# Patient Record
Sex: Female | Born: 1945
Health system: Southern US, Community
[De-identification: ages and names within clinical notes are randomized; demographics above are authoritative.]

## PROBLEM LIST (undated history)

## (undated) DIAGNOSIS — C50919 Malignant neoplasm of unspecified site of unspecified female breast: Secondary | ICD-10-CM

## (undated) DIAGNOSIS — K5730 Diverticulosis of large intestine without perforation or abscess without bleeding: Secondary | ICD-10-CM

## (undated) DIAGNOSIS — I480 Paroxysmal atrial fibrillation: Secondary | ICD-10-CM

## (undated) DIAGNOSIS — I351 Nonrheumatic aortic (valve) insufficiency: Secondary | ICD-10-CM

## (undated) DIAGNOSIS — F419 Anxiety disorder, unspecified: Secondary | ICD-10-CM

## (undated) DIAGNOSIS — M8580 Other specified disorders of bone density and structure, unspecified site: Secondary | ICD-10-CM

## (undated) DIAGNOSIS — L259 Unspecified contact dermatitis, unspecified cause: Secondary | ICD-10-CM

## (undated) DIAGNOSIS — E785 Hyperlipidemia, unspecified: Secondary | ICD-10-CM

## (undated) DIAGNOSIS — I499 Cardiac arrhythmia, unspecified: Secondary | ICD-10-CM

## (undated) DIAGNOSIS — Z5181 Encounter for therapeutic drug level monitoring: Secondary | ICD-10-CM

## (undated) DIAGNOSIS — M1990 Unspecified osteoarthritis, unspecified site: Secondary | ICD-10-CM

## (undated) DIAGNOSIS — Z860100 Personal history of colon polyps, unspecified: Secondary | ICD-10-CM

## (undated) DIAGNOSIS — Z79899 Other long term (current) drug therapy: Secondary | ICD-10-CM

## (undated) DIAGNOSIS — Z923 Personal history of irradiation: Secondary | ICD-10-CM

## (undated) DIAGNOSIS — H3530 Unspecified macular degeneration: Secondary | ICD-10-CM

## (undated) DIAGNOSIS — K219 Gastro-esophageal reflux disease without esophagitis: Secondary | ICD-10-CM

## (undated) HISTORY — DX: Unspecified osteoarthritis, unspecified site: M19.90

## (undated) HISTORY — DX: Other specified disorders of bone density and structure, unspecified site: M85.80

## (undated) HISTORY — DX: Anxiety disorder, unspecified: F41.9

## (undated) HISTORY — DX: Paroxysmal atrial fibrillation: I48.0

## (undated) HISTORY — DX: Diverticulosis of large intestine without perforation or abscess without bleeding: K57.30

## (undated) HISTORY — DX: Personal history of colon polyps, unspecified: Z86.0100

## (undated) HISTORY — DX: Unspecified macular degeneration: H35.30

## (undated) HISTORY — DX: Gastro-esophageal reflux disease without esophagitis: K21.9

## (undated) HISTORY — DX: Unspecified contact dermatitis, unspecified cause: L25.9

## (undated) HISTORY — DX: Nonrheumatic aortic (valve) insufficiency: I35.1

## (undated) HISTORY — PX: OTHER SURGICAL HISTORY: SHX169

## (undated) HISTORY — DX: Hyperlipidemia, unspecified: E78.5

## (undated) HISTORY — DX: Malignant neoplasm of unspecified site of unspecified female breast: C50.919

## (undated) HISTORY — DX: Personal history of colonic polyps: Z86.010

---

## 1982-12-06 HISTORY — PX: ABDOMINAL HYSTERECTOMY: SHX81

## 1990-01-02 ENCOUNTER — Encounter: Payer: Self-pay | Admitting: Gastroenterology

## 1998-10-02 ENCOUNTER — Encounter: Admission: RE | Admit: 1998-10-02 | Discharge: 1998-10-02 | Payer: Self-pay | Admitting: *Deleted

## 1999-08-12 ENCOUNTER — Encounter (INDEPENDENT_AMBULATORY_CARE_PROVIDER_SITE_OTHER): Payer: Self-pay

## 1999-08-12 ENCOUNTER — Other Ambulatory Visit: Admission: RE | Admit: 1999-08-12 | Discharge: 1999-08-12 | Payer: Self-pay | Admitting: Obstetrics and Gynecology

## 2000-07-06 ENCOUNTER — Encounter: Payer: Self-pay | Admitting: Obstetrics and Gynecology

## 2000-07-06 ENCOUNTER — Encounter: Admission: RE | Admit: 2000-07-06 | Discharge: 2000-07-06 | Payer: Self-pay | Admitting: Obstetrics and Gynecology

## 2002-01-17 ENCOUNTER — Other Ambulatory Visit: Admission: RE | Admit: 2002-01-17 | Discharge: 2002-01-17 | Payer: Self-pay | Admitting: Obstetrics and Gynecology

## 2005-04-22 ENCOUNTER — Other Ambulatory Visit: Admission: RE | Admit: 2005-04-22 | Discharge: 2005-04-22 | Payer: Self-pay | Admitting: Obstetrics and Gynecology

## 2005-10-12 ENCOUNTER — Ambulatory Visit: Payer: Self-pay | Admitting: Pulmonary Disease

## 2006-01-19 ENCOUNTER — Ambulatory Visit (HOSPITAL_COMMUNITY): Admission: RE | Admit: 2006-01-19 | Discharge: 2006-01-19 | Payer: Self-pay | Admitting: Obstetrics and Gynecology

## 2006-04-22 ENCOUNTER — Ambulatory Visit: Payer: Self-pay | Admitting: Pulmonary Disease

## 2006-04-27 ENCOUNTER — Encounter: Payer: Self-pay | Admitting: Internal Medicine

## 2006-04-27 ENCOUNTER — Ambulatory Visit: Payer: Self-pay

## 2006-05-11 ENCOUNTER — Ambulatory Visit: Payer: Self-pay | Admitting: Pulmonary Disease

## 2006-05-11 ENCOUNTER — Ambulatory Visit: Payer: Self-pay | Admitting: Internal Medicine

## 2006-12-06 DIAGNOSIS — C50919 Malignant neoplasm of unspecified site of unspecified female breast: Secondary | ICD-10-CM

## 2006-12-06 DIAGNOSIS — Z923 Personal history of irradiation: Secondary | ICD-10-CM

## 2006-12-06 HISTORY — DX: Personal history of irradiation: Z92.3

## 2006-12-06 HISTORY — DX: Malignant neoplasm of unspecified site of unspecified female breast: C50.919

## 2007-01-26 ENCOUNTER — Ambulatory Visit: Payer: Self-pay | Admitting: Pulmonary Disease

## 2007-06-01 ENCOUNTER — Ambulatory Visit: Payer: Self-pay | Admitting: Pulmonary Disease

## 2007-06-01 LAB — CONVERTED CEMR LAB
ALT: 18 units/L (ref 0–35)
Basophils Relative: 0.1 % (ref 0.0–1.0)
Bilirubin, Direct: 0.1 mg/dL (ref 0.0–0.3)
CO2: 31 meq/L (ref 19–32)
Eosinophils Relative: 0.3 % (ref 0.0–5.0)
GFR calc Af Amer: 110 mL/min
Glucose, Bld: 97 mg/dL (ref 70–99)
HCT: 36.7 % (ref 36.0–46.0)
Hemoglobin: 12.4 g/dL (ref 12.0–15.0)
Lymphocytes Relative: 34.4 % (ref 12.0–46.0)
Monocytes Absolute: 0.7 10*3/uL (ref 0.2–0.7)
Neutro Abs: 3.5 10*3/uL (ref 1.4–7.7)
Potassium: 4.4 meq/L (ref 3.5–5.1)
Total Bilirubin: 0.9 mg/dL (ref 0.3–1.2)
Total Protein: 6.8 g/dL (ref 6.0–8.3)
VLDL: 17 mg/dL (ref 0–40)
Vit D, 1,25-Dihydroxy: 60 — ABNORMAL HIGH (ref 20–57)
WBC: 6.4 10*3/uL (ref 4.5–10.5)

## 2007-06-05 ENCOUNTER — Ambulatory Visit: Payer: Self-pay | Admitting: Pulmonary Disease

## 2007-08-07 HISTORY — PX: BREAST LUMPECTOMY: SHX2

## 2007-09-05 ENCOUNTER — Encounter (INDEPENDENT_AMBULATORY_CARE_PROVIDER_SITE_OTHER): Payer: Self-pay | Admitting: Diagnostic Radiology

## 2007-09-05 ENCOUNTER — Encounter: Admission: RE | Admit: 2007-09-05 | Discharge: 2007-09-05 | Payer: Self-pay | Admitting: Obstetrics and Gynecology

## 2007-09-13 ENCOUNTER — Encounter: Admission: RE | Admit: 2007-09-13 | Discharge: 2007-09-13 | Payer: Self-pay | Admitting: Obstetrics and Gynecology

## 2007-10-05 ENCOUNTER — Ambulatory Visit: Admission: RE | Admit: 2007-10-05 | Discharge: 2007-12-05 | Payer: Self-pay | Admitting: Radiation Oncology

## 2007-11-29 ENCOUNTER — Encounter: Payer: Self-pay | Admitting: Pulmonary Disease

## 2007-12-27 ENCOUNTER — Ambulatory Visit: Payer: Self-pay | Admitting: Oncology

## 2007-12-27 ENCOUNTER — Encounter: Payer: Self-pay | Admitting: Pulmonary Disease

## 2007-12-27 LAB — CBC WITH DIFFERENTIAL/PLATELET
BASO%: 0.4 % (ref 0.0–2.0)
Basophils Absolute: 0 10*3/uL (ref 0.0–0.1)
HCT: 35.1 % (ref 34.8–46.6)
HGB: 12.2 g/dL (ref 11.6–15.9)
MCHC: 34.8 g/dL (ref 32.0–36.0)
MONO#: 0.6 10*3/uL (ref 0.1–0.9)
NEUT%: 68.2 % (ref 39.6–76.8)
WBC: 5.7 10*3/uL (ref 3.9–10.0)
lymph#: 1.1 10*3/uL (ref 0.9–3.3)

## 2007-12-27 LAB — COMPREHENSIVE METABOLIC PANEL
ALT: 20 U/L (ref 0–35)
Albumin: 4.1 g/dL (ref 3.5–5.2)
CO2: 28 mEq/L (ref 19–32)
Calcium: 9.2 mg/dL (ref 8.4–10.5)
Chloride: 100 mEq/L (ref 96–112)
Creatinine, Ser: 0.62 mg/dL (ref 0.40–1.20)
Potassium: 3.6 mEq/L (ref 3.5–5.3)

## 2007-12-27 LAB — LACTATE DEHYDROGENASE: LDH: 140 U/L (ref 94–250)

## 2007-12-31 LAB — VITAMIN D PNL(25-HYDRXY+1,25-DIHY)-BLD
Vit D, 1,25-Dihydroxy: 52 pg/mL (ref 6–62)
Vit D, 25-Hydroxy: 32 ng/mL (ref 30–89)

## 2008-01-03 ENCOUNTER — Encounter: Payer: Self-pay | Admitting: Pulmonary Disease

## 2008-01-16 ENCOUNTER — Ambulatory Visit: Payer: Self-pay | Admitting: Gastroenterology

## 2008-01-26 ENCOUNTER — Ambulatory Visit: Payer: Self-pay | Admitting: Gastroenterology

## 2008-01-26 ENCOUNTER — Encounter: Payer: Self-pay | Admitting: Gastroenterology

## 2008-01-26 ENCOUNTER — Encounter: Payer: Self-pay | Admitting: Pulmonary Disease

## 2008-02-23 ENCOUNTER — Ambulatory Visit: Payer: Self-pay | Admitting: Oncology

## 2008-02-28 ENCOUNTER — Encounter: Payer: Self-pay | Admitting: Pulmonary Disease

## 2008-05-02 ENCOUNTER — Ambulatory Visit: Payer: Self-pay | Admitting: Oncology

## 2008-05-06 ENCOUNTER — Encounter: Payer: Self-pay | Admitting: Pulmonary Disease

## 2008-05-06 LAB — COMPREHENSIVE METABOLIC PANEL
AST: 17 U/L (ref 0–37)
Alkaline Phosphatase: 60 U/L (ref 39–117)
BUN: 12 mg/dL (ref 6–23)
Creatinine, Ser: 1.09 mg/dL (ref 0.40–1.20)
Glucose, Bld: 103 mg/dL — ABNORMAL HIGH (ref 70–99)
Total Bilirubin: 0.3 mg/dL (ref 0.3–1.2)

## 2008-05-06 LAB — CBC WITH DIFFERENTIAL/PLATELET
Basophils Absolute: 0 10*3/uL (ref 0.0–0.1)
EOS%: 0.2 % (ref 0.0–7.0)
HCT: 34.3 % — ABNORMAL LOW (ref 34.8–46.6)
HGB: 12.1 g/dL (ref 11.6–15.9)
LYMPH%: 25.7 % (ref 14.0–48.0)
MCH: 32.8 pg (ref 26.0–34.0)
MCV: 92.9 fL (ref 81.0–101.0)
MONO%: 10.9 % (ref 0.0–13.0)
NEUT%: 62.8 % (ref 39.6–76.8)
Platelets: 248 10*3/uL (ref 145–400)
RDW: 12.1 % (ref 11.3–14.5)

## 2008-05-06 LAB — CANCER ANTIGEN 27.29: CA 27.29: 21 U/mL (ref 0–39)

## 2008-05-08 ENCOUNTER — Encounter: Payer: Self-pay | Admitting: Pulmonary Disease

## 2008-06-21 ENCOUNTER — Ambulatory Visit: Payer: Self-pay | Admitting: Internal Medicine

## 2008-06-21 ENCOUNTER — Encounter: Payer: Self-pay | Admitting: Pulmonary Disease

## 2008-07-04 ENCOUNTER — Ambulatory Visit: Payer: Self-pay | Admitting: Internal Medicine

## 2008-07-04 DIAGNOSIS — L259 Unspecified contact dermatitis, unspecified cause: Secondary | ICD-10-CM

## 2008-07-04 DIAGNOSIS — J309 Allergic rhinitis, unspecified: Secondary | ICD-10-CM | POA: Insufficient documentation

## 2008-07-04 DIAGNOSIS — F411 Generalized anxiety disorder: Secondary | ICD-10-CM

## 2008-07-04 DIAGNOSIS — E785 Hyperlipidemia, unspecified: Secondary | ICD-10-CM | POA: Insufficient documentation

## 2008-07-04 DIAGNOSIS — K219 Gastro-esophageal reflux disease without esophagitis: Secondary | ICD-10-CM

## 2008-07-04 DIAGNOSIS — M8580 Other specified disorders of bone density and structure, unspecified site: Secondary | ICD-10-CM

## 2008-07-10 ENCOUNTER — Telehealth: Payer: Self-pay | Admitting: Pulmonary Disease

## 2008-09-06 ENCOUNTER — Ambulatory Visit: Payer: Self-pay | Admitting: Pulmonary Disease

## 2008-09-07 DIAGNOSIS — C50919 Malignant neoplasm of unspecified site of unspecified female breast: Secondary | ICD-10-CM

## 2008-09-07 DIAGNOSIS — K5730 Diverticulosis of large intestine without perforation or abscess without bleeding: Secondary | ICD-10-CM | POA: Insufficient documentation

## 2008-09-07 DIAGNOSIS — I359 Nonrheumatic aortic valve disorder, unspecified: Secondary | ICD-10-CM | POA: Insufficient documentation

## 2008-09-07 DIAGNOSIS — M1990 Unspecified osteoarthritis, unspecified site: Secondary | ICD-10-CM | POA: Insufficient documentation

## 2008-09-17 ENCOUNTER — Encounter: Payer: Self-pay | Admitting: Pulmonary Disease

## 2008-09-18 ENCOUNTER — Encounter: Payer: Self-pay | Admitting: Pulmonary Disease

## 2008-10-11 ENCOUNTER — Ambulatory Visit: Payer: Self-pay | Admitting: Oncology

## 2008-10-15 ENCOUNTER — Encounter: Payer: Self-pay | Admitting: Pulmonary Disease

## 2008-10-15 LAB — CBC WITH DIFFERENTIAL/PLATELET
BASO%: 0.2 % (ref 0.0–2.0)
HCT: 33.6 % — ABNORMAL LOW (ref 34.8–46.6)
MCHC: 34.4 g/dL (ref 32.0–36.0)
MONO#: 0.7 10*3/uL (ref 0.1–0.9)
NEUT%: 60.4 % (ref 39.6–76.8)
RBC: 3.54 10*6/uL — ABNORMAL LOW (ref 3.70–5.32)
RDW: 12.5 % (ref 11.3–14.5)
WBC: 5.7 10*3/uL (ref 3.9–10.0)
lymph#: 1.5 10*3/uL (ref 0.9–3.3)

## 2008-10-15 LAB — CANCER ANTIGEN 27.29: CA 27.29: 20 U/mL (ref 0–39)

## 2008-10-16 LAB — COMPREHENSIVE METABOLIC PANEL
ALT: 19 U/L (ref 0–35)
CO2: 30 mEq/L (ref 19–32)
Creatinine, Ser: 1.03 mg/dL (ref 0.40–1.20)
Total Bilirubin: 0.6 mg/dL (ref 0.3–1.2)

## 2008-11-01 LAB — BASIC METABOLIC PANEL
CO2: 28 mEq/L (ref 19–32)
Chloride: 103 mEq/L (ref 96–112)
Creatinine, Ser: 0.63 mg/dL (ref 0.40–1.20)
Potassium: 4 mEq/L (ref 3.5–5.3)

## 2009-05-02 ENCOUNTER — Ambulatory Visit: Payer: Self-pay | Admitting: Oncology

## 2009-05-07 ENCOUNTER — Encounter: Payer: Self-pay | Admitting: Gastroenterology

## 2009-05-07 LAB — COMPREHENSIVE METABOLIC PANEL
CO2: 33 mEq/L — ABNORMAL HIGH (ref 19–32)
Glucose, Bld: 94 mg/dL (ref 70–99)
Sodium: 138 mEq/L (ref 135–145)
Total Bilirubin: 0.6 mg/dL (ref 0.3–1.2)
Total Protein: 7.2 g/dL (ref 6.0–8.3)

## 2009-05-07 LAB — CBC WITH DIFFERENTIAL/PLATELET
Eosinophils Absolute: 0 10*3/uL (ref 0.0–0.5)
HCT: 35.7 % (ref 34.8–46.6)
HGB: 12.4 g/dL (ref 11.6–15.9)
LYMPH%: 30.8 % (ref 14.0–49.7)
MONO#: 0.7 10*3/uL (ref 0.1–0.9)
NEUT#: 3.4 10*3/uL (ref 1.5–6.5)
NEUT%: 57.4 % (ref 38.4–76.8)
Platelets: 241 10*3/uL (ref 145–400)
RBC: 3.76 10*6/uL (ref 3.70–5.45)
WBC: 6 10*3/uL (ref 3.9–10.3)

## 2009-05-07 LAB — CANCER ANTIGEN 27.29: CA 27.29: 24 U/mL (ref 0–39)

## 2009-08-04 DIAGNOSIS — Z860100 Personal history of colon polyps, unspecified: Secondary | ICD-10-CM | POA: Insufficient documentation

## 2009-08-07 ENCOUNTER — Ambulatory Visit: Payer: Self-pay | Admitting: Gastroenterology

## 2009-08-07 DIAGNOSIS — K625 Hemorrhage of anus and rectum: Secondary | ICD-10-CM | POA: Insufficient documentation

## 2009-09-22 ENCOUNTER — Ambulatory Visit: Payer: Self-pay | Admitting: Oncology

## 2009-09-24 ENCOUNTER — Ambulatory Visit (HOSPITAL_COMMUNITY): Admission: RE | Admit: 2009-09-24 | Discharge: 2009-09-24 | Payer: Self-pay | Admitting: Oncology

## 2009-11-04 ENCOUNTER — Ambulatory Visit: Payer: Self-pay | Admitting: Oncology

## 2009-11-06 ENCOUNTER — Encounter: Payer: Self-pay | Admitting: Gastroenterology

## 2009-11-17 ENCOUNTER — Telehealth: Payer: Self-pay | Admitting: Pulmonary Disease

## 2010-01-27 ENCOUNTER — Ambulatory Visit: Payer: Self-pay | Admitting: Pulmonary Disease

## 2010-01-28 ENCOUNTER — Ambulatory Visit: Payer: Self-pay | Admitting: Pulmonary Disease

## 2010-01-29 LAB — CONVERTED CEMR LAB
AST: 26 units/L (ref 0–37)
Albumin: 3.9 g/dL (ref 3.5–5.2)
Alkaline Phosphatase: 67 units/L (ref 39–117)
BUN: 11 mg/dL (ref 6–23)
Basophils Absolute: 0 10*3/uL (ref 0.0–0.1)
Bilirubin, Direct: 0.2 mg/dL (ref 0.0–0.3)
Calcium: 9.5 mg/dL (ref 8.4–10.5)
Cholesterol: 162 mg/dL (ref 0–200)
Creatinine, Ser: 0.7 mg/dL (ref 0.4–1.2)
Eosinophils Absolute: 0 10*3/uL (ref 0.0–0.7)
HCT: 37 % (ref 36.0–46.0)
Hemoglobin: 12.4 g/dL (ref 12.0–15.0)
LDL Cholesterol: 72 mg/dL (ref 0–99)
Lymphocytes Relative: 27 % (ref 12.0–46.0)
Lymphs Abs: 1.3 10*3/uL (ref 0.7–4.0)
MCHC: 33.5 g/dL (ref 30.0–36.0)
Monocytes Relative: 10.5 % (ref 3.0–12.0)
Neutro Abs: 3 10*3/uL (ref 1.4–7.7)
Platelets: 215 10*3/uL (ref 150.0–400.0)
RDW: 11.5 % (ref 11.5–14.6)
TSH: 1.34 microintl units/mL (ref 0.35–5.50)
Total Protein: 7.1 g/dL (ref 6.0–8.3)
Triglycerides: 89 mg/dL (ref 0.0–149.0)

## 2010-10-14 ENCOUNTER — Encounter: Payer: Self-pay | Admitting: Pulmonary Disease

## 2010-10-16 ENCOUNTER — Ambulatory Visit: Payer: Self-pay | Admitting: Oncology

## 2010-10-20 ENCOUNTER — Encounter: Payer: Self-pay | Admitting: Gastroenterology

## 2010-10-20 LAB — CBC WITH DIFFERENTIAL/PLATELET
BASO%: 0.3 % (ref 0.0–2.0)
EOS%: 0.1 % (ref 0.0–7.0)
LYMPH%: 34.6 % (ref 14.0–49.7)
MCH: 33 pg (ref 25.1–34.0)
MCHC: 34.8 g/dL (ref 31.5–36.0)
MCV: 95 fL (ref 79.5–101.0)
MONO%: 10.7 % (ref 0.0–14.0)
NEUT#: 2.6 10*3/uL (ref 1.5–6.5)
Platelets: 254 10*3/uL (ref 145–400)
RBC: 3.72 10*6/uL (ref 3.70–5.45)
RDW: 12.6 % (ref 11.2–14.5)

## 2010-10-20 LAB — COMPREHENSIVE METABOLIC PANEL
ALT: 16 U/L (ref 0–35)
AST: 20 U/L (ref 0–37)
Albumin: 4.2 g/dL (ref 3.5–5.2)
Alkaline Phosphatase: 64 U/L (ref 39–117)
Glucose, Bld: 126 mg/dL — ABNORMAL HIGH (ref 70–99)
Potassium: 4.1 mEq/L (ref 3.5–5.3)
Sodium: 140 mEq/L (ref 135–145)
Total Bilirubin: 0.5 mg/dL (ref 0.3–1.2)
Total Protein: 6.5 g/dL (ref 6.0–8.3)

## 2010-12-27 ENCOUNTER — Encounter: Payer: Self-pay | Admitting: Surgery

## 2011-01-03 LAB — CONVERTED CEMR LAB
ALT: 22 units/L (ref 0–35)
AST: 24 units/L (ref 0–37)
Albumin: 3.9 g/dL (ref 3.5–5.2)
Alkaline Phosphatase: 52 units/L (ref 39–117)
BUN: 17 mg/dL (ref 6–23)
Basophils Relative: 0.5 % (ref 0.0–3.0)
CO2: 31 meq/L (ref 19–32)
Chloride: 105 meq/L (ref 96–112)
Creatinine, Ser: 0.7 mg/dL (ref 0.4–1.2)
Eosinophils Absolute: 0 10*3/uL (ref 0.0–0.7)
Eosinophils Relative: 0.1 % (ref 0.0–5.0)
Glucose, Bld: 101 mg/dL — ABNORMAL HIGH (ref 70–99)
Lymphocytes Relative: 26 % (ref 12.0–46.0)
MCV: 96.3 fL (ref 78.0–100.0)
Monocytes Relative: 10.9 % (ref 3.0–12.0)
Neutrophils Relative %: 62.5 % (ref 43.0–77.0)
Potassium: 4.6 meq/L (ref 3.5–5.1)
RBC: 3.72 M/uL — ABNORMAL LOW (ref 3.87–5.11)
TSH: 1.36 microintl units/mL (ref 0.35–5.50)
Total CHOL/HDL Ratio: 2.4
Total Protein: 6.6 g/dL (ref 6.0–8.3)
VLDL: 13 mg/dL (ref 0–40)
WBC: 6.5 10*3/uL (ref 4.5–10.5)
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
## 2011-01-05 NOTE — Letter (Signed)
Summary: [facility]  [facility]   Imported By: Lester [place] 11/04/2010 08:29:07  _____________________________________________________________________  External Attachment:    Type:   Image     Comment:   External Document

## 2011-01-27 ENCOUNTER — Ambulatory Visit: Payer: Self-pay | Admitting: Pulmonary Disease

## 2011-01-29 ENCOUNTER — Other Ambulatory Visit: Payer: Self-pay | Admitting: Pulmonary Disease

## 2011-01-29 ENCOUNTER — Encounter (INDEPENDENT_AMBULATORY_CARE_PROVIDER_SITE_OTHER): Payer: Self-pay | Admitting: *Deleted

## 2011-01-29 ENCOUNTER — Ambulatory Visit: Payer: Self-pay | Admitting: Pulmonary Disease

## 2011-01-29 ENCOUNTER — Other Ambulatory Visit: Payer: PRIVATE HEALTH INSURANCE

## 2011-01-29 DIAGNOSIS — Z0000 Encounter for general adult medical examination without abnormal findings: Secondary | ICD-10-CM

## 2011-01-29 LAB — HEPATIC FUNCTION PANEL
ALT: 18 U/L (ref 0–35)
Alkaline Phosphatase: 66 U/L (ref 39–117)
Bilirubin, Direct: 0.1 mg/dL (ref 0.0–0.3)
Total Protein: 6.9 g/dL (ref 6.0–8.3)

## 2011-01-29 LAB — CBC WITH DIFFERENTIAL/PLATELET
Basophils Absolute: 0 10*3/uL (ref 0.0–0.1)
Basophils Relative: 0.7 % (ref 0.0–3.0)
Eosinophils Absolute: 0 10*3/uL (ref 0.0–0.7)
MCHC: 34.7 g/dL (ref 30.0–36.0)
MCV: 97.6 fl (ref 78.0–100.0)
Monocytes Absolute: 0.7 10*3/uL (ref 0.1–1.0)
Neutrophils Relative %: 51.1 % (ref 43.0–77.0)
RBC: 3.96 Mil/uL (ref 3.87–5.11)
RDW: 12.5 % (ref 11.5–14.6)

## 2011-01-29 LAB — URINALYSIS
Bilirubin Urine: NEGATIVE
Hgb urine dipstick: NEGATIVE
Total Protein, Urine: NEGATIVE
Urine Glucose: NEGATIVE

## 2011-01-29 LAB — BASIC METABOLIC PANEL
CO2: 32 mEq/L (ref 19–32)
Chloride: 106 mEq/L (ref 96–112)
Creatinine, Ser: 0.7 mg/dL (ref 0.4–1.2)
Glucose, Bld: 89 mg/dL (ref 70–99)
Sodium: 144 mEq/L (ref 135–145)

## 2011-01-29 LAB — LIPID PANEL
Total CHOL/HDL Ratio: 2
Triglycerides: 66 mg/dL (ref 0.0–149.0)

## 2011-02-02 ENCOUNTER — Encounter: Payer: Self-pay | Admitting: Cardiology

## 2011-02-10 ENCOUNTER — Ambulatory Visit (INDEPENDENT_AMBULATORY_CARE_PROVIDER_SITE_OTHER): Payer: PRIVATE HEALTH INSURANCE | Admitting: Pulmonary Disease

## 2011-02-10 ENCOUNTER — Encounter: Payer: Self-pay | Admitting: Pulmonary Disease

## 2011-02-10 DIAGNOSIS — I4891 Unspecified atrial fibrillation: Secondary | ICD-10-CM | POA: Insufficient documentation

## 2011-02-10 DIAGNOSIS — Z0000 Encounter for general adult medical examination without abnormal findings: Secondary | ICD-10-CM

## 2011-02-12 ENCOUNTER — Encounter: Payer: Self-pay | Admitting: Cardiology

## 2011-02-12 ENCOUNTER — Institutional Professional Consult (permissible substitution) (INDEPENDENT_AMBULATORY_CARE_PROVIDER_SITE_OTHER): Payer: PRIVATE HEALTH INSURANCE | Admitting: Cardiology

## 2011-02-12 DIAGNOSIS — I4891 Unspecified atrial fibrillation: Secondary | ICD-10-CM

## 2011-02-15 ENCOUNTER — Telehealth (INDEPENDENT_AMBULATORY_CARE_PROVIDER_SITE_OTHER): Payer: Self-pay | Admitting: *Deleted

## 2011-02-16 NOTE — Assessment & Plan Note (Signed)
Summary: consult: pas. per libby from [facility].notes on emr. caroli...   Visit Type:  Initial Consult Referring Provider:  n/a Primary Provider:  Lorin Picket Nadel,MD  CC:  Atrial Fibrillation.  History of Present Illness: The patient presents for evaluation of atrial fibrillation. She was hospitalized recently apparently [facility] with this. I have none of these records..  She reports having a a stress perfusion study which she says was negative. She apparently spontaneously converted to sinus rhythm. She denies any presyncope or syncope. She never had chest discomfort, neck or arm discomfort. Of note for several days prior to this she was having GI complaints and diarrhea.    Otherwise she has not had a cardiac history. She is active. She did have an echocardiogram in 2007 and there was trivial mitral regurgitation noted.  Current Medications (verified): 1)  Bayer Aspirin Ec Low Dose 81 Mg  Tbec (Aspirin) .... Take 1 Tablet By Mouth Once A Day 2)  Metoprolol Tartrate 25 Mg Tabs (Metoprolol Tartrate) .... 1/2 Tablet Twice Daily 3)  Simvastatin 20 Mg Tabs (Simvastatin) .... Take 1 Tablet By Mouth Once A Day At Bedtime.Marland KitchenMarland Kitchen 4)  Fish Oil   Oil (Fish Oil) .... Take 1 Capsule By Mouth Twice A Day 5)  Prilosec 20 Mg Cpdr (Omeprazole) .... Take 1 Tab By Mouth Once Daily As Needed For Acid Reflux.Marland KitchenMarland Kitchen 6)  Centrum Silver   Tabs (Multiple Vitamins-Minerals) .... Take 1 Tablet By Mouth Once A Day 7)  Vitamin D3 1000 Unit Caps (Cholecalciferol) .... Take 1 Cap By Mouth Once Daily.Marland KitchenMarland Kitchen 8)  Gabapentin 300 Mg Caps (Gabapentin) .... Take One Capsule By Mouth Once Daily As Needed For Hot Flashes  Allergies (verified): 1)  ! Codeine  Past History:  Past Medical History: ALLERGIC RHINITIS (ICD-477.9) AORTIC INSUFFICIENCY, MILD (ICD-424.1) HYPERLIPIDEMIA (ICD-272.4) GERD (ICD-530.81) DIVERTICULOSIS OF COLON (ICD-562.10) COLONIC POLYPS, HYPERPLASTIC, HX OF (ICD-V12.72) ADENOCARCINOMA, BREAST  (ICD-174.9) DEGENERATIVE JOINT DISEASE (ICD-715.90) OSTEOPENIA (ICD-733.90) ANXIETY (ICD-300.00) CONTACT DERMATITIS (ICD-692.9)  Past Surgical History: Hysterectomy and 1 ovary in 1984 Lleft breast lumpectomy & node bx 9/08  Family History: Reviewed history from 08/07/2009 and no changes required. Father deceased age 30 from heart attack Mother deceased age 65 from blood disorder, valve replaced 3 Siblings-  1 Sister died w/ lymphoma at age 72 1 Sister w/ HBP 1 Brother in good general health No FH of Colon Cancer:  Social History: Reviewed history from 09/06/2008 and no changes required. married 1 son - Fredrik Cove former smoker--quit smoking in 1979 exercises--3 times per week no caffeine use no etoh  Review of Systems       As stated in the HPI and negative for all other systems.   Vital Signs:  Patient profile:   65 year old female Height:      63 inches Weight:      119 pounds BMI:     21.16 Pulse rate:   60 / minute Resp:     16 per minute BP sitting:   123 / 75  (right arm)  Vitals Entered By: Marrion Coy, CNA (February 12, 2011 3:18 PM)  Physical Exam  General:  Well developed, well nourished, no acute distress.healthy appearing.   Head:  Normocephalic and atraumatic. Eyes:  PERRLA/EOM intact; conjunctiva and lids normal. Mouth:  Teeth, gums and palate normal. Oral mucosa normal. Neck:  Neck supple, no JVD. No masses, thyromegaly or abnormal cervical nodes. Chest Wall:  no deformities or breast masses noted Lungs:  Clear bilaterally to auscultation and  percussion. Abdomen:  Bowel sounds positive; abdomen soft and non-tender without masses, organomegaly, or hernias noted. No hepatosplenomegaly. Msk:  Back normal, normal gait. Muscle strength and tone normal. Extremities:  No clubbing or cyanosis. Neurologic:  Alert and oriented x 3. Skin:  Intact without lesions or rashes. Cervical Nodes:  no significant adenopathy Inguinal Nodes:  no significant  adenopathy Psych:  Normal affect.   Detailed Cardiovascular Exam  Neck    Carotids: Carotids full and equal bilaterally without bruits.      Neck Veins: Normal, no JVD.    Heart    Inspection: no deformities or lifts noted.      Palpation: normal PMI with no thrills palpable.      Auscultation: S1 and S2 within normal limits, no S3, no S4, questionable click, no murmurs  Vascular    Abdominal Aorta: no palpable masses, pulsations, or audible bruits.      Femoral Pulses: normal femoral pulses bilaterally.      Pedal Pulses: normal pedal pulses bilaterally.      Radial Pulses: normal radial pulses bilaterally.      Peripheral Circulation: no clubbing, cyanosis, or edema noted with normal capillary refill.     EKG  Procedure date:  02/12/2011  Findings:      Sinus rhythm, rate 60, axis within normal limits, intervals within normal limits, poor anterior R-wave progression  Impression & Recommendations:  Problem # 1:  PAROXYSMAL ATRIAL FIBRILLATION (ICD-427.31) I suspect this could to her GI illness. She has low thromboembolic risk for anticoagulation would not be needed. I will check an echocardiogram given the mitral valve regurgitation previously and possible click. However, no further treatment will be necessary unless she has recurrent symptoms. Orders: EKG w/ Interpretation (93000) Echocardiogram (Echo)  Problem # 2:  HYPERLIPIDEMIA (ICD-272.4) Per Dr. Kriste Basque.  I will not change her Zocor.  Patient Instructions: 1)  Your physician recommends that you continue on your current medications as directed. Please refer to the Current Medication list given to you today. 2)  Your physician has requested that you have an echocardiogram.  Echocardiography is a painless test that uses sound waves to create images of your heart. It provides your doctor with information about the size and shape of your heart and how well your heart's chambers and valves are working.  This procedure takes  approximately one hour. There are no restrictions for this procedure.

## 2011-02-23 NOTE — Progress Notes (Signed)
  ROI Faxed to [facility]. [facility]  February 15, 2011 9:13 AM     Appended Document:  Records Received from [facility] gave to [facility]
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
## 2011-03-04 ENCOUNTER — Other Ambulatory Visit (HOSPITAL_COMMUNITY): Payer: Self-pay | Admitting: Radiology

## 2011-03-04 DIAGNOSIS — I4891 Unspecified atrial fibrillation: Secondary | ICD-10-CM

## 2011-03-05 ENCOUNTER — Ambulatory Visit (HOSPITAL_COMMUNITY): Payer: PRIVATE HEALTH INSURANCE | Attending: Cardiology | Admitting: Radiology

## 2011-03-05 DIAGNOSIS — I4891 Unspecified atrial fibrillation: Secondary | ICD-10-CM | POA: Insufficient documentation

## 2011-03-25 ENCOUNTER — Institutional Professional Consult (permissible substitution): Payer: PRIVATE HEALTH INSURANCE | Admitting: Cardiovascular Disease

## 2011-03-26 ENCOUNTER — Institutional Professional Consult (permissible substitution): Payer: PRIVATE HEALTH INSURANCE | Admitting: Cardiovascular Disease

## 2011-04-05 ENCOUNTER — Encounter: Payer: Self-pay | Admitting: Cardiology

## 2011-04-05 ENCOUNTER — Other Ambulatory Visit: Payer: Self-pay | Admitting: Pulmonary Disease

## 2011-04-07 ENCOUNTER — Encounter: Payer: Self-pay | Admitting: Cardiology

## 2011-04-20 NOTE — Assessment & Plan Note (Signed)
[facility]                             PULMONARY OFFICE NOTE   NAME:Vicki [name], Vicki [name]                    MRN:          [id number]  DATE:06/05/2007                            DOB:          March 13, 1946    HISTORY OF PRESENT ILLNESS:  The patient is a 65 year old white female,  patient of Dr. Jodelle Green who has a known history of gastroesophageal  reflux, hyperlipidemia, osteoporosis, presents today for an acute office  visit.  The patient complains that over the last 4 days she has had  intermittent episode in which she felt extremely lightheaded.  Symptoms  seem to be aggravated when she turns her head from side to side, tries  to get up from a sitting to standing position and turns over in the bed.  The patient did take a meclizine tablet of her husband's with some  improvement in symptoms.  The patient is leaving on vacation soon and is  concerned about her dizziness persisting.  The patient denies any  associated presyncopal, syncopal episodes, headache, visual changes,  extremity, weakness, vomiting, palpitations or chest pain.   PAST MEDICAL HISTORY:  Was reviewed.   CURRENT MEDICATIONS:  Reviewed.   PHYSICAL EXAMINATION:  The patient is a pleasant female in no acute  distress.  She is afebrile with stable vital signs.  O2 saturation is 96% on room  air.  HEENT:  Unremarkable.  NECK:  Supple without cervical adenopathy. No JVD.  LUNGS:  Sounds are clear.  CARDIAC:  S1, S2 without murmur, rub or gallop.  ABDOMEN:  Soft and nontender.  No palpable hepatosplenomegaly.  EXTREMITIES:  Warm without any calf tenderness, cyanosis, clubbing or  edema.  Moves all extremities well.  NEURO:  Alert and oriented x3.  Cranial nerves II through XII are  intact.  Negative tremor, negative pronator drift, negative Romberg.  Steady gait.  Head maneuvers are negative for reproducible symptoms and negative for  nystagmus.  EOMI without nystagmus and PERRLA.   DATA:  Lab work on June 01, 2007 revealed a hemoglobin of 12.4, BMET and  liver function tests were normal and TSH was also normal.   IMPRESSION AND PLAN:  Dizziness, suspect some benign positional vertigo.  The patient is recommended to change positions slowly.  Increase her  fluid intake. May take meclizine 25 mg  1/2 to a whole up to 3 times a day as needed.  Patient is advised if  symptoms do not improve or worsen she is to contact our office for  sooner followup.      Rubye Oaks, NP  Electronically Signed      Lonzo Cloud. Kriste Basque, MD  Electronically Signed   TP/MedQ  DD: 06/06/2007  DT: 06/07/2007  Job #: [id number]

## 2011-04-20 NOTE — Assessment & Plan Note (Signed)
[facility]                         GASTROENTEROLOGY OFFICE NOTE   NAME:Vicki [name], TAYELOR OSBORNE                    MRN:          [id number]  DATE:01/16/2008                            DOB:          Mar 18, 1946    Ms. 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 Kriste Basque and Magrinat for colonoscopy  screening.   Vedha has had recent evaluation of breast carcinoma with lumpectomy and  radiation therapy over the fall of 2008 and she currently is on  Tamoxifen therapy. She is referred for a screening colonoscopy and  really denies any GI complaints at the time having regular bowel  movements without melena, hematochezia, or abdominal pain.   She has a past history of ulcerative proctitis with a flexible  sigmoidoscopy some 10 years ago. She has not had a full diagnostic  colonoscopy. She has occasional indigestion relieved by taking over-the-  counter Prilosec but denies chronic GERD, dysphagia or any hepatobiliary  problems, anorexia or weight loss. She follows a regular diet, denies  any specific food intolerances.   PAST MEDICAL HISTORY:  Besides the breast carcinoma is remarkable for a  previous hysterectomy in 1987, an appendectomy also at that time. She  has osteopenia and hyperlipidemia and is followed by Dr. Kriste Basque.   MEDICATIONS:  1. Centrum silver daily.  2. Aspirin 81 mg a day.  3. Fosamax 70 mg a week.  4. Os-Cal.  5. Vitamin D 2 a day.  6. Glucosamine 2 a day.  7. Simvastatin 20 mg a day.  8. Fish oil 1000 mg a day.  9. Gabapentin 300 mg at bedtime.  10.Tamoxifen 20 mg a day.   She denies true drug allergies.   FAMILY HISTORY:  Noncontributory.  She does have a sister whose had T  cell lymphoma.   SOCIAL HISTORY:  She is married and lives with her husband. She has a  twelfth grade education. She does not smoke or use ethanol.   REVIEW OF SYSTEMS:  Remarkable for minor complaints such  as low back  pain, arthralgias, occasional night sweats and occasional myalgias. She  denies current cardiovascular, pulmonary, genitourinary, neurologic,  orthopedic, endocrine, or psychiatric problems. Review of systems  otherwise negative.   PHYSICAL EXAMINATION:  She is an attractive female appearing younger  than her stated age. She is in no acute distress. She is 5 feet 2 and  weighs 130 pounds.  Blood pressure is 110/62 and pulse was 84 and regular.  I did appreciate stigmata of chronic liver disease.  Her chest was entirely clear and she was in a regular rhythm without  murmur, gallop or rub.  There was no hepatosplenomegaly, abdominal masses or tenderness. Bowel  sounds were normal.  Rectal exam was deferred.  Peripheral extremities were unremarkable.  Mental status was clear.   ASSESSMENT:  1. Prior history of ulcerative proctitis without current activity. She      is due for screening colonoscopy exam to exclude colon polyps.  2. Minor gastroesophageal reflux disease handled with over-the-counter      Prilosec therapy on an as needed  basis.  3. History of mild osteopenia and hyperlipidemia.  4. Status post lumpectomy and radiation therapy for breast carcinoma      with ductal carcinoma grade 1.  5. History of nausea and vomiting with codeine use.   RECOMMENDATIONS:  I have gone ahead and scheduled Ms. Laughery for  outpatient full diagnostic colonoscopy at her convenience. We will hold  aspirin a week before her procedure. On reviewing her record, I see  where she did have a CT scan in February 2007 ordered by Dr. Duane Lope  which showed sigmoid colon diverticulosis and previous hysterectomy but  otherwise was normal.     Vania Rea. Jarold Motto, MD, Caleen Essex, FAGA  Electronically Signed    DRP/MedQ  DD: 01/16/2008  DT: 01/18/2008  Job #: [id number]   cc:   Lonzo Cloud. Kriste Basque, MD  Valentino Hue Magrinat, M.D.  Miguel Aschoff, M.D.

## 2011-10-21 ENCOUNTER — Other Ambulatory Visit: Payer: Self-pay | Admitting: Oncology

## 2011-10-21 ENCOUNTER — Ambulatory Visit (HOSPITAL_BASED_OUTPATIENT_CLINIC_OR_DEPARTMENT_OTHER): Payer: PRIVATE HEALTH INSURANCE | Admitting: Oncology

## 2011-10-21 ENCOUNTER — Other Ambulatory Visit (HOSPITAL_BASED_OUTPATIENT_CLINIC_OR_DEPARTMENT_OTHER): Payer: PRIVATE HEALTH INSURANCE | Admitting: Lab

## 2011-10-21 VITALS — BP 119/71 | HR 61 | Temp 98.1°F | Ht 63.5 in | Wt 124.2 lb

## 2011-10-21 DIAGNOSIS — C50919 Malignant neoplasm of unspecified site of unspecified female breast: Secondary | ICD-10-CM

## 2011-10-21 DIAGNOSIS — Z170 Estrogen receptor positive status [ER+]: Secondary | ICD-10-CM

## 2011-10-21 DIAGNOSIS — C50519 Malignant neoplasm of lower-outer quadrant of unspecified female breast: Secondary | ICD-10-CM

## 2011-10-21 LAB — COMPREHENSIVE METABOLIC PANEL
ALT: 18 U/L (ref 0–35)
AST: 25 U/L (ref 0–37)
Albumin: 3.8 g/dL (ref 3.5–5.2)
CO2: 30 mEq/L (ref 19–32)
Calcium: 9.4 mg/dL (ref 8.4–10.5)
Chloride: 99 mEq/L (ref 96–112)
Creatinine, Ser: 0.73 mg/dL (ref 0.50–1.10)
Potassium: 3.7 mEq/L (ref 3.5–5.3)
Sodium: 138 mEq/L (ref 135–145)
Total Protein: 7 g/dL (ref 6.0–8.3)

## 2011-10-21 LAB — CBC WITH DIFFERENTIAL/PLATELET
BASO%: 0.3 % (ref 0.0–2.0)
EOS%: 0.1 % (ref 0.0–7.0)
HCT: 36.3 % (ref 34.8–46.6)
MCH: 32.7 pg (ref 25.1–34.0)
MCHC: 34.3 g/dL (ref 31.5–36.0)
MONO#: 0.7 10*3/uL (ref 0.1–0.9)
RBC: 3.81 10*6/uL (ref 3.70–5.45)
RDW: 12.6 % (ref 11.2–14.5)
WBC: 5.6 10*3/uL (ref 3.9–10.3)
lymph#: 1.6 10*3/uL (ref 0.9–3.3)

## 2011-10-21 NOTE — Progress Notes (Signed)
ID: Vicki [name]   Interval History:  Vicki [name] returns for followup of her breast cancer the interval history is unremarkable. She continues to work for [employer]. She recently became a grandmother for the second time overall things are working well for her and her family without unusual events except for the fact that after a viral illness recently she developed palpitations and had to be hospitalized and was found to be in atrial fibrillation. Apparently she converted spontaneously. This has not recurred.   ROS:  A detailed review of systems was otherwise unremarkable she has very mild hot flashes. She benefits from gabapentin at bedtime and I was glad to refill that medication for her today otherwise a detailed review of systems was noncontributory  Medications: I have reviewed the patient's current medications. She is not on any anti-estrogens at present.   Current Outpatient Prescriptions  Medication Sig Dispense Refill  . simvastatin (ZOCOR) 20 MG tablet TAKE 1 TABLET BY MOUTH ONCE A DAY AT BEDTIME  90 tablet  2     Objective: Vital signs in last 24 hours: BP 119/71  Pulse 61  Temp 98.1 F (36.7 Vicki [name])  Ht 5' 3.5" (1.613 m)  Wt 124 lb 3.2 oz (56.337 kg)  BMI 21.66 kg/m2   Physical Exam:    Sclerae unicteric  Oropharynx clear  No peripheral adenopathy  Lungs clear -- no rales or rhonchi  Heart regular rate and rhythm  Abdomen benign  MSK no focal spinal tenderness, no peripheral edema  Neuro nonfocal  Breast exam: Right breast no suspicious findings, left breast status post lumpectomy no evidence of local recurrence  Lab Results:   CMP  Lab Results  Component Value Date   GLUCOSE 89 01/29/2011   CHOL 174 01/29/2011   TRIG 66.0 01/29/2011   HDL 72.50 01/29/2011   LDLCALC 88 01/29/2011   ALT 18 01/29/2011   AST 22 01/29/2011   NA 144 01/29/2011   K 5.0 01/29/2011   CL 106 01/29/2011   CREATININE 0.7 01/29/2011   BUN 15 01/29/2011   CO2 32 01/29/2011   TSH 1.86  01/29/2011     Lab Results  Component Value Date   WBC 5.6 10/21/2011   HGB 12.4 10/21/2011   HCT 36.3 10/21/2011   MCV 95.2 10/21/2011   PLT 240 10/21/2011        Studies/Results: She had mammography at [facility] yesterday; preliminary results are normal study  Assessment:  65 year old [place] woman status post left lumpectomy and sentinel lymph node sampling October of 2008 for a T1-1 N0 (stage I) grade 1 invasive ductal carcinoma, estrogen and progesterone receptor positive, HER-2 negative, with an MIB-1-1 of 12%, status post radiation completed January of 2009, briefly on tamoxifen, discontinued after 2 months because of side effects.   Plan:  There is no evidence of disease recurrence and indeed she has an excellent prognosis. She is going to see me again next October and that will be our last visit. I refilled her gabapentin today to take 300 mg at bedtime. She knows to call for any problems that may develop before the next visit.  Vicki [name] Vicki [name] 10/21/2011

## 2012-01-03 DIAGNOSIS — Z0000 Encounter for general adult medical examination without abnormal findings: Secondary | ICD-10-CM | POA: Insufficient documentation

## 2012-01-11 ENCOUNTER — Telehealth: Payer: Self-pay | Admitting: Pulmonary Disease

## 2012-01-11 MED ORDER — TRAMADOL HCL 50 MG PO TABS: 50.0000 mg | ORAL_TABLET | Freq: Four times a day (QID) | ORAL | Status: DC | PRN

## 2012-01-11 MED ORDER — HYDROCOD POLST-CHLORPHEN POLST 10-8 MG/5ML PO LQCR: ORAL | Status: DC

## 2012-01-11 NOTE — Telephone Encounter (Signed)
I spoke with pt and is aware of SN recs. She voiced her understanding and was fine with these medications being called in. Pt states she had a reaction to codeine several years ago but did not remember what type. Pt states she would like these called in for her and she will see how she does. If she begins to have a reaction she will call us back.

## 2012-01-11 NOTE — Telephone Encounter (Signed)
Per SN---ok for tussionex  #4oz  1 tsp every 12 hours prn cough with 5 refills, tramadol 50mg   #50  1 po qid prn pain , take with tylenol.  thanks

## 2012-01-11 NOTE — Telephone Encounter (Signed)
I spoke with pt and she c/o dry cough but occasional gets clear phlem up in the mornings, pain in her right side and is very painful, nausea. X 4 weeks.- denies any fever, chills, sweat, vomiting. She has been using Biofreeze, heating pad, and using ice packs last night, alternating b/w aleve and advil for pain. Pt is requesting an apt today but nothing available. Please advise Dr. Kriste Basque, thanks  Allergies  Allergen Reactions  . Codeine     REACTION: unknown

## 2012-01-12 ENCOUNTER — Telehealth: Payer: Self-pay | Admitting: Pulmonary Disease

## 2012-01-12 NOTE — Telephone Encounter (Signed)
Pt returned Mindy's call.  Holly D Pryor ° °

## 2012-01-12 NOTE — Telephone Encounter (Signed)
Patient has been taking the Tramadol and Tussinex together at bedtime and will now use Tramadol during the day and Tussines at bedtime to see if this helps. She will call back for OV if her sxs do not improve. Pt verbalized understanding.

## 2012-01-12 NOTE — Telephone Encounter (Signed)
Per SN, if pt is taking the Tramadol and Tussinex together she should start taking the tramadol during the day and Tussinex at night. She can also try cutting the dose on Tussinex if this is what she thinks is causing the dizziness. No further recs per SN. If she continues to have problems she will need to stop the Tussinex and come in for OV.  Most prescription cough syrups contain codeine or some derivative.  LMOMTCB x 1.

## 2012-01-12 NOTE — Telephone Encounter (Signed)
I spoke with pt and she states when she got up at 4 this morning and was feeling very faint. She states she went back to bed and woke up and feels fine now. Pt states she thinks this may be due to the tussionex and the tramadol given to her yesterday. Denies any rash, SOB, swelling. Pt states she is going on vacation in a couple of weeks and wants to feel well before she leaves. Pt is requesting further recs. Please advise Dr. Kriste Basque, thanks  Allergies  Allergen Reactions  . Codeine     REACTION: unknown

## 2012-02-11 ENCOUNTER — Encounter: Payer: Self-pay | Admitting: Pulmonary Disease

## 2012-02-11 ENCOUNTER — Ambulatory Visit (INDEPENDENT_AMBULATORY_CARE_PROVIDER_SITE_OTHER): Payer: PRIVATE HEALTH INSURANCE | Admitting: Pulmonary Disease

## 2012-02-11 ENCOUNTER — Ambulatory Visit (INDEPENDENT_AMBULATORY_CARE_PROVIDER_SITE_OTHER)
Admission: RE | Admit: 2012-02-11 | Discharge: 2012-02-11 | Disposition: A | Discharge status: Home / Self Care | Payer: PRIVATE HEALTH INSURANCE | Source: Ambulatory Visit | Attending: Pulmonary Disease | Admitting: Pulmonary Disease

## 2012-02-11 ENCOUNTER — Other Ambulatory Visit (INDEPENDENT_AMBULATORY_CARE_PROVIDER_SITE_OTHER): Payer: PRIVATE HEALTH INSURANCE

## 2012-02-11 DIAGNOSIS — K219 Gastro-esophageal reflux disease without esophagitis: Secondary | ICD-10-CM

## 2012-02-11 DIAGNOSIS — F411 Generalized anxiety disorder: Secondary | ICD-10-CM

## 2012-02-11 DIAGNOSIS — I4891 Unspecified atrial fibrillation: Secondary | ICD-10-CM

## 2012-02-11 DIAGNOSIS — C50919 Malignant neoplasm of unspecified site of unspecified female breast: Secondary | ICD-10-CM

## 2012-02-11 DIAGNOSIS — M1990 Unspecified osteoarthritis, unspecified site: Secondary | ICD-10-CM

## 2012-02-11 DIAGNOSIS — M899 Disorder of bone, unspecified: Secondary | ICD-10-CM

## 2012-02-11 DIAGNOSIS — M949 Disorder of cartilage, unspecified: Secondary | ICD-10-CM

## 2012-02-11 DIAGNOSIS — E785 Hyperlipidemia, unspecified: Secondary | ICD-10-CM

## 2012-02-11 DIAGNOSIS — I359 Nonrheumatic aortic valve disorder, unspecified: Secondary | ICD-10-CM

## 2012-02-11 DIAGNOSIS — K5730 Diverticulosis of large intestine without perforation or abscess without bleeding: Secondary | ICD-10-CM

## 2012-02-11 LAB — CBC WITH DIFFERENTIAL/PLATELET
Eosinophils Relative: 0.1 % (ref 0.0–5.0)
Lymphocytes Relative: 29.2 % (ref 12.0–46.0)
MCV: 96.7 fl (ref 78.0–100.0)
Monocytes Absolute: 0.7 10*3/uL (ref 0.1–1.0)
Monocytes Relative: 11.9 % (ref 3.0–12.0)
Neutrophils Relative %: 58.5 % (ref 43.0–77.0)
Platelets: 251 10*3/uL (ref 150.0–400.0)
WBC: 6 10*3/uL (ref 4.5–10.5)

## 2012-02-11 LAB — BASIC METABOLIC PANEL
BUN: 12 mg/dL (ref 6–23)
CO2: 31 mEq/L (ref 19–32)
Chloride: 104 mEq/L (ref 96–112)
Glucose, Bld: 101 mg/dL — ABNORMAL HIGH (ref 70–99)
Potassium: 5.4 mEq/L — ABNORMAL HIGH (ref 3.5–5.1)

## 2012-02-11 LAB — HEPATIC FUNCTION PANEL
ALT: 20 U/L (ref 0–35)
AST: 24 U/L (ref 0–37)
Albumin: 4.2 g/dL (ref 3.5–5.2)
Total Protein: 7.1 g/dL (ref 6.0–8.3)

## 2012-02-11 LAB — LIPID PANEL
Cholesterol: 175 mg/dL (ref 0–200)
Triglycerides: 59 mg/dL (ref 0.0–149.0)

## 2012-02-11 LAB — TSH: TSH: 1.3 u[IU]/mL (ref 0.35–5.50)

## 2012-02-11 MED ORDER — METOPROLOL TARTRATE 25 MG PO TABS: ORAL_TABLET | ORAL | Status: DC

## 2012-02-11 MED ORDER — SIMVASTATIN 20 MG PO TABS: 20.0000 mg | ORAL_TABLET | Freq: Every day | ORAL | Status: DC

## 2012-02-11 NOTE — Patient Instructions (Signed)
Today we updated your med list in our EPIC system...    Continue your current medications the same...  Today we did your follow up CXR & fasting blood work...    Please call the PHONE TREE in a few days for your results...    Dial [id number] & when prompted enter your patient number followed by the # symbol...    Your patient number is:  [id number]#  We discussed adjustments in your Metoprolol & Gabapentin...  Call for any questions...  Let's continue our yearly f/u visits, but call sooner if needed for problems.Marland KitchenMarland Kitchen
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
## 2012-02-12 LAB — VITAMIN D 25 HYDROXY (VIT D DEFICIENCY, FRACTURES): Vit D, 25-Hydroxy: 57 ng/mL (ref 30–89)

## 2012-02-16 ENCOUNTER — Ambulatory Visit (INDEPENDENT_AMBULATORY_CARE_PROVIDER_SITE_OTHER)
Admission: RE | Admit: 2012-02-16 | Discharge: 2012-02-16 | Disposition: A | Discharge status: Home / Self Care | Payer: PRIVATE HEALTH INSURANCE | Source: Ambulatory Visit

## 2012-02-16 DIAGNOSIS — M949 Disorder of cartilage, unspecified: Secondary | ICD-10-CM

## 2012-02-16 DIAGNOSIS — M899 Disorder of bone, unspecified: Secondary | ICD-10-CM

## 2012-02-16 DIAGNOSIS — M1990 Unspecified osteoarthritis, unspecified site: Secondary | ICD-10-CM

## 2012-02-22 ENCOUNTER — Encounter: Payer: Self-pay | Admitting: Pulmonary Disease

## 2012-02-23 ENCOUNTER — Telehealth: Payer: Self-pay | Admitting: Pulmonary Disease

## 2012-02-23 MED ORDER — METOPROLOL TARTRATE 25 MG PO TABS: ORAL_TABLET | ORAL | Status: DC

## 2012-02-23 MED ORDER — SIMVASTATIN 20 MG PO TABS: 20.0000 mg | ORAL_TABLET | Freq: Every day | ORAL | Status: DC

## 2012-02-23 NOTE — Telephone Encounter (Signed)
I spoke with pt and advised her i have the rx's to the drug source She voiced her understanding and had no questions

## 2012-04-14 ENCOUNTER — Other Ambulatory Visit: Payer: Self-pay | Admitting: Obstetrics and Gynecology

## 2012-09-12 ENCOUNTER — Telehealth: Payer: Self-pay | Admitting: Oncology

## 2012-09-12 ENCOUNTER — Other Ambulatory Visit: Payer: Self-pay | Admitting: *Deleted

## 2012-09-12 NOTE — Telephone Encounter (Signed)
lmonvm adviisng the pt of her nov appts

## 2012-09-12 NOTE — Progress Notes (Signed)
Message received from pt inquiring about yearly appt not scheduled " I thought Dr Vicki [name] said at my last visit he would see me 1 more time but when I spoke with scheduling they don't have an appointment for me ".  This RN reviewed last dictation and noted MD stating follow up in 1 yr for probable last visit in October 2013.  onc tx sent to scheduling for appointment for follow up.  Message left on pt's identified VM.

## 2012-09-18 ENCOUNTER — Ambulatory Visit: Payer: PRIVATE HEALTH INSURANCE | Admitting: Oncology

## 2012-10-10 ENCOUNTER — Other Ambulatory Visit: Payer: Self-pay | Admitting: Oncology

## 2012-10-12 ENCOUNTER — Other Ambulatory Visit: Payer: PRIVATE HEALTH INSURANCE | Admitting: Lab

## 2012-10-12 ENCOUNTER — Other Ambulatory Visit: Payer: Self-pay | Admitting: *Deleted

## 2012-10-19 ENCOUNTER — Ambulatory Visit: Payer: PRIVATE HEALTH INSURANCE | Admitting: Oncology

## 2012-11-09 ENCOUNTER — Other Ambulatory Visit (HOSPITAL_BASED_OUTPATIENT_CLINIC_OR_DEPARTMENT_OTHER): Payer: PRIVATE HEALTH INSURANCE | Admitting: Lab

## 2012-11-09 DIAGNOSIS — C50519 Malignant neoplasm of lower-outer quadrant of unspecified female breast: Secondary | ICD-10-CM

## 2012-11-09 DIAGNOSIS — C50919 Malignant neoplasm of unspecified site of unspecified female breast: Secondary | ICD-10-CM

## 2012-11-09 LAB — CBC WITH DIFFERENTIAL/PLATELET
Basophils Absolute: 0 10*3/uL (ref 0.0–0.1)
EOS%: 0.1 % (ref 0.0–7.0)
HCT: 35.8 % (ref 34.8–46.6)
HGB: 12.4 g/dL (ref 11.6–15.9)
MCH: 33 pg (ref 25.1–34.0)
MCV: 95.2 fL (ref 79.5–101.0)
NEUT%: 61 % (ref 38.4–76.8)
Platelets: 234 10*3/uL (ref 145–400)
lymph#: 1.7 10*3/uL (ref 0.9–3.3)

## 2012-11-09 LAB — COMPREHENSIVE METABOLIC PANEL (CC13)
AST: 22 U/L (ref 5–34)
BUN: 12 mg/dL (ref 7.0–26.0)
Calcium: 9.5 mg/dL (ref 8.4–10.4)
Chloride: 103 mEq/L (ref 98–107)
Creatinine: 0.7 mg/dL (ref 0.6–1.1)

## 2012-11-16 ENCOUNTER — Ambulatory Visit (HOSPITAL_BASED_OUTPATIENT_CLINIC_OR_DEPARTMENT_OTHER): Payer: PRIVATE HEALTH INSURANCE | Admitting: Oncology

## 2012-11-16 VITALS — BP 120/75 | HR 66 | Temp 97.6°F | Resp 20 | Ht 63.0 in | Wt 127.6 lb

## 2012-11-16 DIAGNOSIS — C50919 Malignant neoplasm of unspecified site of unspecified female breast: Secondary | ICD-10-CM

## 2012-11-16 DIAGNOSIS — Z853 Personal history of malignant neoplasm of breast: Secondary | ICD-10-CM

## 2012-11-16 DIAGNOSIS — M949 Disorder of cartilage, unspecified: Secondary | ICD-10-CM

## 2012-11-16 NOTE — Progress Notes (Signed)
ID: Vicki [name]   DOB: [birth year]  MR#: [id number]  CSN#:[id number]  PCP: Vicki Mcalpine, MD GYN:  SU:  OTHER MD:   HISTORY OF PRESENT ILLNESS: Her tumor was diagnosed by screening mammography obtained August 16, 2007.  She had additional images obtained at [facility], September 17 and this showed a 4 mm spiculated nodule in the left breast lower outer quadrant.  Biopsy was performed at the breast center who also repeated a unilateral left diagnostic mammogram showing a 6 cm spiculated mass on their machine.  The biopsy obtained September 30 showed [phone] and [phone]) an invasive ductal carcinoma, which was 93% ER positive 78%, PR positive, 12% MIB-1 and 1+ (negative) on the HercepTest.  Breast MRI was obtained at [facility] imaging on October 8.  This showed nothing on the right, on the left there was a 7 mm irregular enhancing mass.  There was nothing suggestive of lymphadenopathy.  Accordingly, Dr. Meryl [name] proceeded to left lumpectomy and sentinel lymph node sampling September 26, 2007.  The final pathology ([id number] from [facility]) showed a 5 mm invasive ductal carcinoma, grade 1, with excellent margins, no evidence of lymphovascular invasion, and 0 of 3 sentinel lymph nodes involved. Her subsequent history is as detailed below.  INTERVAL HISTORY: Vicki [name] returns today for followup of her breast cancer. Today's "graduation day". She is very excited about this.  REVIEW OF SYSTEMS: She is doing "wonderful", except for the fact that she fell while taking care of her grandchildren and fractured her left arm. That is now doing a lot better. It has kept her from going to [employer] where she usually exercises. She still having hot flashes, which are no better or worse than before. A detailed review of systems today was otherwise entirely negative.  PAST MEDICAL HISTORY: Past Medical History  Diagnosis Date  . Allergic rhinitis   . Aortic insufficiency    . Paroxysmal atrial fibrillation   . Hyperlipidemia   . GERD (gastroesophageal reflux disease)   . Diverticulosis of colon   . Hx of colonic polyps   . Adenocarcinoma, breast   . DJD (degenerative joint disease)   . Osteopenia   . Anxiety   . Contact dermatitis   Is significant for hypercholesterolemia.  There is also mild GERD.  There is some osteoarthritis and there is osteopenia diagnosed by bone density at [facility] more than five years ago.  The patient is status post hysterectomy with unilateral salpingo-oophorectomy in 1984.  PAST SURGICAL HISTORY: Past Surgical History  Procedure Date  . Abdominal hysterectomy 1984    1 ovary  . Left breast lumpectomy 08/2007    node bx    FAMILY HISTORY No family history on file. The patient's father died from myocardial infarction at the age of 66.  The patient's mother died at the age of 66 with what sounds like ITP.  She had multiple other medical conditions however.  The patient has one brother and one sister in good health.  A second sister died from a T-cell non-Hodgkin's lymphoma at age 64.  There is no history of breast or ovarian cancer in the family.  GYNECOLOGIC HISTORY: The patient is Gx, P1, first pregnancy age 20.  She did not nurse, of course she has not had periods since her hysterectomy in 1987.  She took hormones for well over 10 years, quitting September 2008.  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  Her husband of 19 years, Vicki [name], used to work for [employer] as well, but currently is retired and does a little work for [place] airport "just to keep busy".  Vicki [name]'s son is Vicki [name].  He is 40, lives in [place] and is the co-owner of a heating and air-conditioning business.  He has an 33-month-old daughter.  Vicki [name] has two children from a prior marriage, Vicki [name] 71 in [place]  who owns his own rig and Vicki [name], 37, who lives in [place].  He has a total of four grandchildren.  The patient attends the [employer] [address] in [place].   ADVANCED DIRECTIVES: Not in place  HEALTH MAINTENANCE: History  Substance Use Topics  . Smoking status: Former Smoker    Types: Cigarettes    Quit date: 12/06/1976  . Smokeless tobacco: Not on file  . Alcohol Use: No     Colonoscopy:  PAP:  Bone density:  Lipid panel:  Allergies  Allergen Reactions  . Codeine     REACTION: unknown    Current Outpatient Prescriptions  Medication Sig Dispense Refill  . aspirin 81 MG tablet Take 81 mg by mouth daily.      . fish oil-omega-3 fatty acids 1000 MG capsule Take 2 g by mouth daily.      Marland Kitchen gabapentin (NEURONTIN) 300 MG capsule TAKE 1 CAPSULE BY MOUTH DAILY AT BEDTIME  90 capsule  2  . metoprolol tartrate (LOPRESSOR) 25 MG tablet Take 1/2 tablet by mouth two times daily  90 tablet  3  . Multiple Vitamins-Minerals (CENTRUM SILVER PO) Take 1 tablet by mouth daily.      Marland Kitchen omeprazole (PRILOSEC) 20 MG capsule Take 20 mg by mouth daily.      . simvastatin (ZOCOR) 20 MG tablet Take 1 tablet (20 mg total) by mouth at bedtime.  90 tablet  3    OBJECTIVE: Middle-aged white woman who appears well Filed Vitals:   11/16/12 1456  BP: 120/75  Pulse: 66  Temp: 97.6 F (36.4 C)  Resp: 20     Body mass index is 22.60 kg/(m^2).    ECOG FS: 0  Sclerae unicteric Oropharynx clear No cervical or supraclavicular adenopathy Lungs no rales or rhonchi Heart regular rate and rhythm Abd benign MSK no focal spinal tenderness, no peripheral edema Neuro: nonfocal Breasts: The right breast is unremarkable. The left breast is status post lumpectomy. There is no evidence of local recurrence. The left axilla is benign.   LAB RESULTS: Lab Results  Component Value Date   WBC 6.3 11/09/2012   NEUTROABS 3.9 11/09/2012   HGB 12.4 11/09/2012   HCT 35.8 11/09/2012   MCV 95.2 11/09/2012   PLT 234 11/09/2012       Chemistry      Component Value Date/Time   NA 139 11/09/2012 1218   NA 141 02/11/2012 1016   K 4.1 11/09/2012 1218   K 5.4* 02/11/2012 1016   CL 103 11/09/2012 1218   CL 104 02/11/2012 1016   CO2 30* 11/09/2012 1218   CO2 31 02/11/2012 1016   BUN 12.0 11/09/2012 1218   BUN 12 02/11/2012 1016   CREATININE 0.7 11/09/2012 1218   CREATININE 0.7 02/11/2012 1016      Component Value Date/Time   CALCIUM 9.5 11/09/2012 1218   CALCIUM 9.9 02/11/2012 1016   ALKPHOS 83 11/09/2012 1218   ALKPHOS 67 02/11/2012 1016   AST 22 11/09/2012 1218   AST 24  02/11/2012 1016   ALT 19 11/09/2012 1218   ALT 20 02/11/2012 1016   BILITOT 0.36 11/09/2012 1218   BILITOT 0.3 02/11/2012 1016       Lab Results  Component Value Date   LABCA2 24 05/07/2009    No components found with this basename: [id number]    No results found for this basename: INR:1;PROTIME:1 in the last 168 hours  Urinalysis    Component Value Date/Time   COLORURINE YELLOW 01/29/2011 1147   APPEARANCEUR CLEAR 01/29/2011 1147   LABSPEC 1.010 01/29/2011 1147   PHURINE 7.5 01/29/2011 1147   HGBUR NEGATIVE 01/29/2011 1147   BILIRUBINUR NEGATIVE 01/29/2011 1147   KETONESUR NEGATIVE 01/29/2011 1147   UROBILINOGEN 0.2 01/29/2011 1147   NITRITE NEGATIVE 01/29/2011 1147   LEUKOCYTESUR NEGATIVE 01/29/2011 1147    STUDIES: Mammogram at [facility] 10/31/2012 showed stable architectural distortion at the lumpectomy site on the left, with a few new but probably benign dystrophic microcalcifications within the lumpectomy scar. A six-month followup was suggested.  ASSESSMENT: 66 y.o.Level [facility] woman status post left lumpectomy and sentinel lymph node sampling October of 2008 for a T1a N0 (stage I) grade 1 invasive ductal carcinoma, estrogen and progesterone receptor positive, HER-2 negative, with an MIB-1 of 12%, status post radiation completed January of 2009, briefly on tamoxifen, discontinued after 2 months because of side effects   PLAN: She is now a little  over 5 years out from her definitive surgery and I am comfortable releasing her to her primary care physician. She does have osteopenia, nearly to osteoporosis, and we did talk about bisphosphonates, which she is very much against because of problems with her jaw (she has multiple implants). She might consider Forteo, but perhaps she could consider raloxifene, which would have the added benefit of repeat due seeing her risk of developing a new breast cancer in the future. She can discuss all this with Dr. Dickey Gave at their next visit. We are making no further visits for her here, though she knows that we'll be glad to see her at any point in the future if the need arises.   Josette Shimabukuro C    11/16/2012

## 2012-11-17 ENCOUNTER — Telehealth: Payer: Self-pay | Admitting: Oncology

## 2012-11-17 NOTE — Telephone Encounter (Signed)
Fax letter to Dr. Alroy Dust from Dr. Darnelle Catalan

## 2013-01-22 ENCOUNTER — Ambulatory Visit (INDEPENDENT_AMBULATORY_CARE_PROVIDER_SITE_OTHER): Payer: PRIVATE HEALTH INSURANCE | Admitting: Pulmonary Disease

## 2013-01-22 ENCOUNTER — Other Ambulatory Visit: Payer: Self-pay | Admitting: Pulmonary Disease

## 2013-01-22 ENCOUNTER — Encounter: Payer: Self-pay | Admitting: Pulmonary Disease

## 2013-01-22 VITALS — BP 142/60 | HR 55 | Temp 97.9°F | Ht 63.0 in | Wt 129.2 lb

## 2013-01-22 DIAGNOSIS — E785 Hyperlipidemia, unspecified: Secondary | ICD-10-CM

## 2013-01-22 DIAGNOSIS — Z86010 Personal history of colonic polyps: Secondary | ICD-10-CM

## 2013-01-22 DIAGNOSIS — K5730 Diverticulosis of large intestine without perforation or abscess without bleeding: Secondary | ICD-10-CM

## 2013-01-22 DIAGNOSIS — B029 Zoster without complications: Secondary | ICD-10-CM | POA: Insufficient documentation

## 2013-01-22 DIAGNOSIS — M1990 Unspecified osteoarthritis, unspecified site: Secondary | ICD-10-CM

## 2013-01-22 DIAGNOSIS — M899 Disorder of bone, unspecified: Secondary | ICD-10-CM

## 2013-01-22 DIAGNOSIS — K219 Gastro-esophageal reflux disease without esophagitis: Secondary | ICD-10-CM

## 2013-01-22 DIAGNOSIS — M949 Disorder of cartilage, unspecified: Secondary | ICD-10-CM

## 2013-01-22 DIAGNOSIS — I359 Nonrheumatic aortic valve disorder, unspecified: Secondary | ICD-10-CM

## 2013-01-22 DIAGNOSIS — C50919 Malignant neoplasm of unspecified site of unspecified female breast: Secondary | ICD-10-CM

## 2013-01-22 DIAGNOSIS — I4891 Unspecified atrial fibrillation: Secondary | ICD-10-CM

## 2013-01-22 DIAGNOSIS — F411 Generalized anxiety disorder: Secondary | ICD-10-CM

## 2013-01-22 MED ORDER — METHYLPREDNISOLONE ACETATE 80 MG/ML IJ SUSP
80.0000 mg | Freq: Once | INTRAMUSCULAR | Status: AC
  Administered 2013-01-22: 80 mg via INTRAMUSCULAR

## 2013-01-22 MED ORDER — FAMCICLOVIR 500 MG PO TABS: 500.0000 mg | ORAL_TABLET | Freq: Three times a day (TID) | ORAL | Status: DC

## 2013-01-22 MED ORDER — METHYLPREDNISOLONE 4 MG PO KIT: PACK | ORAL | Status: DC

## 2013-01-22 MED ORDER — METOPROLOL TARTRATE 25 MG PO TABS: ORAL_TABLET | ORAL | Status: DC

## 2013-01-22 MED ORDER — HYDROCODONE-ACETAMINOPHEN 5-325 MG PO TABS: ORAL_TABLET | ORAL | Status: DC

## 2013-01-22 NOTE — Patient Instructions (Addendum)
Today we updated your med list in our EPIC system...    Continue your current medications the same...  For the Shingles eruption:    Keep the area clean w/ some mild soapy water...    Take the [facility] 500mg  three times daily for 7d...    We gave you a Depo shot & Medrol dosepak (start in AM & follow directions on the pack)    Finally- we wrote for VICODIN- 1/2 to 1 tab every 4H as needed for pain...  Please return to our lab at your convenience for the FASTING blood work...    We will contact you w/ the results when avail...  Call for any questions.Marland KitchenMarland Kitchen
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
## 2013-02-14 ENCOUNTER — Other Ambulatory Visit (INDEPENDENT_AMBULATORY_CARE_PROVIDER_SITE_OTHER): Payer: PRIVATE HEALTH INSURANCE

## 2013-02-14 ENCOUNTER — Ambulatory Visit: Payer: PRIVATE HEALTH INSURANCE | Admitting: Pulmonary Disease

## 2013-02-14 DIAGNOSIS — E785 Hyperlipidemia, unspecified: Secondary | ICD-10-CM

## 2013-02-14 DIAGNOSIS — F411 Generalized anxiety disorder: Secondary | ICD-10-CM

## 2013-02-14 DIAGNOSIS — M949 Disorder of cartilage, unspecified: Secondary | ICD-10-CM

## 2013-02-14 LAB — VITAMIN D 25 HYDROXY (VIT D DEFICIENCY, FRACTURES): Vit D, 25-Hydroxy: 57 ng/mL (ref 30–89)

## 2013-02-14 LAB — TSH: TSH: 1.39 u[IU]/mL (ref 0.35–5.50)

## 2013-02-15 ENCOUNTER — Other Ambulatory Visit: Payer: Self-pay | Admitting: Pulmonary Disease

## 2013-03-12 ENCOUNTER — Telehealth: Payer: Self-pay | Admitting: Pulmonary Disease

## 2013-03-12 NOTE — Telephone Encounter (Signed)
I spoke with pt about lab results as she does not remember speaking with leigh regarding these. She voiced her understanding and needed nothing further

## 2013-03-26 ENCOUNTER — Other Ambulatory Visit: Payer: Self-pay | Admitting: *Deleted

## 2013-03-26 DIAGNOSIS — C50919 Malignant neoplasm of unspecified site of unspecified female breast: Secondary | ICD-10-CM

## 2013-07-23 ENCOUNTER — Telehealth: Payer: Self-pay | Admitting: Pulmonary Disease

## 2013-07-23 ENCOUNTER — Other Ambulatory Visit: Payer: Self-pay | Admitting: Physician Assistant

## 2013-07-23 ENCOUNTER — Other Ambulatory Visit: Payer: Self-pay | Admitting: Oncology

## 2013-07-23 MED ORDER — GABAPENTIN 300 MG PO CAPS: 300.0000 mg | ORAL_CAPSULE | Freq: Every day | ORAL | Status: DC

## 2013-07-23 NOTE — Telephone Encounter (Signed)
Has been released from follow up.  Rx should come from PCP, Dr. Kriste Basque

## 2013-07-23 NOTE — Telephone Encounter (Signed)
Rx has been sent in. 

## 2013-10-30 ENCOUNTER — Telehealth: Payer: Self-pay | Admitting: Pulmonary Disease

## 2013-10-30 DIAGNOSIS — C50919 Malignant neoplasm of unspecified site of unspecified female breast: Secondary | ICD-10-CM

## 2013-10-30 NOTE — Telephone Encounter (Signed)
I called and spoke with pt. She is scheduled for mammogram 11/13/13 at [facility]. Pt just needs an order sent to them. Please advise SN if okay to do so? thanks

## 2013-10-30 NOTE — Telephone Encounter (Signed)
Ok to order the mammogram at [facility] per pts request.  thanks

## 2013-10-30 NOTE — Telephone Encounter (Signed)
Order placed. Aisling Emigh, CMA  

## 2013-11-13 ENCOUNTER — Other Ambulatory Visit: Payer: Self-pay | Admitting: Pulmonary Disease

## 2014-01-22 ENCOUNTER — Ambulatory Visit: Payer: PRIVATE HEALTH INSURANCE | Admitting: Pulmonary Disease

## 2014-02-14 ENCOUNTER — Telehealth: Payer: Self-pay | Admitting: Pulmonary Disease

## 2014-02-14 NOTE — Telephone Encounter (Signed)
Called and spoke with pt and she is aware that she will need to be set up with new primary care.  Pt was given the number to [facility] and she will call and set up with them

## 2014-02-19 ENCOUNTER — Other Ambulatory Visit: Payer: Self-pay | Admitting: Pulmonary Disease

## 2014-02-28 DIAGNOSIS — J069 Acute upper respiratory infection, unspecified: Secondary | ICD-10-CM | POA: Insufficient documentation

## 2014-03-19 ENCOUNTER — Ambulatory Visit (INDEPENDENT_AMBULATORY_CARE_PROVIDER_SITE_OTHER): Payer: PRIVATE HEALTH INSURANCE | Admitting: Internal Medicine

## 2014-03-19 ENCOUNTER — Other Ambulatory Visit (INDEPENDENT_AMBULATORY_CARE_PROVIDER_SITE_OTHER): Payer: PRIVATE HEALTH INSURANCE

## 2014-03-19 ENCOUNTER — Encounter: Payer: Self-pay | Admitting: Internal Medicine

## 2014-03-19 VITALS — BP 158/84 | HR 80 | Temp 98.1°F | Resp 16 | Ht 63.0 in | Wt 125.0 lb

## 2014-03-19 DIAGNOSIS — Z0000 Encounter for general adult medical examination without abnormal findings: Secondary | ICD-10-CM

## 2014-03-19 DIAGNOSIS — IMO0001 Reserved for inherently not codable concepts without codable children: Secondary | ICD-10-CM

## 2014-03-19 DIAGNOSIS — R05 Cough: Secondary | ICD-10-CM

## 2014-03-19 DIAGNOSIS — R209 Unspecified disturbances of skin sensation: Secondary | ICD-10-CM

## 2014-03-19 DIAGNOSIS — R202 Paresthesia of skin: Secondary | ICD-10-CM

## 2014-03-19 DIAGNOSIS — J309 Allergic rhinitis, unspecified: Secondary | ICD-10-CM

## 2014-03-19 DIAGNOSIS — R030 Elevated blood-pressure reading, without diagnosis of hypertension: Secondary | ICD-10-CM

## 2014-03-19 DIAGNOSIS — E785 Hyperlipidemia, unspecified: Secondary | ICD-10-CM

## 2014-03-19 DIAGNOSIS — Z23 Encounter for immunization: Secondary | ICD-10-CM

## 2014-03-19 DIAGNOSIS — R059 Cough, unspecified: Secondary | ICD-10-CM

## 2014-03-19 LAB — URINALYSIS, ROUTINE W REFLEX MICROSCOPIC
Bilirubin Urine: NEGATIVE
Hgb urine dipstick: NEGATIVE
KETONES UR: NEGATIVE
Nitrite: NEGATIVE
PH: 7.5 (ref 5.0–8.0)
RBC / HPF: NONE SEEN (ref 0–?)
Specific Gravity, Urine: 1.015 (ref 1.000–1.030)
Total Protein, Urine: NEGATIVE
URINE GLUCOSE: NEGATIVE
UROBILINOGEN UA: 0.2 (ref 0.0–1.0)

## 2014-03-19 LAB — BASIC METABOLIC PANEL
BUN: 14 mg/dL (ref 6–23)
CHLORIDE: 104 meq/L (ref 96–112)
CO2: 30 mEq/L (ref 19–32)
CREATININE: 0.6 mg/dL (ref 0.4–1.2)
Calcium: 9.7 mg/dL (ref 8.4–10.5)
GFR: 98.22 mL/min (ref >60.00)
Glucose, Bld: 102 mg/dL — ABNORMAL HIGH (ref 70–99)
POTASSIUM: 5.1 meq/L (ref 3.5–5.1)
Sodium: 142 mEq/L (ref 135–145)

## 2014-03-19 LAB — CBC WITH DIFFERENTIAL/PLATELET
Basophils Absolute: 0 10*3/uL (ref 0.0–0.1)
Basophils Relative: 0.3 % (ref 0.0–3.0)
EOS ABS: 0 10*3/uL (ref 0.0–0.7)
Eosinophils Relative: 0.1 % (ref 0.0–5.0)
HEMATOCRIT: 36.7 % (ref 36.0–46.0)
Hemoglobin: 12.4 g/dL (ref 12.0–15.0)
LYMPHS ABS: 1.7 10*3/uL (ref 0.7–4.0)
Lymphocytes Relative: 25.6 % (ref 12.0–46.0)
MCHC: 33.8 g/dL (ref 30.0–36.0)
MCV: 96.7 fl (ref 78.0–100.0)
Monocytes Absolute: 0.7 10*3/uL (ref 0.1–1.0)
Monocytes Relative: 9.9 % (ref 3.0–12.0)
NEUTROS PCT: 64.1 % (ref 43.0–77.0)
Neutro Abs: 4.2 10*3/uL (ref 1.4–7.7)
PLATELETS: 293 10*3/uL (ref 150.0–400.0)
RBC: 3.79 Mil/uL — ABNORMAL LOW (ref 3.87–5.11)
RDW: 12.9 % (ref 11.5–14.6)
WBC: 6.6 10*3/uL (ref 4.5–10.5)

## 2014-03-19 LAB — TSH: TSH: 2.16 u[IU]/mL (ref 0.35–5.50)

## 2014-03-19 LAB — LIPID PANEL
Cholesterol: 173 mg/dL (ref 0–200)
HDL: 69.5 mg/dL (ref >39.00)
LDL Cholesterol: 83 mg/dL (ref 0–99)
TRIGLYCERIDES: 101 mg/dL (ref 0.0–149.0)
Total CHOL/HDL Ratio: 2
VLDL: 20.2 mg/dL (ref 0.0–40.0)

## 2014-03-19 LAB — HEPATIC FUNCTION PANEL
ALBUMIN: 3.9 g/dL (ref 3.5–5.2)
ALT: 20 U/L (ref 0–35)
AST: 21 U/L (ref 0–37)
Alkaline Phosphatase: 65 U/L (ref 39–117)
BILIRUBIN DIRECT: 0.1 mg/dL (ref 0.0–0.3)
Total Bilirubin: 0.5 mg/dL (ref 0.3–1.2)
Total Protein: 7.2 g/dL (ref 6.0–8.3)

## 2014-03-19 LAB — VITAMIN B12: Vitamin B-12: 884 pg/mL (ref 211–911)

## 2014-03-19 MED ORDER — VITAMIN D 1000 UNITS PO TABS: 1000.0000 [IU] | ORAL_TABLET | Freq: Every day | ORAL | Status: AC

## 2014-03-19 NOTE — Patient Instructions (Signed)
Loratidine 1 a day Prilosec one a day in am x 2 weeks Normal BP<130/85 Low salt diet

## 2014-03-19 NOTE — Progress Notes (Signed)
Pre visit review using our clinic review tool, if applicable. No additional management support is needed unless otherwise documented below in the visit note. 

## 2014-03-19 NOTE — Assessment & Plan Note (Signed)
We discussed age appropriate health related issues, including available/recomended screening tests and vaccinations. We discussed a need for adhering to healthy diet and exercise. Labs/EKG were reviewed/ordered. All questions were answered.   

## 2014-03-19 NOTE — Assessment & Plan Note (Signed)
Check BP NAS diet

## 2014-03-19 NOTE — Progress Notes (Signed)
Subjective:    HPI  New pt - switching fro Dr Lenna Gilford  The patient is here for a wellness exam. The patient has been doing well overall without major physical or psychological issues going on lately. The patient needs to address  chronic palpitations that has been well controlled with medicines; to address chronic  hyperlipidemia controlled with medicines as well C/o cough  1 mo post URI, dry  BP Readings from Last 3 Encounters:  03/19/14 158/84  01/22/13 142/60  11/16/12 120/75   Wt Readings from Last 3 Encounters:  03/19/14 125 lb (56.7 kg)  01/22/13 129 lb 3.2 oz (58.605 kg)  11/16/12 127 lb 9.6 oz (57.879 kg)     Review of Systems  Constitutional: Negative for fever, chills, diaphoresis, activity change, appetite change, fatigue and unexpected weight change.  HENT: Negative for congestion, dental problem, ear pain, hearing loss, mouth sores, postnasal drip, sinus pressure, sneezing, sore throat and voice change.   Eyes: Negative for pain and visual disturbance.  Respiratory: Positive for cough. Negative for chest tightness, wheezing and stridor.   Cardiovascular: Negative for chest pain, palpitations and leg swelling.  Gastrointestinal: Negative for nausea, vomiting, abdominal pain, blood in stool, abdominal distention and rectal pain.  Genitourinary: Negative for dysuria, hematuria, decreased urine volume, vaginal bleeding, vaginal discharge, difficulty urinating, vaginal pain and menstrual problem.  Musculoskeletal: Negative for back pain, gait problem, joint swelling and neck pain.  Skin: Negative for color change, rash and wound.  Neurological: Negative for dizziness, tremors, syncope, speech difficulty and light-headedness.  Hematological: Negative for adenopathy.  Psychiatric/Behavioral: Negative for suicidal ideas, hallucinations, behavioral problems, confusion, sleep disturbance, dysphoric mood and decreased concentration. The patient is not hyperactive.          Objective:   Physical Exam  Constitutional: She appears well-developed. No distress.  HENT:  Head: Normocephalic.  Right Ear: External ear normal.  Left Ear: External ear normal.  Nose: Nose normal.  Mouth/Throat: Oropharynx is clear and moist.  Eyes: Conjunctivae are normal. Pupils are equal, round, and reactive to light. Right eye exhibits no discharge. Left eye exhibits no discharge.  Neck: Normal range of motion. Neck supple. No JVD present. No tracheal deviation present. No thyromegaly present.  Cardiovascular: Normal rate, regular rhythm and normal heart sounds.   Pulmonary/Chest: No stridor. No respiratory distress. She has no wheezes.  Abdominal: Soft. Bowel sounds are normal. She exhibits no distension and no mass. There is no tenderness. There is no rebound and no guarding.  Musculoskeletal: She exhibits no edema and no tenderness.  Lymphadenopathy:    She has no cervical adenopathy.  Neurological: She displays normal reflexes. No cranial nerve deficit. She exhibits normal muscle tone. Coordination normal.  Skin: No rash noted. No erythema.  Psychiatric: She has a normal mood and affect. Her behavior is normal. Judgment and thought content normal.    Lab Results  Component Value Date   WBC 6.6 03/19/2014   HGB 12.4 03/19/2014   HCT 36.7 03/19/2014   PLT 293.0 03/19/2014   GLUCOSE 102* 03/19/2014   CHOL 173 03/19/2014   TRIG 101.0 03/19/2014   HDL 69.50 03/19/2014   LDLCALC 83 03/19/2014   ALT 20 03/19/2014   AST 21 03/19/2014   NA 142 03/19/2014   K 5.1 03/19/2014   CL 104 03/19/2014   CREATININE 0.6 03/19/2014   BUN 14 03/19/2014   CO2 30 03/19/2014   TSH 2.16 03/19/2014         Assessment & Plan:

## 2014-03-20 DIAGNOSIS — R059 Cough, unspecified: Secondary | ICD-10-CM | POA: Insufficient documentation

## 2014-03-20 DIAGNOSIS — R05 Cough: Secondary | ICD-10-CM | POA: Insufficient documentation

## 2014-03-20 NOTE — Assessment & Plan Note (Signed)
Continue with current prn therapy as reflected on the Med list.

## 2014-03-20 NOTE — Assessment & Plan Note (Signed)
D/c supplements Treat allergies

## 2014-04-02 ENCOUNTER — Ambulatory Visit: Payer: PRIVATE HEALTH INSURANCE | Admitting: Pulmonary Disease

## 2014-04-23 ENCOUNTER — Other Ambulatory Visit: Payer: Self-pay | Admitting: *Deleted

## 2014-04-23 MED ORDER — SIMVASTATIN 20 MG PO TABS: ORAL_TABLET | ORAL | Status: DC

## 2014-04-23 MED ORDER — METOPROLOL TARTRATE 25 MG PO TABS: ORAL_TABLET | ORAL | Status: DC

## 2014-04-25 ENCOUNTER — Other Ambulatory Visit: Payer: Self-pay | Admitting: *Deleted

## 2014-04-25 MED ORDER — METOPROLOL TARTRATE 25 MG PO TABS: ORAL_TABLET | ORAL | Status: DC

## 2014-06-21 ENCOUNTER — Ambulatory Visit (INDEPENDENT_AMBULATORY_CARE_PROVIDER_SITE_OTHER): Payer: PRIVATE HEALTH INSURANCE | Admitting: Internal Medicine

## 2014-06-21 ENCOUNTER — Encounter: Payer: Self-pay | Admitting: Internal Medicine

## 2014-06-21 VITALS — BP 150/84 | HR 80 | Temp 97.9°F | Resp 16 | Wt 128.0 lb

## 2014-06-21 DIAGNOSIS — R030 Elevated blood-pressure reading, without diagnosis of hypertension: Secondary | ICD-10-CM

## 2014-06-21 DIAGNOSIS — K2100 Gastro-esophageal reflux disease with esophagitis, without bleeding: Secondary | ICD-10-CM

## 2014-06-21 DIAGNOSIS — R059 Cough, unspecified: Secondary | ICD-10-CM

## 2014-06-21 DIAGNOSIS — R05 Cough: Secondary | ICD-10-CM

## 2014-06-21 DIAGNOSIS — IMO0001 Reserved for inherently not codable concepts without codable children: Secondary | ICD-10-CM

## 2014-06-21 DIAGNOSIS — E785 Hyperlipidemia, unspecified: Secondary | ICD-10-CM

## 2014-06-21 MED ORDER — TRIAMCINOLONE ACETONIDE 0.5 % EX OINT: 1.0000 "application " | TOPICAL_OINTMENT | Freq: Two times a day (BID) | CUTANEOUS | Status: DC

## 2014-06-21 MED ORDER — RANITIDINE HCL 150 MG PO TABS: 150.0000 mg | ORAL_TABLET | Freq: Two times a day (BID) | ORAL | Status: DC

## 2014-06-21 NOTE — Assessment & Plan Note (Signed)
Nl BP at home 

## 2014-06-21 NOTE — Assessment & Plan Note (Signed)
Zantac 150 bid

## 2014-06-21 NOTE — Patient Instructions (Signed)
Gluten free trial (no wheat products) for 4-6 weeks. OK to use gluten-free bread and gluten-free pasta.  Milk free trial (no milk, ice cream, cheese and yogurt) for 4-6 weeks. OK to use almond, coconut, rice or soy milk. "Almond breeze" brand tastes good.  

## 2014-06-21 NOTE — Assessment & Plan Note (Signed)
Resolved

## 2014-06-21 NOTE — Assessment & Plan Note (Signed)
Continue with current prescription therapy as reflected on the Med list.  

## 2014-06-21 NOTE — Progress Notes (Signed)
Pre visit review using our clinic review tool, if applicable. No additional management support is needed unless otherwise documented below in the visit note. 

## 2014-06-21 NOTE — Progress Notes (Signed)
   Subjective:    HPI     The patient needs to address  chronic palpitations that has been well controlled with medicines; to address chronic  hyperlipidemia controlled with medicines as well SBP 115 at home C/o GERD sx's C/o rash  BP Readings from Last 3 Encounters:  06/21/14 150/84  03/19/14 158/84  01/22/13 142/60   Wt Readings from Last 3 Encounters:  06/21/14 128 lb (58.06 kg)  03/19/14 125 lb (56.7 kg)  01/22/13 129 lb 3.2 oz (58.605 kg)     Review of Systems  Constitutional: Negative for fever, chills, diaphoresis, activity change, appetite change, fatigue and unexpected weight change.  HENT: Negative for congestion, dental problem, ear pain, hearing loss, mouth sores, postnasal drip, sinus pressure, sneezing, sore throat and voice change.   Eyes: Negative for pain and visual disturbance.  Respiratory: Positive for cough. Negative for chest tightness, wheezing and stridor.   Cardiovascular: Negative for chest pain, palpitations and leg swelling.  Gastrointestinal: Negative for nausea, vomiting, abdominal pain, blood in stool, abdominal distention and rectal pain.  Genitourinary: Negative for dysuria, hematuria, decreased urine volume, vaginal bleeding, vaginal discharge, difficulty urinating, vaginal pain and menstrual problem.  Musculoskeletal: Negative for back pain, gait problem, joint swelling and neck pain.  Skin: Negative for color change, rash and wound.  Neurological: Negative for dizziness, tremors, syncope, speech difficulty and light-headedness.  Hematological: Negative for adenopathy.  Psychiatric/Behavioral: Negative for suicidal ideas, hallucinations, behavioral problems, confusion, sleep disturbance, dysphoric mood and decreased concentration. The patient is not hyperactive.        Objective:   Physical Exam  Constitutional: She appears well-developed. No distress.  HENT:  Head: Normocephalic.  Right Ear: External ear normal.  Left Ear: External  ear normal.  Nose: Nose normal.  Mouth/Throat: Oropharynx is clear and moist.  Eyes: Conjunctivae are normal. Pupils are equal, round, and reactive to light. Right eye exhibits no discharge. Left eye exhibits no discharge.  Neck: Normal range of motion. Neck supple. No JVD present. No tracheal deviation present. No thyromegaly present.  Cardiovascular: Normal rate, regular rhythm and normal heart sounds.   Pulmonary/Chest: No stridor. No respiratory distress. She has no wheezes.  Abdominal: Soft. Bowel sounds are normal. She exhibits no distension and no mass. There is no tenderness. There is no rebound and no guarding.  Musculoskeletal: She exhibits no edema and no tenderness.  Lymphadenopathy:    She has no cervical adenopathy.  Neurological: She displays normal reflexes. No cranial nerve deficit. She exhibits normal muscle tone. Coordination normal.  Skin: No rash noted. No erythema.  Psychiatric: She has a normal mood and affect. Her behavior is normal. Judgment and thought content normal.  hand eczema  Lab Results  Component Value Date   WBC 6.6 03/19/2014   HGB 12.4 03/19/2014   HCT 36.7 03/19/2014   PLT 293.0 03/19/2014   GLUCOSE 102* 03/19/2014   CHOL 173 03/19/2014   TRIG 101.0 03/19/2014   HDL 69.50 03/19/2014   LDLCALC 83 03/19/2014   ALT 20 03/19/2014   AST 21 03/19/2014   NA 142 03/19/2014   K 5.1 03/19/2014   CL 104 03/19/2014   CREATININE 0.6 03/19/2014   BUN 14 03/19/2014   CO2 30 03/19/2014   TSH 2.16 03/19/2014         Assessment & Plan:

## 2014-11-04 ENCOUNTER — Other Ambulatory Visit: Payer: Self-pay | Admitting: Internal Medicine

## 2014-12-18 ENCOUNTER — Encounter: Payer: Self-pay | Admitting: Internal Medicine

## 2015-01-03 ENCOUNTER — Ambulatory Visit (INDEPENDENT_AMBULATORY_CARE_PROVIDER_SITE_OTHER): Payer: PRIVATE HEALTH INSURANCE | Admitting: Internal Medicine

## 2015-01-03 ENCOUNTER — Other Ambulatory Visit (INDEPENDENT_AMBULATORY_CARE_PROVIDER_SITE_OTHER): Payer: PRIVATE HEALTH INSURANCE

## 2015-01-03 ENCOUNTER — Encounter: Payer: Self-pay | Admitting: Internal Medicine

## 2015-01-03 VITALS — BP 122/80 | HR 79 | Temp 97.9°F | Wt 130.0 lb

## 2015-01-03 DIAGNOSIS — R140 Abdominal distension (gaseous): Secondary | ICD-10-CM | POA: Insufficient documentation

## 2015-01-03 DIAGNOSIS — K219 Gastro-esophageal reflux disease without esophagitis: Secondary | ICD-10-CM

## 2015-01-03 DIAGNOSIS — I480 Paroxysmal atrial fibrillation: Secondary | ICD-10-CM

## 2015-01-03 DIAGNOSIS — E785 Hyperlipidemia, unspecified: Secondary | ICD-10-CM

## 2015-01-03 LAB — CBC WITH DIFFERENTIAL/PLATELET
Basophils Absolute: 0 10*3/uL (ref 0.0–0.1)
Basophils Relative: 0.3 % (ref 0.0–3.0)
EOS PCT: 0 % (ref 0.0–5.0)
Eosinophils Absolute: 0 10*3/uL (ref 0.0–0.7)
HCT: 43 % (ref 36.0–46.0)
Hemoglobin: 14.7 g/dL (ref 12.0–15.0)
LYMPHS ABS: 1.8 10*3/uL (ref 0.7–4.0)
Lymphocytes Relative: 25.8 % (ref 12.0–46.0)
MCHC: 34.2 g/dL (ref 30.0–36.0)
MCV: 94.7 fl (ref 78.0–100.0)
MONO ABS: 0.9 10*3/uL (ref 0.1–1.0)
MONOS PCT: 13.1 % — AB (ref 3.0–12.0)
Neutro Abs: 4.2 10*3/uL (ref 1.4–7.7)
Neutrophils Relative %: 60.8 % (ref 43.0–77.0)
PLATELETS: 304 10*3/uL (ref 150.0–400.0)
RBC: 4.54 Mil/uL (ref 3.87–5.11)
RDW: 13 % (ref 11.5–15.5)
WBC: 6.9 10*3/uL (ref 4.0–10.5)

## 2015-01-03 LAB — HEPATIC FUNCTION PANEL
ALK PHOS: 91 U/L (ref 39–117)
ALT: 15 U/L (ref 0–35)
AST: 19 U/L (ref 0–37)
Albumin: 4.4 g/dL (ref 3.5–5.2)
BILIRUBIN DIRECT: 0.1 mg/dL (ref 0.0–0.3)
TOTAL PROTEIN: 7.4 g/dL (ref 6.0–8.3)
Total Bilirubin: 0.7 mg/dL (ref 0.2–1.2)

## 2015-01-03 LAB — TSH: TSH: 2.12 u[IU]/mL (ref 0.35–4.50)

## 2015-01-03 NOTE — Assessment & Plan Note (Signed)
1/16 new ?etiology Labs Work up discussed: CT vs GI ref - pt would like to see a GI Dr Randa Evens Simvastatin x 2 wks

## 2015-01-03 NOTE — Progress Notes (Signed)
Subjective:    HPI    C/o gas and bloating - worse in am, x1-2 months. Pt has to buy different size pants. No change in diet, bowel habits... No blood in stool.   The patient needs to address  chronic palpitations that has been well controlled with medicines; to address chronic  hyperlipidemia controlled with medicines as well SBP 115 at home   BP Readings from Last 3 Encounters:  01/03/15 122/80  06/21/14 150/84  03/19/14 158/84   Wt Readings from Last 3 Encounters:  01/03/15 130 lb (58.968 kg)  06/21/14 128 lb (58.06 kg)  03/19/14 125 lb (56.7 kg)     Review of Systems  Constitutional: Negative for fever, chills, diaphoresis, activity change, appetite change, fatigue and unexpected weight change.  HENT: Negative for congestion, dental problem, ear pain, hearing loss, mouth sores, postnasal drip, sinus pressure, sneezing, sore throat and voice change.   Eyes: Negative for pain and visual disturbance.  Respiratory: Positive for cough. Negative for chest tightness, wheezing and stridor.   Cardiovascular: Negative for chest pain, palpitations and leg swelling.  Gastrointestinal: Negative for nausea, vomiting, abdominal pain, blood in stool, abdominal distention and rectal pain.  Genitourinary: Negative for dysuria, hematuria, decreased urine volume, vaginal bleeding, vaginal discharge, difficulty urinating, vaginal pain and menstrual problem.  Musculoskeletal: Negative for back pain, joint swelling, gait problem and neck pain.  Skin: Negative for color change, rash and wound.  Neurological: Negative for dizziness, tremors, syncope, speech difficulty and light-headedness.  Hematological: Negative for adenopathy.  Psychiatric/Behavioral: Negative for suicidal ideas, hallucinations, behavioral problems, confusion, sleep disturbance, dysphoric mood and decreased concentration. The patient is not hyperactive.        Objective:   Physical Exam  Constitutional: She appears  well-developed. No distress.  HENT:  Head: Normocephalic.  Right Ear: External ear normal.  Left Ear: External ear normal.  Nose: Nose normal.  Mouth/Throat: Oropharynx is clear and moist.  Eyes: Conjunctivae are normal. Pupils are equal, round, and reactive to light. Right eye exhibits no discharge. Left eye exhibits no discharge.  Neck: Normal range of motion. Neck supple. No JVD present. No tracheal deviation present. No thyromegaly present.  Cardiovascular: Normal rate, regular rhythm and normal heart sounds.   Pulmonary/Chest: No stridor. No respiratory distress. She has no wheezes.  Abdominal: Soft. Bowel sounds are normal. She exhibits no distension and no mass. There is no tenderness. There is no rebound and no guarding.  Musculoskeletal: She exhibits no edema or tenderness.  Lymphadenopathy:    She has no cervical adenopathy.  Neurological: She displays normal reflexes. No cranial nerve deficit. She exhibits normal muscle tone. Coordination normal.  Skin: No rash noted. No erythema.  Psychiatric: She has a normal mood and affect. Her behavior is normal. Judgment and thought content normal.  hand eczema abd - mo ascitis, no mass, NT  Lab Results  Component Value Date   WBC 6.6 03/19/2014   HGB 12.4 03/19/2014   HCT 36.7 03/19/2014   PLT 293.0 03/19/2014   GLUCOSE 102* 03/19/2014   CHOL 173 03/19/2014   TRIG 101.0 03/19/2014   HDL 69.50 03/19/2014   LDLCALC 83 03/19/2014   ALT 20 03/19/2014   AST 21 03/19/2014   NA 142 03/19/2014   K 5.1 03/19/2014   CL 104 03/19/2014   CREATININE 0.6 03/19/2014   BUN 14 03/19/2014   CO2 30 03/19/2014   TSH 2.16 03/19/2014         Assessment & Plan:

## 2015-01-03 NOTE — Assessment & Plan Note (Signed)
Continue with current prescription therapy as reflected on the Med list.  

## 2015-01-03 NOTE — Assessment & Plan Note (Signed)
As above.

## 2015-01-03 NOTE — Progress Notes (Signed)
Pre visit review using our clinic review tool, if applicable. No additional management support is needed unless otherwise documented below in the visit note. 

## 2015-01-23 ENCOUNTER — Encounter: Payer: Self-pay | Admitting: Internal Medicine

## 2015-02-05 ENCOUNTER — Ambulatory Visit: Payer: PRIVATE HEALTH INSURANCE | Admitting: Internal Medicine

## 2015-02-11 ENCOUNTER — Ambulatory Visit: Payer: PRIVATE HEALTH INSURANCE | Admitting: Physician Assistant

## 2015-03-19 ENCOUNTER — Ambulatory Visit: Payer: PRIVATE HEALTH INSURANCE | Admitting: Internal Medicine

## 2015-05-13 ENCOUNTER — Telehealth: Payer: Self-pay | Admitting: Internal Medicine

## 2015-05-13 MED ORDER — METOPROLOL TARTRATE 25 MG PO TABS: 12.5000 mg | ORAL_TABLET | Freq: Two times a day (BID) | ORAL | Status: DC

## 2015-05-13 NOTE — Telephone Encounter (Signed)
Pt called in and needs refill on her metoprolol tartrate (LOPRESSOR) 25 MG tablet    Sent to [facility] in [place].  She just switched over to medicare and needs new script.

## 2015-05-14 ENCOUNTER — Telehealth: Payer: Self-pay | Admitting: Internal Medicine

## 2015-05-14 MED ORDER — METOPROLOL TARTRATE 25 MG PO TABS: 12.5000 mg | ORAL_TABLET | Freq: Two times a day (BID) | ORAL | Status: DC

## 2015-05-14 NOTE — Telephone Encounter (Signed)
Prescription for metoprolol tartrate (LOPRESSOR) 25 MG tablet [[id number]] needs to be sent to [facility] in [place]

## 2015-05-14 NOTE — Telephone Encounter (Signed)
Refill has been sent to pharmacy...Johny Chess

## 2015-06-04 ENCOUNTER — Other Ambulatory Visit: Payer: Self-pay

## 2015-06-04 ENCOUNTER — Telehealth: Payer: Self-pay | Admitting: Internal Medicine

## 2015-06-04 MED ORDER — RANITIDINE HCL 150 MG PO TABS: 150.0000 mg | ORAL_TABLET | Freq: Two times a day (BID) | ORAL | Status: DC

## 2015-06-04 MED ORDER — SIMVASTATIN 20 MG PO TABS: 20.0000 mg | ORAL_TABLET | Freq: Every day | ORAL | Status: DC

## 2015-06-04 NOTE — Telephone Encounter (Signed)
Patient needs refill for simvastatin (ZOCOR) 20 MG tablet [[id number] and ranitidine (ZANTAC) 150 MG tablet [[id number].  Pharmacy is [facility] in [place], [place] Patient has an appointment on 7/29 but will need this medication before then

## 2015-06-04 NOTE — Telephone Encounter (Signed)
Rx refills sent to designated pharmacy

## 2015-07-04 ENCOUNTER — Ambulatory Visit (INDEPENDENT_AMBULATORY_CARE_PROVIDER_SITE_OTHER): Payer: PPO | Admitting: Internal Medicine

## 2015-07-04 ENCOUNTER — Other Ambulatory Visit (INDEPENDENT_AMBULATORY_CARE_PROVIDER_SITE_OTHER): Payer: PPO

## 2015-07-04 ENCOUNTER — Encounter: Payer: Self-pay | Admitting: Internal Medicine

## 2015-07-04 VITALS — BP 138/90 | HR 76 | Ht 63.0 in | Wt 129.0 lb

## 2015-07-04 DIAGNOSIS — Z23 Encounter for immunization: Secondary | ICD-10-CM | POA: Diagnosis not present

## 2015-07-04 DIAGNOSIS — Z136 Encounter for screening for cardiovascular disorders: Secondary | ICD-10-CM | POA: Diagnosis not present

## 2015-07-04 DIAGNOSIS — Z0000 Encounter for general adult medical examination without abnormal findings: Secondary | ICD-10-CM

## 2015-07-04 DIAGNOSIS — M8580 Other specified disorders of bone density and structure, unspecified site: Secondary | ICD-10-CM

## 2015-07-04 DIAGNOSIS — E785 Hyperlipidemia, unspecified: Secondary | ICD-10-CM

## 2015-07-04 DIAGNOSIS — H6123 Impacted cerumen, bilateral: Secondary | ICD-10-CM | POA: Diagnosis not present

## 2015-07-04 DIAGNOSIS — I480 Paroxysmal atrial fibrillation: Secondary | ICD-10-CM

## 2015-07-04 DIAGNOSIS — H6120 Impacted cerumen, unspecified ear: Secondary | ICD-10-CM | POA: Insufficient documentation

## 2015-07-04 LAB — CBC WITH DIFFERENTIAL/PLATELET
Basophils Absolute: 0 10*3/uL (ref 0.0–0.1)
Basophils Relative: 0.3 % (ref 0.0–3.0)
Eosinophils Absolute: 0 10*3/uL (ref 0.0–0.7)
Eosinophils Relative: 0 % (ref 0.0–5.0)
HCT: 41 % (ref 36.0–46.0)
HEMOGLOBIN: 14.1 g/dL (ref 12.0–15.0)
LYMPHS ABS: 1.8 10*3/uL (ref 0.7–4.0)
LYMPHS PCT: 23.4 % (ref 12.0–46.0)
MCHC: 34.3 g/dL (ref 30.0–36.0)
MCV: 96.6 fl (ref 78.0–100.0)
MONO ABS: 0.8 10*3/uL (ref 0.1–1.0)
Monocytes Relative: 10.5 % (ref 3.0–12.0)
NEUTROS ABS: 5 10*3/uL (ref 1.4–7.7)
Neutrophils Relative %: 65.8 % (ref 43.0–77.0)
PLATELETS: 283 10*3/uL (ref 150.0–400.0)
RBC: 4.24 Mil/uL (ref 3.87–5.11)
RDW: 12.9 % (ref 11.5–15.5)
WBC: 7.6 10*3/uL (ref 4.0–10.5)

## 2015-07-04 LAB — LIPID PANEL
CHOL/HDL RATIO: 3
Cholesterol: 167 mg/dL (ref 0–200)
HDL: 65.5 mg/dL (ref >39.00)
LDL CALC: 78 mg/dL (ref 0–99)
NonHDL: 101.17
TRIGLYCERIDES: 117 mg/dL (ref 0.0–149.0)
VLDL: 23.4 mg/dL (ref 0.0–40.0)

## 2015-07-04 LAB — BASIC METABOLIC PANEL
BUN: 11 mg/dL (ref 6–23)
CHLORIDE: 103 meq/L (ref 96–112)
CO2: 31 mEq/L (ref 19–32)
CREATININE: 0.74 mg/dL (ref 0.40–1.20)
Calcium: 10 mg/dL (ref 8.4–10.5)
GFR: 82.75 mL/min (ref >60.00)
Glucose, Bld: 106 mg/dL — ABNORMAL HIGH (ref 70–99)
Potassium: 5.1 mEq/L (ref 3.5–5.1)
SODIUM: 140 meq/L (ref 135–145)

## 2015-07-04 LAB — URINALYSIS, ROUTINE W REFLEX MICROSCOPIC
Bilirubin Urine: NEGATIVE
KETONES UR: NEGATIVE
NITRITE: NEGATIVE
SPECIFIC GRAVITY, URINE: 1.015 (ref 1.000–1.030)
Total Protein, Urine: NEGATIVE
URINE GLUCOSE: NEGATIVE
Urobilinogen, UA: 1 (ref 0.0–1.0)
pH: 7 (ref 5.0–8.0)

## 2015-07-04 LAB — HEPATIC FUNCTION PANEL
ALBUMIN: 4.3 g/dL (ref 3.5–5.2)
ALT: 14 U/L (ref 0–35)
AST: 20 U/L (ref 0–37)
Alkaline Phosphatase: 85 U/L (ref 39–117)
BILIRUBIN DIRECT: 0.1 mg/dL (ref 0.0–0.3)
TOTAL PROTEIN: 7.4 g/dL (ref 6.0–8.3)
Total Bilirubin: 0.7 mg/dL (ref 0.2–1.2)

## 2015-07-04 LAB — TSH: TSH: 1.85 u[IU]/mL (ref 0.35–4.50)

## 2015-07-04 MED ORDER — TRIAMCINOLONE ACETONIDE 0.5 % EX OINT: 1.0000 "application " | TOPICAL_OINTMENT | Freq: Two times a day (BID) | CUTANEOUS | Status: DC

## 2015-07-04 MED ORDER — RIVAROXABAN 20 MG PO TABS: 20.0000 mg | ORAL_TABLET | Freq: Every day | ORAL | Status: DC

## 2015-07-04 MED ORDER — ASPIRIN 325 MG PO TABS: 325.0000 mg | ORAL_TABLET | Freq: Every day | ORAL | Status: DC

## 2015-07-04 NOTE — Progress Notes (Signed)
Pre visit review using our clinic review tool, if applicable. No additional management support is needed unless otherwise documented below in the visit note. 

## 2015-07-04 NOTE — Assessment & Plan Note (Signed)
We discussed age appropriate health related issues, including available/recomended screening tests and vaccinations. We discussed a need for adhering to healthy diet and exercise. Labs/EKG were reviewed/ordered. All questions were answered. Pneumovax

## 2015-07-04 NOTE — Patient Instructions (Addendum)
Melatonin for jet lag Valerian root - a mild sedative    Atrial Fibrillation Atrial fibrillation is a type of irregular heart rhythm (arrhythmia). During atrial fibrillation, the upper chambers of the heart (atria) quiver continuously in a chaotic pattern. This causes an irregular and often rapid heart rate.  Atrial fibrillation is the result of the heart becoming overloaded with disorganized signals that tell it to beat. These signals are normally released one at a time by a part of the right atrium called the sinoatrial node. They then travel from the atria to the lower chambers of the heart (ventricles), causing the atria and ventricles to contract and pump blood as they pass. In atrial fibrillation, parts of the atria outside of the sinoatrial node also release these signals. This results in two problems. First, the atria receive so many signals that they do not have time to fully contract. Second, the ventricles, which can only receive one signal at a time, beat irregularly and out of rhythm with the atria.  There are three types of atrial fibrillation:   Paroxysmal. Paroxysmal atrial fibrillation starts suddenly and stops on its own within a week.  Persistent. Persistent atrial fibrillation lasts for more than a week. It may stop on its own or with treatment.  Permanent. Permanent atrial fibrillation does not go away. Episodes of atrial fibrillation may lead to permanent atrial fibrillation. Atrial fibrillation can prevent your heart from pumping blood normally. It increases your risk of stroke and can lead to heart failure.  CAUSES   Heart conditions, including a heart attack, heart failure, coronary artery disease, and heart valve conditions.   Inflammation of the sac that surrounds the heart (pericarditis).  Blockage of an artery in the lungs (pulmonary embolism).  Pneumonia or other infections.  Chronic lung disease.  Thyroid problems, especially if the thyroid is overactive  (hyperthyroidism).  Caffeine, excessive alcohol use, and use of some illegal drugs.   Use of some medicines, including certain decongestants and diet pills.  Heart surgery.   Birth defects.  Sometimes, no cause can be found. When this happens, the atrial fibrillation is called lone atrial fibrillation. The risk of complications from atrial fibrillation increases if you have lone atrial fibrillation and you are age 69 years or older. RISK FACTORS  Heart failure.  Coronary artery disease.  Diabetes mellitus.   High blood pressure (hypertension).   Obesity.   Other arrhythmias.   Increased age. SIGNS AND SYMPTOMS   A feeling that your heart is beating rapidly or irregularly.   A feeling of discomfort or pain in your chest.   Shortness of breath.   Sudden light-headedness or weakness.   Getting tired easily when exercising.   Urinating more often than normal (mainly when atrial fibrillation first begins).  In paroxysmal atrial fibrillation, symptoms may start and suddenly stop. DIAGNOSIS  Your health care provider may be able to detect atrial fibrillation when taking your pulse. Your health care provider may have you take a test called an ambulatory electrocardiogram (ECG). An ECG records your heartbeat patterns over a 24-hour period. You may also have other tests, such as:  Transthoracic echocardiogram (TTE). During echocardiography, sound waves are used to evaluate how blood flows through your heart.  Transesophageal echocardiogram (TEE).  Stress test. There is more than one type of stress test. If a stress test is needed, ask your health care provider about which type is best for you.  Chest X-ray exam.  Blood tests.  Computed tomography (CT). TREATMENT  Treatment may include:  Treating any underlying conditions. For example, if you have an overactive thyroid, treating the condition may correct atrial fibrillation.  Taking medicine. Medicines may  be given to control a rapid heart rate or to prevent blood clots, heart failure, or a stroke.  Having a procedure to correct the rhythm of the heart:  Electrical cardioversion. During electrical cardioversion, a controlled, low-energy shock is delivered to the heart through your skin. If you have chest pain, very low blood pressure, or sudden heart failure, this procedure may need to be done as an emergency.  Catheter ablation. During this procedure, heart tissues that send the signals that cause atrial fibrillation are destroyed.  Surgical ablation. During this surgery, thin lines of heart tissue that carry the abnormal signals are destroyed. This procedure can either be an open-heart surgery or a minimally invasive surgery. With the minimally invasive surgery, small cuts are made to access the heart instead of a large opening.  Pulmonary venous isolation. During this surgery, tissue around the veins that carry blood from the lungs (pulmonary veins) is destroyed. This tissue is thought to carry the abnormal signals. HOME CARE INSTRUCTIONS   Take medicines only as directed by your health care provider. Some medicines can make atrial fibrillation worse or recur.  If blood thinners were prescribed by your health care provider, take them exactly as directed. Too much blood-thinning medicine can cause bleeding. If you take too little, you will not have the needed protection against stroke and other problems.  Perform blood tests at home if directed by your health care provider. Perform blood tests exactly as directed.  Quit smoking if you smoke.  Do not drink alcohol.  Do not drink caffeinated beverages such as coffee, soda, and some teas. You may drink decaffeinated coffee, soda, or tea.   Maintain a healthy weight.Do not use diet pills unless your health care provider approves. They may make heart problems worse.   Follow diet instructions as directed by your health care  provider.  Exercise regularly as directed by your health care provider.  Keep all follow-up visits as directed by your health care provider. This is important. PREVENTION  The following substances can cause atrial fibrillation to recur:   Caffeinated beverages.  Alcohol.  Certain medicines, especially those used for breathing problems.  Certain herbs and herbal medicines, such as those containing ephedra or ginseng.  Illegal drugs, such as cocaine and amphetamines. Sometimes medicines are given to prevent atrial fibrillation from recurring. Proper treatment of any underlying condition is also important in helping prevent recurrence.  SEEK MEDICAL CARE IF:  You notice a change in the rate, rhythm, or strength of your heartbeat.  You suddenly begin urinating more frequently.  You tire more easily when exerting yourself or exercising. SEEK IMMEDIATE MEDICAL CARE IF:   You have chest pain, abdominal pain, sweating, or weakness.  You feel nauseous.  You have shortness of breath.  You suddenly have swollen feet and ankles.  You feel dizzy.  Your face or limbs feel numb or weak.  You have a change in your vision or speech. MAKE SURE YOU:   Understand these instructions.  Will watch your condition.  Will get help right away if you are not doing well or get worse. Document Released: 11/22/2005 Document Revised: 04/08/2014 Document Reviewed: 01/02/2013 [facility] Patient Information 2015 [place], [place]. This information is not intended to replace advice given to you by your health care provider. Make sure you discuss any questions you  have with your health care provider.  

## 2015-07-04 NOTE — Assessment & Plan Note (Addendum)
Recurrent, asymptomatic I suggested to start Xarelto 20 mg qd (samples and Rx given).  Potential benefits of a long term Xarelto use as well as potential risks  and complications were explained to the patient and were aknowledged. She is not decided yet. Will get a cardiology consult next week.  Another option ASA 325 mg/d discussed as well.   Card ref Full Aspirin for now

## 2015-07-04 NOTE — Assessment & Plan Note (Signed)
See Procedure 

## 2015-07-04 NOTE — Progress Notes (Signed)
Subjective:  Patient ID: Vicki [name], female    DOB: February 03, 1946  Age: 69 y.o. MRN: [id number]  CC: No chief complaint on file.   HPI Vicki [name] presents for a well exam.  F/u PAF, dyslipidemia, GERD. Pt is going to [place] - Aug 16th  Outpatient Prescriptions Prior to Visit  Medication Sig Dispense Refill  . loratadine (CLARITIN) 10 MG tablet Take 10 mg by mouth daily.    . metoprolol tartrate (LOPRESSOR) 25 MG tablet Take 0.5 tablets (12.5 mg total) by mouth 2 (two) times daily. 90 tablet 0  . ranitidine (ZANTAC) 150 MG tablet Take 1 tablet (150 mg total) by mouth 2 (two) times daily. 180 tablet 3  . simvastatin (ZOCOR) 20 MG tablet Take 1 tablet (20 mg total) by mouth at bedtime. 90 tablet 2  . aspirin 81 MG tablet Take 81 mg by mouth daily.    Marland Kitchen triamcinolone ointment (KENALOG) 0.5 % Apply 1 application topically 2 (two) times daily. Apply to hand rash 60 g 3   No facility-administered medications prior to visit.    ROS Review of Systems  Constitutional: Negative for chills, diaphoresis, activity change, appetite change, fatigue and unexpected weight change.  HENT: Negative for congestion, mouth sores, nosebleeds, sinus pressure and tinnitus.   Eyes: Negative for visual disturbance.  Respiratory: Negative for cough and chest tightness.   Gastrointestinal: Negative for nausea and abdominal pain.  Genitourinary: Negative for frequency, vaginal bleeding, difficulty urinating, vaginal pain and pelvic pain.  Musculoskeletal: Negative for back pain and gait problem.  Skin: Negative for pallor and rash.  Allergic/Immunologic: Negative for environmental allergies.  Neurological: Negative for dizziness, tremors, weakness, numbness and headaches.  Psychiatric/Behavioral: Negative for suicidal ideas, confusion and sleep disturbance. The patient is not nervous/anxious.     Objective:  BP 138/90 mmHg  Pulse 76  Ht 5\' 3"  (1.6 m)  Wt 129 lb (58.514 kg)  BMI 22.86 kg/m2   SpO2 97%  BP Readings from Last 3 Encounters:  07/04/15 138/90  01/03/15 122/80  06/21/14 150/84    Wt Readings from Last 3 Encounters:  07/04/15 129 lb (58.514 kg)  01/03/15 130 lb (58.968 kg)  06/21/14 128 lb (58.06 kg)    Physical Exam  Constitutional: She appears well-developed. No distress.  HENT:  Head: Normocephalic.  Right Ear: External ear normal.  Left Ear: External ear normal.  Nose: Nose normal.  Mouth/Throat: Oropharynx is clear and moist.  Eyes: Conjunctivae are normal. Pupils are equal, round, and reactive to light. Right eye exhibits no discharge. Left eye exhibits no discharge.  Neck: Normal range of motion. Neck supple. No JVD present. No tracheal deviation present. No thyromegaly present.  Cardiovascular: Normal rate and normal heart sounds.   Irregular RR  Pulmonary/Chest: No stridor. No respiratory distress. She has no wheezes.  Abdominal: Soft. Bowel sounds are normal. She exhibits no distension and no mass. There is no tenderness. There is no rebound and no guarding.  Musculoskeletal: She exhibits no edema or tenderness.  Lymphadenopathy:    She has no cervical adenopathy.  Neurological: She displays normal reflexes. No cranial nerve deficit. She exhibits normal muscle tone. Coordination normal.  Skin: No rash noted. No erythema.  Psychiatric: She has a normal mood and affect. Her behavior is normal. Judgment and thought content normal.  B wax  Lab Results  Component Value Date   WBC 7.6 07/04/2015   HGB 14.1 07/04/2015   HCT 41.0 07/04/2015   PLT 283.0 07/04/2015  GLUCOSE 106* 07/04/2015   CHOL 167 07/04/2015   TRIG 117.0 07/04/2015   HDL 65.50 07/04/2015   LDLCALC 78 07/04/2015   ALT 14 07/04/2015   AST 20 07/04/2015   NA 140 07/04/2015   K 5.1 07/04/2015   CL 103 07/04/2015   CREATININE 0.74 07/04/2015   BUN 11 07/04/2015   CO2 31 07/04/2015   TSH 1.85 07/04/2015    EKG: A.fib/flutter    Procedure Note :     Procedure :  Ear  irrigation   Indication:  Cerumen impaction   Risks, including pain, dizziness, eardrum perforation, bleeding, infection and others as well as benefits were explained to the patient in detail. Verbal consent was obtained and the patient agreed to proceed.    We used "The Elephant Ear Irrigation Device" filled with lukewarm water for irrigation. A large amount wax was recovered. Procedure has also required manual wax removal with an ear loop.   Tolerated well. Complications: None.   Postprocedure instructions :  Call if problems.   No results found.  Assessment & Plan:   Diagnoses and all orders for this visit:  Well adult exam Orders: -     TSH; Future -     Basic metabolic panel; Future -     CBC with Differential/Platelet; Future -     Hepatic function panel; Future -     Lipid panel; Future -     Urinalysis; Future  Osteopenia  Dyslipidemia  Cerumen impaction, bilateral  Screening for ischemic heart disease Orders: -     EKG 12-Lead  Need for TD vaccine Orders: -     Td vaccine greater than or equal to 7yo preservative free IM  Need for prophylactic vaccination against Streptococcus pneumoniae (pneumococcus) Orders: -     Pneumococcal conjugate vaccine 13-valent IM  Paroxysmal atrial fibrillation Orders: -     Ambulatory referral to Cardiology  Other orders -     triamcinolone ointment (KENALOG) 0.5 %; Apply 1 application topically 2 (two) times daily. Apply to hand rash -     aspirin (BAYER ASPIRIN) 325 MG tablet; Take 1 tablet (325 mg total) by mouth daily. -     rivaroxaban (XARELTO) 20 MG TABS tablet; Take 1 tablet (20 mg total) by mouth daily.   I have discontinued Ms. Bobo aspirin. I am also having her start on aspirin and rivaroxaban. Additionally, I am having her maintain her loratadine, metoprolol tartrate, simvastatin, ranitidine, and triamcinolone ointment.  Meds ordered this encounter  Medications  . triamcinolone ointment (KENALOG) 0.5 %     Sig: Apply 1 application topically 2 (two) times daily. Apply to hand rash    Dispense:  60 g    Refill:  3  . aspirin (BAYER ASPIRIN) 325 MG tablet    Sig: Take 1 tablet (325 mg total) by mouth daily.    Dispense:  100 tablet    Refill:  3  . rivaroxaban (XARELTO) 20 MG TABS tablet    Sig: Take 1 tablet (20 mg total) by mouth daily.    Dispense:  30 tablet    Refill:  11     Follow-up: Return in about 4 weeks (around 08/01/2015) for a follow-up visit.  Walker Kehr, MD

## 2015-07-04 NOTE — Assessment & Plan Note (Signed)
On Vit D3 

## 2015-07-05 ENCOUNTER — Other Ambulatory Visit: Payer: Self-pay | Admitting: Internal Medicine

## 2015-07-05 MED ORDER — CEFUROXIME AXETIL 250 MG PO TABS: 250.0000 mg | ORAL_TABLET | Freq: Two times a day (BID) | ORAL | Status: DC

## 2015-07-07 ENCOUNTER — Ambulatory Visit (INDEPENDENT_AMBULATORY_CARE_PROVIDER_SITE_OTHER): Payer: PPO | Admitting: Cardiovascular Disease

## 2015-07-07 ENCOUNTER — Encounter: Payer: Self-pay | Admitting: Cardiovascular Disease

## 2015-07-07 DIAGNOSIS — R0602 Shortness of breath: Secondary | ICD-10-CM

## 2015-07-07 DIAGNOSIS — I4891 Unspecified atrial fibrillation: Secondary | ICD-10-CM

## 2015-07-07 NOTE — Assessment & Plan Note (Signed)
History of hyperlipidemia on simvastatin with her most recent lipid profile performed 07/04/15 revealing a total cholesterol 167, LDL 78 and HDL of 65.

## 2015-07-07 NOTE — Progress Notes (Signed)
07/07/2015 Vicki [name]   October 29, 1946  [id number]  Primary Physician Walker Kehr, MD Primary Cardiologist: Lorretta Harp MD Renae Gloss   HPI:  Vicki [name] is a 69 year old thin-appearing Caucasian female mother of one, grandmother and 6 grandchildren referred by Dr. Alain Marion  for cardiovascular evaluation because of newly recognized atrial fibrillation. She works in [employer]. She basically has no chronic risk factors other than hyperlipidemia and family history. Both her mother and father had ischemic heart disease. She has never had a heart attack or stroke. She did poorly have a total fibrillation briefly a +5 years ago and was treated with a low-dose beta blocker which she remains on. She does complain of occasional dyspnea. She saw her primary care physician who noted an irregular heart rhythm on exam and got an EKG that showed atrial fibrillation with a controlled ventricular response. He placed her on an oral anticoagulant (Xarelto). The CHA2DSVASC2 score is 2  .   Current Outpatient Prescriptions  Medication Sig Dispense Refill  . cefUROXime (CEFTIN) 250 MG tablet Take 1 tablet (250 mg total) by mouth 2 (two) times daily. 10 tablet 0  . loratadine (CLARITIN) 10 MG tablet Take 10 mg by mouth daily.    . metoprolol tartrate (LOPRESSOR) 25 MG tablet Take 0.5 tablets (12.5 mg total) by mouth 2 (two) times daily. 90 tablet 0  . ranitidine (ZANTAC) 150 MG tablet Take 1 tablet (150 mg total) by mouth 2 (two) times daily. 180 tablet 3  . rivaroxaban (XARELTO) 20 MG TABS tablet Take 1 tablet (20 mg total) by mouth daily. 30 tablet 11  . simvastatin (ZOCOR) 20 MG tablet Take 1 tablet (20 mg total) by mouth at bedtime. 90 tablet 2  . triamcinolone ointment (KENALOG) 0.5 % Apply 1 application topically 2 (two) times daily. Apply to hand rash 60 g 3   No current facility-administered medications for this visit.    Allergies  Allergen Reactions  . Codeine    REACTION: unknown    History   Social History  . Marital Status: Married    Spouse Name: N/A  . Number of Children: 1  . Years of Education: N/A   Occupational History  . [employer]   Social History Main Topics  . Smoking status: Former Smoker    Types: Cigarettes    Quit date: 12/06/1976  . Smokeless tobacco: Not on file  . Alcohol Use: No  . Drug Use: No  . Sexual Activity: Not on file   Other Topics Concern  . Not on file   Social History Narrative     Review of Systems: General: negative for chills, fever, night sweats or weight changes.  Cardiovascular: negative for chest pain, dyspnea on exertion, edema, orthopnea, palpitations, paroxysmal nocturnal dyspnea or shortness of breath Dermatological: negative for rash Respiratory: negative for cough or wheezing Urologic: negative for hematuria Abdominal: negative for nausea, vomiting, diarrhea, bright red blood per rectum, melena, or hematemesis Neurologic: negative for visual changes, syncope, or dizziness All other systems reviewed and are otherwise negative except as noted above.    Blood pressure 126/74, pulse 76, height 5\' 3"  (1.6 m), weight 131 lb 9.6 oz (59.693 kg).  General appearance: alert and no distress Neck: no adenopathy, no carotid bruit, no JVD, supple, symmetrical, trachea midline and thyroid not enlarged, symmetric, no tenderness/mass/nodules Lungs: clear to auscultation bilaterally Heart: irregularly irregular rhythm Extremities: extremities normal, atraumatic, no cyanosis or edema  EKG not performed today. I reviewed  the EKG performed by Dr. Alain Marion and agree with the interpretation.  ASSESSMENT AND PLAN:   Dyslipidemia History of hyperlipidemia on simvastatin with her most recent lipid profile performed 07/04/15 revealing a total cholesterol 167, LDL 78 and HDL of 65.  PAROXYSMAL ATRIAL FIBRILLATION [name] had PAF prostate 5 years ago and treated with low-dose beta  blocker. She saw Dr. Alain Marion  in the office recently and was in A. Fib with a controlled ventricular response. She was begun on Xarelto oral anti-coagulation for stroke prophylaxis.Her CHA2DSVASC2 score is  2 . She does complain of some mild dyspnea. There is also history of aortic insufficiency the past. She is scheduled to go out of the country in 2 weeks on vacation. I have encouraged her to continue her anticoagulant. I'm going to obtain a 2-D echocardiogram and a follow-up Myoview stress test.       Lorretta Harp MD [facility], [facility] 07/07/2015 4:14 PM

## 2015-07-07 NOTE — Patient Instructions (Signed)
Your physician wants you to follow-up in: 6 Months. You will receive a reminder letter in the mail two months in advance. If you don't receive a letter, please call our office to schedule the follow-up appointment.  Your physician has requested that you have an echocardiogram. Echocardiography is a painless test that uses sound waves to create images of your heart. It provides your doctor with information about the size and shape of your heart and how well your heart's chambers and valves are working. This procedure takes approximately one hour. There are no restrictions for this procedure.  Your physician has requested that you have a lexiscan myoview. For further information please visit [email or web address]. Please follow instruction sheet, as given.

## 2015-07-07 NOTE — Assessment & Plan Note (Signed)
Vicki [name] had PAF prostate 5 years ago and treated with low-dose beta blocker. She saw Dr. Alain Marion  in the office recently and was in A. Fib with a controlled ventricular response. She was begun on Xarelto oral anti-coagulation for stroke prophylaxis.Her CHA2DSVASC2 score is  2 . She does complain of some mild dyspnea. There is also history of aortic insufficiency the past. She is scheduled to go out of the country in 2 weeks on vacation. I have encouraged her to continue her anticoagulant. I'm going to obtain a 2-D echocardiogram and a follow-up Myoview stress test.

## 2015-07-08 ENCOUNTER — Telehealth (HOSPITAL_COMMUNITY): Payer: Self-pay

## 2015-07-08 NOTE — Telephone Encounter (Signed)
Encounter complete. 

## 2015-07-09 ENCOUNTER — Ambulatory Visit (HOSPITAL_BASED_OUTPATIENT_CLINIC_OR_DEPARTMENT_OTHER)
Admission: RE | Admit: 2015-07-09 | Discharge: 2015-07-09 | Disposition: A | Discharge status: Home / Self Care | Payer: PPO | Source: Ambulatory Visit | Attending: Cardiovascular Disease | Admitting: Cardiovascular Disease

## 2015-07-09 ENCOUNTER — Ambulatory Visit (HOSPITAL_COMMUNITY)
Admission: RE | Admit: 2015-07-09 | Discharge: 2015-07-09 | Disposition: A | Discharge status: Home / Self Care | Payer: PPO | Source: Ambulatory Visit | Attending: Cardiovascular Disease | Admitting: Cardiovascular Disease

## 2015-07-09 DIAGNOSIS — R0602 Shortness of breath: Secondary | ICD-10-CM

## 2015-07-09 DIAGNOSIS — I059 Rheumatic mitral valve disease, unspecified: Secondary | ICD-10-CM | POA: Insufficient documentation

## 2015-07-09 DIAGNOSIS — I313 Pericardial effusion (noninflammatory): Secondary | ICD-10-CM | POA: Insufficient documentation

## 2015-07-09 DIAGNOSIS — I351 Nonrheumatic aortic (valve) insufficiency: Secondary | ICD-10-CM | POA: Insufficient documentation

## 2015-07-09 DIAGNOSIS — I341 Nonrheumatic mitral (valve) prolapse: Secondary | ICD-10-CM | POA: Insufficient documentation

## 2015-07-09 DIAGNOSIS — I4891 Unspecified atrial fibrillation: Secondary | ICD-10-CM

## 2015-07-09 DIAGNOSIS — R9439 Abnormal result of other cardiovascular function study: Secondary | ICD-10-CM | POA: Insufficient documentation

## 2015-07-09 DIAGNOSIS — I340 Nonrheumatic mitral (valve) insufficiency: Secondary | ICD-10-CM | POA: Insufficient documentation

## 2015-07-09 DIAGNOSIS — R0600 Dyspnea, unspecified: Secondary | ICD-10-CM | POA: Diagnosis present

## 2015-07-09 LAB — MYOCARDIAL PERFUSION IMAGING
CHL CUP NUCLEAR SDS: 10
CHL CUP NUCLEAR SRS: 2
LV sys vol: 14 mL
LVDIAVOL: 53 mL
Peak HR: 130 {beats}/min
Rest HR: 75 {beats}/min
SSS: 12
TID: 0.97

## 2015-07-09 MED ORDER — AMINOPHYLLINE 25 MG/ML IV SOLN
75.0000 mg | Freq: Once | INTRAVENOUS | Status: AC
  Administered 2015-07-09: 75 mg via INTRAVENOUS

## 2015-07-09 MED ORDER — TECHNETIUM TC 99M SESTAMIBI GENERIC - CARDIOLITE
30.9000 | Freq: Once | INTRAVENOUS | Status: AC | PRN
  Administered 2015-07-09: 30.9 via INTRAVENOUS

## 2015-07-09 MED ORDER — TECHNETIUM TC 99M SESTAMIBI GENERIC - CARDIOLITE
10.6000 | Freq: Once | INTRAVENOUS | Status: AC | PRN
  Administered 2015-07-09: 11 via INTRAVENOUS

## 2015-07-09 MED ORDER — REGADENOSON 0.4 MG/5ML IV SOLN
0.4000 mg | Freq: Once | INTRAVENOUS | Status: AC
  Administered 2015-07-09: 0.4 mg via INTRAVENOUS

## 2015-07-14 ENCOUNTER — Encounter: Payer: Self-pay | Admitting: Cardiovascular Disease

## 2015-07-14 ENCOUNTER — Ambulatory Visit (INDEPENDENT_AMBULATORY_CARE_PROVIDER_SITE_OTHER): Payer: PPO | Admitting: Cardiovascular Disease

## 2015-07-14 VITALS — BP 132/82 | HR 77 | Ht 63.0 in | Wt 130.7 lb

## 2015-07-14 DIAGNOSIS — D689 Coagulation defect, unspecified: Secondary | ICD-10-CM | POA: Diagnosis not present

## 2015-07-14 DIAGNOSIS — Z01818 Encounter for other preprocedural examination: Secondary | ICD-10-CM | POA: Diagnosis not present

## 2015-07-14 DIAGNOSIS — I4891 Unspecified atrial fibrillation: Secondary | ICD-10-CM

## 2015-07-14 NOTE — Assessment & Plan Note (Signed)
Vicki [name] returns today for follow-up of her outpatient studies. 2-D echo revealed normal LV function, mild AI, normal size left atrium. A Myoview stress test showed mild low anteroapical ischemia. She has been on Xarelto for a little over a week. Her complaints are fatigue and some dyspnea. I think it's okay for her to go and occasional country. I do not think she needs chronic catheterization. I'll plan on performing outpatient DC cardioversion upon her return.

## 2015-07-14 NOTE — Progress Notes (Signed)
Vicki [name] returns today for follow-up of her outpatient studies. 2-D echo revealed normal LV function, mild AI, normal size left atrium. A Myoview stress test showed mild low anteroapical ischemia. She has been on Xarelto for a little over a week. Her complaints are fatigue and some dyspnea. I think it's okay for her to go and occasional country. I do not think she needs chronic catheterization. I'll plan on performing outpatient DC cardioversion upon her return.   Lorretta Harp, M.D., [place], [facility], Laver[place] [address] [place], [place]  [address]  [phone] 07/14/2015 3:35 PM

## 2015-07-14 NOTE — Patient Instructions (Signed)
Your physician has recommended that you have a Cardioversion (DCCV). Electrical Cardioversion uses a jolt of electricity to your heart either through paddles or wired patches attached to your chest. This is a controlled, usually prescheduled, procedure. Defibrillation is done under light anesthesia in the hospital, and you usually go home the day of the procedure. This is done to get your heart back into a normal rhythm. You are not awake for the procedure. Please see the instruction sheet given to you today.  Your physician recommends that you return for lab work   A chest x-ray takes a picture of the organs and structures inside the chest, including the heart, lungs, and blood vessels. This test can show several things, including, whether the heart is enlarges; whether fluid is building up in the lungs; and whether pacemaker / defibrillator leads are still in place.

## 2015-07-15 ENCOUNTER — Other Ambulatory Visit: Payer: Self-pay | Admitting: *Deleted

## 2015-07-15 DIAGNOSIS — Z01810 Encounter for preprocedural cardiovascular examination: Secondary | ICD-10-CM

## 2015-08-05 ENCOUNTER — Telehealth: Payer: Self-pay | Admitting: Cardiovascular Disease

## 2015-08-05 NOTE — Telephone Encounter (Signed)
SPOKE TO PATIENT  SHE STATES BACK AND SHOULDER PAIN FOR THE PAST WEEK.   NO CHEST PAIN  NOTED RN INFORMED PATIENT AFIB DOES CAUSE PAIN , FOLLOW UP WITH PRIMARY. PATIENT STATES SHE WILL BE SEEING DR Dubois ON Friday- SHE WILL LET HIM KNOW AT THAT TIME.

## 2015-08-05 NOTE — Telephone Encounter (Signed)
Opened accidentally 

## 2015-08-05 NOTE — Telephone Encounter (Signed)
Pt called in stating that for the past week she has been having severe pain on her back and shoulders for the past week. She is concerned because has a cardioversion scheduled for 9/8 and would like some relieve before then. Please call  Thanks

## 2015-08-07 ENCOUNTER — Ambulatory Visit
Admission: RE | Admit: 2015-08-07 | Discharge: 2015-08-07 | Disposition: A | Discharge status: Home / Self Care | Payer: PPO | Source: Ambulatory Visit | Attending: Cardiovascular Disease | Admitting: Cardiovascular Disease

## 2015-08-07 DIAGNOSIS — Z01818 Encounter for other preprocedural examination: Secondary | ICD-10-CM

## 2015-08-08 ENCOUNTER — Ambulatory Visit (INDEPENDENT_AMBULATORY_CARE_PROVIDER_SITE_OTHER): Payer: PPO | Admitting: Internal Medicine

## 2015-08-08 ENCOUNTER — Encounter: Payer: Self-pay | Admitting: Internal Medicine

## 2015-08-08 VITALS — BP 160/90 | HR 57 | Wt 132.0 lb

## 2015-08-08 DIAGNOSIS — E785 Hyperlipidemia, unspecified: Secondary | ICD-10-CM | POA: Diagnosis not present

## 2015-08-08 DIAGNOSIS — R040 Epistaxis: Secondary | ICD-10-CM | POA: Insufficient documentation

## 2015-08-08 DIAGNOSIS — R030 Elevated blood-pressure reading, without diagnosis of hypertension: Secondary | ICD-10-CM

## 2015-08-08 DIAGNOSIS — I4891 Unspecified atrial fibrillation: Secondary | ICD-10-CM | POA: Diagnosis not present

## 2015-08-08 DIAGNOSIS — IMO0001 Reserved for inherently not codable concepts without codable children: Secondary | ICD-10-CM

## 2015-08-08 LAB — CBC
HEMATOCRIT: 37.4 % (ref 36.0–46.0)
Hemoglobin: 12.6 g/dL (ref 12.0–15.0)
MCH: 31.8 pg (ref 26.0–34.0)
MCHC: 33.7 g/dL (ref 30.0–36.0)
MCV: 94.4 fL (ref 78.0–100.0)
MPV: 9.9 fL (ref 8.6–12.4)
PLATELETS: 296 10*3/uL (ref 150–400)
RBC: 3.96 MIL/uL (ref 3.87–5.11)
RDW: 13.5 % (ref 11.5–15.5)
WBC: 6.2 10*3/uL (ref 4.0–10.5)

## 2015-08-08 LAB — COMPLETE METABOLIC PANEL WITH GFR
ALBUMIN: 3.8 g/dL (ref 3.6–5.1)
ALK PHOS: 90 U/L (ref 33–130)
ALT: 14 U/L (ref 6–29)
AST: 18 U/L (ref 10–35)
BUN: 11 mg/dL (ref 7–25)
CHLORIDE: 103 mmol/L (ref 98–110)
CO2: 31 mmol/L (ref 20–31)
CREATININE: 0.68 mg/dL (ref 0.50–0.99)
Calcium: 9.3 mg/dL (ref 8.6–10.4)
GFR, Est Non African American: >89 mL/min (ref >=60)
Glucose, Bld: 78 mg/dL (ref 65–99)
Potassium: 5.1 mmol/L (ref 3.5–5.3)
Sodium: 141 mmol/L (ref 135–146)
Total Bilirubin: 0.5 mg/dL (ref 0.2–1.2)
Total Protein: 6.5 g/dL (ref 6.1–8.1)

## 2015-08-08 LAB — TSH: TSH: 1.733 u[IU]/mL (ref 0.350–4.500)

## 2015-08-08 LAB — PROTIME-INR
INR: 1.41 (ref <1.50)
PROTHROMBIN TIME: 17.4 s — AB (ref 11.6–15.2)

## 2015-08-08 LAB — APTT: aPTT: 44 seconds — ABNORMAL HIGH (ref 24–37)

## 2015-08-08 MED ORDER — RIVAROXABAN 20 MG PO TABS: 20.0000 mg | ORAL_TABLET | Freq: Every day | ORAL | Status: DC

## 2015-08-08 NOTE — Assessment & Plan Note (Signed)
8/16 x3 due to Xarelto - R nostril

## 2015-08-08 NOTE — Progress Notes (Signed)
Subjective:  Patient ID: Vicki [name], female    DOB: 1946/04/02  Age: 69 y.o. MRN: [id number]  CC: No chief complaint on file.   HPI Vicki [name] presents for A fib, allergies w/sneezing, nose bleeds  Outpatient Prescriptions Prior to Visit  Medication Sig Dispense Refill  . loratadine (CLARITIN) 10 MG tablet Take 10 mg by mouth daily.    . metoprolol tartrate (LOPRESSOR) 25 MG tablet Take 0.5 tablets (12.5 mg total) by mouth 2 (two) times daily. 90 tablet 0  . ranitidine (ZANTAC) 150 MG tablet Take 1 tablet (150 mg total) by mouth 2 (two) times daily. 180 tablet 3  . rivaroxaban (XARELTO) 20 MG TABS tablet Take 1 tablet (20 mg total) by mouth daily. 30 tablet 11  . simvastatin (ZOCOR) 20 MG tablet Take 1 tablet (20 mg total) by mouth at bedtime. 90 tablet 2  . triamcinolone ointment (KENALOG) 0.5 % Apply 1 application topically 2 (two) times daily. Apply to hand rash 60 g 3   No facility-administered medications prior to visit.    ROS Review of Systems  Constitutional: Negative for chills, activity change, appetite change, fatigue and unexpected weight change.  HENT: Positive for nosebleeds. Negative for congestion, mouth sores and sinus pressure.   Eyes: Negative for visual disturbance.  Respiratory: Negative for cough and chest tightness.   Gastrointestinal: Negative for nausea and abdominal pain.  Genitourinary: Negative for frequency, difficulty urinating and vaginal pain.  Musculoskeletal: Negative for back pain and gait problem.  Skin: Negative for pallor and rash.  Neurological: Negative for dizziness, tremors, weakness, numbness and headaches.  Psychiatric/Behavioral: Negative for confusion and sleep disturbance. The patient is not nervous/anxious.     Objective:  BP 160/90 mmHg  Pulse 57  Wt 132 lb (59.875 kg)  SpO2 98%  BP Readings from Last 3 Encounters:  08/08/15 160/90  07/14/15 132/82  07/07/15 126/74    Wt Readings from Last 3 Encounters:    08/08/15 132 lb (59.875 kg)  07/14/15 130 lb 11.2 oz (59.285 kg)  07/09/15 131 lb (59.421 kg)    Physical Exam  Constitutional: She appears well-developed. No distress.  HENT:  Head: Normocephalic.  Right Ear: External ear normal.  Left Ear: External ear normal.  Nose: Nose normal.  Mouth/Throat: Oropharynx is clear and moist.  Eyes: Conjunctivae are normal. Pupils are equal, round, and reactive to light. Right eye exhibits no discharge. Left eye exhibits no discharge.  Neck: Normal range of motion. Neck supple. No JVD present. No tracheal deviation present. No thyromegaly present.  Cardiovascular: Normal rate, regular rhythm and normal heart sounds.   Pulmonary/Chest: No stridor. No respiratory distress. She has no wheezes.  Abdominal: Soft. Bowel sounds are normal. She exhibits no distension and no mass. There is no tenderness. There is no rebound and no guarding.  Musculoskeletal: She exhibits no edema or tenderness.  Lymphadenopathy:    She has no cervical adenopathy.  Neurological: She displays normal reflexes. No cranial nerve deficit. She exhibits normal muscle tone. Coordination normal.  Skin: No rash noted. No erythema.  Psychiatric: She has a normal mood and affect. Her behavior is normal. Judgment and thought content normal.    Lab Results  Component Value Date   WBC 6.2 08/07/2015   HGB 12.6 08/07/2015   HCT 37.4 08/07/2015   PLT 296 08/07/2015   GLUCOSE 78 08/07/2015   CHOL 167 07/04/2015   TRIG 117.0 07/04/2015   HDL 65.50 07/04/2015   LDLCALC 78 07/04/2015  ALT 14 08/07/2015   AST 18 08/07/2015   NA 141 08/07/2015   K 5.1 08/07/2015   CL 103 08/07/2015   CREATININE 0.68 08/07/2015   BUN 11 08/07/2015   CO2 31 08/07/2015   TSH 1.733 08/07/2015   INR 1.41 08/07/2015   EKG S brady  Dg Chest 2 View  08/07/2015   CLINICAL DATA:  Preoperative exam prior to cardioversion, history of shortness of breath, aortic insufficiency, and breast malignancy  EXAM:  CHEST  2 VIEW  COMPARISON:  PA and lateral chest x-ray of February 11, 2012  FINDINGS: The lungs are well-expanded. On the lateral radiograph in the lower retrosternal region there is subtle nodular density. This cannot be triangulated on the frontal image. The heart and pulmonary vascularity are normal. The mediastinum is normal in width. There is no pleural effusion. The bony thorax exhibits no acute abnormality.  IMPRESSION: 1. Subtle increased density in the lower retro sternal region on the lateral image only. A repeat lateral film is recommended. If the finding persists, chest CT scanning would be a useful next imaging step. 2. There is no CHF nor other acute cardiopulmonary abnormality.   Electronically Signed   By: David  Martinique M.D.   On: 08/07/2015 10:53    Assessment & Plan:   There are no diagnoses linked to this encounter. I am having Ms. Upshur maintain her loratadine, metoprolol tartrate, simvastatin, ranitidine, triamcinolone ointment, and rivaroxaban.  No orders of the defined types were placed in this encounter.     Follow-up: No Follow-up on file.  Walker Kehr, MD

## 2015-08-08 NOTE — Assessment & Plan Note (Signed)
On Zocor 

## 2015-08-08 NOTE — Assessment & Plan Note (Signed)
On Toprol Check BP at home

## 2015-08-08 NOTE — Assessment & Plan Note (Signed)
Pt is in S brady now Will communicate to Dr Gwenlyn Found

## 2015-08-08 NOTE — Progress Notes (Signed)
Pre visit review using our clinic review tool, if applicable. No additional management support is needed unless otherwise documented below in the visit note. 

## 2015-08-14 ENCOUNTER — Encounter (HOSPITAL_COMMUNITY)
Admission: RE | Disposition: A | Discharge status: Home / Self Care | Payer: Self-pay | Source: Ambulatory Visit | Attending: Cardiovascular Disease

## 2015-08-14 ENCOUNTER — Ambulatory Visit (HOSPITAL_COMMUNITY): Payer: PPO | Admitting: Certified Registered Nurse Anesthetist

## 2015-08-14 ENCOUNTER — Telehealth: Payer: Self-pay | Admitting: *Deleted

## 2015-08-14 ENCOUNTER — Telehealth: Payer: Self-pay | Admitting: Cardiovascular Disease

## 2015-08-14 ENCOUNTER — Ambulatory Visit (HOSPITAL_COMMUNITY)
Admission: RE | Admit: 2015-08-14 | Discharge: 2015-08-14 | Disposition: A | Discharge status: Home / Self Care | Payer: PPO | Source: Ambulatory Visit | Attending: Cardiovascular Disease | Admitting: Cardiovascular Disease

## 2015-08-14 ENCOUNTER — Encounter (HOSPITAL_COMMUNITY): Payer: Self-pay | Admitting: *Deleted

## 2015-08-14 DIAGNOSIS — Z5309 Procedure and treatment not carried out because of other contraindication: Secondary | ICD-10-CM | POA: Diagnosis not present

## 2015-08-14 DIAGNOSIS — Z87891 Personal history of nicotine dependence: Secondary | ICD-10-CM | POA: Insufficient documentation

## 2015-08-14 DIAGNOSIS — K219 Gastro-esophageal reflux disease without esophagitis: Secondary | ICD-10-CM | POA: Diagnosis not present

## 2015-08-14 DIAGNOSIS — R9389 Abnormal findings on diagnostic imaging of other specified body structures: Secondary | ICD-10-CM

## 2015-08-14 DIAGNOSIS — Z01810 Encounter for preprocedural cardiovascular examination: Secondary | ICD-10-CM

## 2015-08-14 DIAGNOSIS — M1990 Unspecified osteoarthritis, unspecified site: Secondary | ICD-10-CM | POA: Insufficient documentation

## 2015-08-14 DIAGNOSIS — I4891 Unspecified atrial fibrillation: Secondary | ICD-10-CM | POA: Diagnosis not present

## 2015-08-14 SURGERY — CANCELLED PROCEDURE

## 2015-08-14 MED ORDER — SODIUM CHLORIDE 0.9 % IV SOLN
250.0000 mL | INTRAVENOUS | Status: DC
  Administered 2015-08-14: 500 mL via INTRAVENOUS

## 2015-08-14 NOTE — Telephone Encounter (Signed)
-----   Message from Lorretta Harp, MD sent at 08/10/2015  6:59 PM EDT ----- NAD except for a subtle finding on lat CXR. Radiology recommends a repeat lat CXR

## 2015-08-14 NOTE — Telephone Encounter (Signed)
Pt is returning your call

## 2015-08-14 NOTE — Progress Notes (Signed)
Case cancelled in Preop per Cardiologist.  Collins Scotland, MD

## 2015-08-14 NOTE — Telephone Encounter (Signed)
Spoke with patient. She verbalized understanding of the chest xray results and will have the lateral view repeated.

## 2015-08-14 NOTE — Progress Notes (Signed)
Vicki [name] comes in today for elective outpatient DC cardioversion. She was found to be in sinus bradycardia on 9/2 when she was visiting her primary care physician suggesting that she has paroxysmal A. Fib which would make cardioversion today not applicable. We will send her home and arrange for her to be seen in the atrial fibrillation by Roderic Palau and possibly Dr. Rayann Heman. I will see her back in follow-up after that.   Lorretta Harp, M.D., [place], [facility], [place]ver[place] [address] [place], [place]  [address]  [phone] 08/14/2015 11:52 AM

## 2015-08-14 NOTE — Telephone Encounter (Signed)
Order placed for repeat lateral view chest xray

## 2015-08-14 NOTE — Anesthesia Preprocedure Evaluation (Addendum)
Anesthesia Evaluation  Patient identified by MRN, date of birth, ID band Patient awake    Reviewed: Allergy & Precautions, NPO status , Patient's Chart, lab work & pertinent test results  History of Anesthesia Complications Negative for: history of anesthetic complications  Airway Mallampati: II  TM Distance: <3 FB Neck ROM: Full    Dental  (+) Teeth Intact, Dental Advisory Given, Implants   Pulmonary former smoker,    Pulmonary exam normal breath sounds clear to auscultation       Cardiovascular Exercise Tolerance: Poor Normal cardiovascular exam+ dysrhythmias Atrial Fibrillation + Valvular Problems/Murmurs AI  Rhythm:Irregular Rate:Normal     Neuro/Psych PSYCHIATRIC DISORDERS Anxiety negative neurological ROS     GI/Hepatic Neg liver ROS, GERD  Medicated and Controlled,  Endo/Other  negative endocrine ROS  Renal/GU negative Renal ROS     Musculoskeletal negative musculoskeletal ROS (+) Arthritis , Osteoarthritis,    Abdominal   Peds  Hematology negative hematology ROS (+)   Anesthesia Other Findings Day of surgery medications reviewed with the patient.  Reproductive/Obstetrics                           Anesthesia Physical Anesthesia Plan  ASA: III  Anesthesia Plan: MAC   Post-op Pain Management:    Induction: Intravenous  Airway Management Planned: Nasal Cannula  Additional Equipment:   Intra-op Plan:   Post-operative Plan:   Informed Consent: I have reviewed the patients History and Physical, chart, labs and discussed the procedure including the risks, benefits and alternatives for the proposed anesthesia with the patient or authorized representative who has indicated his/her understanding and acceptance.   Dental advisory given  Plan Discussed with: CRNA and Anesthesiologist  Anesthesia Plan Comments: (Discussed risks/benefits/alternatives to MAC sedation including need  for ventilatory support, hypotension, need for conversion to general anesthesia.  All patient questions answered.  Patient wished to proceed.)        Anesthesia Quick Evaluation

## 2015-08-15 ENCOUNTER — Ambulatory Visit
Admission: RE | Admit: 2015-08-15 | Discharge: 2015-08-15 | Disposition: A | Discharge status: Home / Self Care | Payer: PPO | Source: Ambulatory Visit | Attending: Cardiovascular Disease | Admitting: Cardiovascular Disease

## 2015-08-15 DIAGNOSIS — R9389 Abnormal findings on diagnostic imaging of other specified body structures: Secondary | ICD-10-CM

## 2015-08-16 ENCOUNTER — Encounter: Payer: Self-pay | Admitting: Cardiovascular Disease

## 2015-08-18 ENCOUNTER — Telehealth: Payer: Self-pay | Admitting: Cardiovascular Disease

## 2015-08-18 ENCOUNTER — Other Ambulatory Visit: Payer: Self-pay | Admitting: Cardiovascular Disease

## 2015-08-18 DIAGNOSIS — I4891 Unspecified atrial fibrillation: Secondary | ICD-10-CM

## 2015-08-18 DIAGNOSIS — R9389 Abnormal findings on diagnostic imaging of other specified body structures: Secondary | ICD-10-CM

## 2015-08-18 NOTE — Telephone Encounter (Signed)
Pt called in wanting to get the results to her chest x-ray that was done on 9/9. Please f/u with the pt  Thanks

## 2015-08-18 NOTE — Telephone Encounter (Signed)
CXR results discussed w/ patient. Noted advisement for chest CT for further eval. Pt voiced understanding.  She is also inquiring about referral to Dr. Rayann Heman - has not received call yet for this.  Will route to [place].

## 2015-08-18 NOTE — Telephone Encounter (Signed)
Order placed for chest CT with contrast (i called and confirmed with [facility] that contrast is needed) appt for CT 08/21/15 at 8:40 at [address]  Patient notified of results referral placed for Dr Rayann Heman

## 2015-08-19 ENCOUNTER — Ambulatory Visit (HOSPITAL_COMMUNITY)
Admission: RE | Admit: 2015-08-19 | Discharge: 2015-08-19 | Disposition: A | Discharge status: Home / Self Care | Payer: PPO | Source: Ambulatory Visit | Attending: Nurse Practitioner | Admitting: Nurse Practitioner

## 2015-08-19 ENCOUNTER — Telehealth: Payer: Self-pay | Admitting: Cardiovascular Disease

## 2015-08-19 VITALS — BP 138/76 | HR 86 | Ht 63.0 in | Wt 128.2 lb

## 2015-08-19 DIAGNOSIS — I998 Other disorder of circulatory system: Secondary | ICD-10-CM | POA: Insufficient documentation

## 2015-08-19 DIAGNOSIS — R918 Other nonspecific abnormal finding of lung field: Secondary | ICD-10-CM | POA: Diagnosis not present

## 2015-08-19 DIAGNOSIS — I481 Persistent atrial fibrillation: Secondary | ICD-10-CM | POA: Insufficient documentation

## 2015-08-19 DIAGNOSIS — I4819 Other persistent atrial fibrillation: Secondary | ICD-10-CM

## 2015-08-19 MED ORDER — METOPROLOL TARTRATE 25 MG PO TABS: 25.0000 mg | ORAL_TABLET | Freq: Two times a day (BID) | ORAL | Status: DC

## 2015-08-19 NOTE — Telephone Encounter (Signed)
I called patient to give her information regarding appointment with Dr. Curt Bears at our [facility] location---Monday 09/01/15 @ 11:30 am---arrive at 11:15 for check in.  Patient was also given his phone number [phone].   Patient wants to speak with Cristal Generous states she had a miserable night and wants her to know exactly what is going on.

## 2015-08-19 NOTE — Telephone Encounter (Signed)
I spoke with patient.  She says that she feels horrible.  Feels that her heart is pounding and that there is fluid in her chest.  Her symptoms worsen with laying down and she was up most of the night.  She does not know what her BP is or her heart rate.  She will be worked into the AFib clinic today. Patient aware and appreciative.

## 2015-08-20 ENCOUNTER — Encounter (HOSPITAL_COMMUNITY): Payer: Self-pay | Admitting: Nurse Practitioner

## 2015-08-20 NOTE — Progress Notes (Signed)
Patient ID: Vicki [name], female   DOB: [birth year], 69 y.o.   MRN: [id number]     Primary Care Physician: Vicki Kehr, MD Referring Physician: Dr. Sarita Haver Michon [name] is a 69 y.o. female with a h/o newlt diagnosed afib, Pt of Dr. Kennon [name], for evaluation in the [facility] clinic. She was recently diagnosed with afib and was set up for DCCV but was not in afib at the time of DCCV so procedure was cancelled. She is on low dose metoprolol for rate management and xarelto. She has been in persistent afib since last Thursday and is feeling more symptomatic with exertional dyspnea, fatigue and insomina last night due to irregular heartbeat. She is s/p L breast cancer and a recent cxr did show some persistent retrosternal nodules and she is pending a chest CT on Thursday. She received radiation to Left breast but no chemo. Her husband feels that this is playing on her nerves being concerned re return of cancer. She said she felt like she had fluid all over her left lung last night, although cxr was clear on the left but did show a small rt pleural effusion. She did not have orthopnea or PND. She is breathing comfortably today and lungs are clear. Afib is rate controlled in the office today but will increase BB to full tab q 12 hours. She is doing well with DOAC, no bleeding issues.Recent echo, normal function, mild AI, normal left atrial size. Stress test did show mild low anteroapical ischemia but Dr. [name] thought clinically she was asymptomatic and LHC not needed at this time.  No alcohol use, minimal caffeine use, no snoring history. No regular exercise, but is not obese.  Today, she denies symptoms of palpitations, chest pain, shortness of breath, orthopnea, PND, lower extremity edema, dizziness, presyncope, syncope, or neurologic sequela. The patient is tolerating medications without difficulties and is otherwise without complaint today.   Past Medical History  Diagnosis Date  . Allergic rhinitis    . Aortic insufficiency   . Paroxysmal atrial fibrillation     A. fib is now persistent.  . Hyperlipidemia   . GERD (gastroesophageal reflux disease)   . Diverticulosis of colon   . Hx of colonic polyps   . Adenocarcinoma, breast   . DJD (degenerative joint disease)   . Osteopenia   . Anxiety   . Contact dermatitis    Past Surgical History  Procedure Laterality Date  . Abdominal hysterectomy  1984    1 ovary  . Left breast lumpectomy  08/2007    node bx  . Abdominal hysterectomy      Current Outpatient Prescriptions  Medication Sig Dispense Refill  . cholecalciferol (VITAMIN D) 400 UNITS TABS tablet Take 400 Units by mouth 2 (two) times daily.    Marland Kitchen loratadine (CLARITIN) 10 MG tablet Take 10 mg by mouth daily.    . metoprolol tartrate (LOPRESSOR) 25 MG tablet Take 1 tablet (25 mg total) by mouth 2 (two) times daily. 90 tablet 0  . ranitidine (ZANTAC) 150 MG tablet Take 1 tablet (150 mg total) by mouth 2 (two) times daily. 180 tablet 3  . rivaroxaban (XARELTO) 20 MG TABS tablet Take 1 tablet (20 mg total) by mouth daily. 90 tablet 3  . simvastatin (ZOCOR) 20 MG tablet Take 1 tablet (20 mg total) by mouth at bedtime. 90 tablet 2  . triamcinolone ointment (KENALOG) 0.5 % Apply 1 application topically 2 (two) times daily. Apply to hand rash (Patient not taking:  Reported on 08/19/2015) 60 g 3   No current facility-administered medications for this encounter.    Allergies  Allergen Reactions  . Codeine     REACTION: unknown    Social History   Social History  . Marital Status: Married    Spouse Name: N/A  . Number of Children: 1  . Years of Education: N/A   Occupational History  . [employer]   Social History Main Topics  . Smoking status: Former Smoker    Types: Cigarettes    Quit date: 12/06/1976  . Smokeless tobacco: Not on file  . Alcohol Use: No  . Drug Use: No  . Sexual Activity: Not on file   Other Topics Concern  . Not on file   Social  History Narrative    Family History  Problem Relation Age of Onset  . Heart disease Mother 31    AVR    ROS- All systems are reviewed and negative except as per the HPI above  Physical Exam: Filed Vitals:   08/19/15 1519  BP: 138/76  Pulse: 86  Height: 5\' 3"  (1.6 m)  Weight: 128 lb 3.2 oz (58.151 kg)    GEN- The patient is well appearing, alert and oriented x 3 today.   Head- normocephalic, atraumatic Eyes-  Sclera clear, conjunctiva pink Ears- hearing intact Oropharynx- clear Neck- supple, no JVP Lymph- no cervical lymphadenopathy Lungs- Clear to ausculation bilaterally, normal work of breathing Heart- Irregular rate and rhythm, no murmurs, rubs or gallops, PMI not laterally displaced GI- soft, NT, ND, + BS Extremities- no clubbing, cyanosis, or edema MS- no significant deformity or atrophy Skin- no rash or lesion Psych- euthymic mood, full affect Neuro- strength and sensation are intact  EKG- afib at 85 bpm, qrs 76 ms, QTc 435 ms. Echo/stress myoview- see HPI  Assessment and Plan:  1. Persistent afib For now continue metoprolol at 25 mg bid instead of 12.5 mg bid.  Continue xarelto AAD options discussed with pt   2. Abnormal CXR Pending chest CT on Thursday Probably should see result of this before cardioversion, AAD therapy is decided upon  3. Mild ischemia on myoview Clinically no chest pain and LHC is deemed necessary at this time per Dr. [name] Would avoid 1c agents   F/u with Dr. [name] as scheduled 9/26 for further option to restore SR. Afib clinic as needed  Vicki [name], [facility] [address] [place], [place] [address] [phone]

## 2015-08-21 ENCOUNTER — Ambulatory Visit
Admission: RE | Admit: 2015-08-21 | Discharge: 2015-08-21 | Disposition: A | Discharge status: Home / Self Care | Payer: PPO | Source: Ambulatory Visit | Attending: Cardiovascular Disease | Admitting: Cardiovascular Disease

## 2015-08-21 DIAGNOSIS — R9389 Abnormal findings on diagnostic imaging of other specified body structures: Secondary | ICD-10-CM

## 2015-08-21 MED ORDER — IOPAMIDOL (ISOVUE-300) INJECTION 61%
75.0000 mL | Freq: Once | INTRAVENOUS | Status: AC | PRN
  Administered 2015-08-21: 75 mL via INTRAVENOUS

## 2015-08-24 ENCOUNTER — Other Ambulatory Visit: Payer: Self-pay | Admitting: Internal Medicine

## 2015-08-27 ENCOUNTER — Encounter: Payer: Self-pay | Admitting: *Deleted

## 2015-09-01 ENCOUNTER — Ambulatory Visit (INDEPENDENT_AMBULATORY_CARE_PROVIDER_SITE_OTHER): Payer: PPO | Admitting: Cardiology

## 2015-09-01 ENCOUNTER — Encounter: Payer: Self-pay | Admitting: Cardiology

## 2015-09-01 VITALS — BP 126/82 | HR 70 | Ht 63.0 in | Wt 130.2 lb

## 2015-09-01 DIAGNOSIS — I4819 Other persistent atrial fibrillation: Secondary | ICD-10-CM

## 2015-09-01 DIAGNOSIS — I481 Persistent atrial fibrillation: Secondary | ICD-10-CM | POA: Diagnosis not present

## 2015-09-01 NOTE — Patient Instructions (Signed)
Medication Instructions:  Your physician recommends that you continue on your current medications as directed. Please refer to the Current Medication list given to you today.  Labwork: None ordered  Testing/Procedures: None ordered  Follow-Up: Your physician recommends that you schedule an appointment with Elberta Leatherwood, pharmacist, for Tikosyn loading.  Your physician recommends that you schedule a follow-up appointment with Roderic Palau, NP in our AFib clinic once Tikosyn has been started.  Any Other Special Instructions Will Be Listed Below (If Applicable). Thank you for choosing [facility]!!   Trinidad Curet, RN [phone]

## 2015-09-01 NOTE — Progress Notes (Signed)
Electrophysiology Office Note   Date:  09/01/2015   ID:  Lousie, Calico [birth year], MRN [id number]  PCP:  Walker Kehr, MD  Cardiologist:  Quay Burow, MD Primary Electrophysiologist: Constance Haw, MD    No chief complaint on file.    History of Present Illness: Vicki [name] is a 69 y.o. female who presents today for electrophysiology evaluation.  She has a history of atrial fibrillation which has become persistent, hyperlipidemia, and aortic insufficiency.  She has never had an MI or stroke.  She had atrial fibrillation which started 5+ years ago and was treated with beta blocker at that time.  She was noted to have AF by her PCP who put her on Xarelto for a CHADS2VA2Sc of 2.  She presented to the AF clinic feeling poorly and metoprolol was increased.    Today, she denies symptoms of palpitations, chest pain, lower extremity edema, claudication, dizziness, presyncope, syncope, bleeding, or neurologic sequela. The patient is tolerating medications without difficulties.  She has been having fatigue, as well as shortness of breath since being back in AF.     Past Medical History  Diagnosis Date  . Allergic rhinitis   . Aortic insufficiency   . Paroxysmal atrial fibrillation     A. fib is now persistent.  . Hyperlipidemia   . GERD (gastroesophageal reflux disease)   . Diverticulosis of colon   . Hx of colonic polyps   . Adenocarcinoma, breast   . DJD (degenerative joint disease)   . Osteopenia   . Anxiety   . Contact dermatitis    Past Surgical History  Procedure Laterality Date  . Abdominal hysterectomy  1984    with 1 ovary removal  . Breast lumpectomy Left 08/2007     with node bx     Current Outpatient Prescriptions  Medication Sig Dispense Refill  . cholecalciferol (VITAMIN D) 400 UNITS TABS tablet Take 400 Units by mouth 2 (two) times daily.    Marland Kitchen loratadine (CLARITIN) 10 MG tablet Take 10 mg by mouth daily.    . metoprolol tartrate  (LOPRESSOR) 25 MG tablet Take 1 tablet (25 mg total) by mouth 2 (two) times daily. 90 tablet 0  . metoprolol tartrate (LOPRESSOR) 25 MG tablet TAKE ONE-HALF TABLET BY MOUTH TWICE DAILY 90 tablet 1  . ranitidine (ZANTAC) 150 MG tablet Take 1 tablet (150 mg total) by mouth 2 (two) times daily. 180 tablet 3  . rivaroxaban (XARELTO) 20 MG TABS tablet Take 1 tablet (20 mg total) by mouth daily. 90 tablet 3  . simvastatin (ZOCOR) 20 MG tablet Take 1 tablet (20 mg total) by mouth at bedtime. 90 tablet 2  . triamcinolone ointment (KENALOG) 0.5 % Apply 1 application topically 2 (two) times daily. Apply to hand rash (Patient not taking: Reported on 08/19/2015) 60 g 3   No current facility-administered medications for this visit.    Allergies:   Codeine   Social History:  The patient  reports that she quit smoking about 38 years ago. Her smoking use included Cigarettes. She has never used smokeless tobacco. She reports that she does not drink alcohol or use illicit drugs.   Family History:  The patient's family history includes Clotting disorder (age of onset: 78) in her mother; Healthy in her brother; Heart attack in her father; Heart disease in her mother; Heart disease (age of onset: 72) in her father; Hypertension in her sister; Lymphoma (age of onset: 71) in her sister; Other (age of  onset: 72) in her mother.    ROS:  Please see the history of present illness.   Otherwise, review of systems is positive for DOE, orthopnea, PND.   All other systems are reviewed and negative.    PHYSICAL EXAM: VS:  There were no vitals taken for this visit. , BMI There is no weight on file to calculate BMI. GEN: Well nourished, well developed, in no acute distress HEENT: normal Neck: no JVD, carotid bruits, or masses Cardiac: irregular rhythm; no murmurs, rubs, or gallops,no edema  Respiratory:  clear to auscultation bilaterally, normal work of breathing GI: soft, nontender, nondistended, + BS MS: no deformity or  atrophy Skin: warm and dry Neuro:  Strength and sensation are intact Psych: euthymic mood, full affect  EKG:  EKG is ordered today. The ekg ordered today shows atrial fibrillation, rate 70  Recent Labs: 08/07/2015: ALT 14; BUN 11; Creat 0.68; Hemoglobin 12.6; Platelets 296; Potassium 5.1; Sodium 141; TSH 1.733    Lipid Panel     Component Value Date/Time   CHOL 167 07/04/2015 0956   TRIG 117.0 07/04/2015 0956   HDL 65.50 07/04/2015 0956   CHOLHDL 3 07/04/2015 0956   VLDL 23.4 07/04/2015 0956   LDLCALC 78 07/04/2015 0956     Wt Readings from Last 3 Encounters:  08/19/15 128 lb 3.2 oz (58.151 kg)  08/08/15 132 lb (59.875 kg)  07/14/15 130 lb 11.2 oz (59.285 kg)      Other studies Reviewed: Additional studies/ records that were reviewed today include: TTE 07/09/15  Review of the above records today demonstrates:  - Left ventricle: The cavity size was normal. Wall thickness was normal. Systolic function was normal. The estimated ejection fraction was in the range of 55% to 60%. Wall motion was normal; there were no regional wall motion abnormalities. Doppler parameters are consistent with high ventricular filling pressure. - Aortic valve: There was mild regurgitation. - Mitral valve: Calcified annulus. Mildly thickened leaflets . Mild prolapse, involving the anterior leaflet. There was mild regurgitation. - Pulmonary arteries: Systolic pressure was mildly increased. - Pericardium, extracardiac: A trivial pericardial effusion was identified.  Myocardial perfusion imaging 07/09/15 Myocardial perfusion is abnormal. Findings consistent with ischemia. This is a low risk study. Overall left ventricular systolic function was normal. LV cavity size is normal. Nuclear stress EF: 73%. The left ventricular ejection fraction is hyperdynamic (>65%). There is no prior study for comparison.  ASSESSMENT AND PLAN:  1.  PAROXYSMAL ATRIAL FIBRILLATION: diagnosed 5 years ago and  found again by her PCP who put her on Xarelto for a CHADS2VA2SC of 2.  She also is on metoprolol.  Presented to AF clinic with symptomatic AF which was rate controlled.  Metoprolol was increased at that time but has continued to have symptoms.  Due to her symptoms, Tylan Briguglio choose a rhythm control strategy.  She has possible CAD on her nuclear stress test, so Ziza Hastings load her on Tikosyn.  Nasiyah Laverdiere arrange a time for her to come into the hospital.  Should she not remain in sinus on Tikosyn, Angeliyah Kirkey plan for ablation.   Current medicines are reviewed at length with the patient today.   The patient does not have concerns regarding her medicines.  The following changes were made today:  none  Labs/ tests ordered today include:  No orders of the defined types were placed in this encounter.     Disposition:   FU with AF clinic post Tikosyn load  Signed, Kaylan Yates Meredith Leeds, MD  09/01/2015  11:32 AM     [facility] [address] [place] [address] [phone] (office) [phone] (fax)

## 2015-09-03 ENCOUNTER — Ambulatory Visit (INDEPENDENT_AMBULATORY_CARE_PROVIDER_SITE_OTHER): Payer: PPO | Admitting: Pharmacist

## 2015-09-03 ENCOUNTER — Encounter (HOSPITAL_COMMUNITY): Payer: Self-pay | Admitting: General Practice

## 2015-09-03 ENCOUNTER — Inpatient Hospital Stay (HOSPITAL_COMMUNITY)
Admission: AD | Admit: 2015-09-03 | Discharge: 2015-09-06 | DRG: 310 | Disposition: A | Discharge status: Home / Self Care | Payer: PPO | Source: Ambulatory Visit | Attending: Internal Medicine | Admitting: Internal Medicine

## 2015-09-03 DIAGNOSIS — Z7901 Long term (current) use of anticoagulants: Secondary | ICD-10-CM

## 2015-09-03 DIAGNOSIS — Z5181 Encounter for therapeutic drug level monitoring: Secondary | ICD-10-CM

## 2015-09-03 DIAGNOSIS — E785 Hyperlipidemia, unspecified: Secondary | ICD-10-CM | POA: Diagnosis present

## 2015-09-03 DIAGNOSIS — Z87891 Personal history of nicotine dependence: Secondary | ICD-10-CM

## 2015-09-03 DIAGNOSIS — M1990 Unspecified osteoarthritis, unspecified site: Secondary | ICD-10-CM | POA: Diagnosis present

## 2015-09-03 DIAGNOSIS — F419 Anxiety disorder, unspecified: Secondary | ICD-10-CM | POA: Diagnosis present

## 2015-09-03 DIAGNOSIS — I481 Persistent atrial fibrillation: Secondary | ICD-10-CM | POA: Diagnosis not present

## 2015-09-03 DIAGNOSIS — Z79899 Other long term (current) drug therapy: Secondary | ICD-10-CM

## 2015-09-03 DIAGNOSIS — I351 Nonrheumatic aortic (valve) insufficiency: Secondary | ICD-10-CM | POA: Diagnosis present

## 2015-09-03 DIAGNOSIS — Z853 Personal history of malignant neoplasm of breast: Secondary | ICD-10-CM

## 2015-09-03 DIAGNOSIS — I4819 Other persistent atrial fibrillation: Secondary | ICD-10-CM

## 2015-09-03 DIAGNOSIS — I4891 Unspecified atrial fibrillation: Secondary | ICD-10-CM | POA: Diagnosis present

## 2015-09-03 DIAGNOSIS — K219 Gastro-esophageal reflux disease without esophagitis: Secondary | ICD-10-CM | POA: Diagnosis present

## 2015-09-03 HISTORY — DX: Other long term (current) drug therapy: Z79.899

## 2015-09-03 HISTORY — DX: Encounter for therapeutic drug level monitoring: Z51.81

## 2015-09-03 LAB — BASIC METABOLIC PANEL
BUN: 15 mg/dL (ref 6–23)
CO2: 29 meq/L (ref 19–32)
Calcium: 9.4 mg/dL (ref 8.4–10.5)
Chloride: 103 mEq/L (ref 96–112)
Creatinine, Ser: 0.67 mg/dL (ref 0.40–1.20)
GFR: 92.76 mL/min (ref >60.00)
GLUCOSE: 118 mg/dL — AB (ref 70–99)
POTASSIUM: 4.2 meq/L (ref 3.5–5.1)
SODIUM: 138 meq/L (ref 135–145)

## 2015-09-03 LAB — MAGNESIUM: Magnesium: 2.1 mg/dL (ref 1.5–2.5)

## 2015-09-03 MED ORDER — SODIUM CHLORIDE 0.9 % IJ SOLN
3.0000 mL | INTRAMUSCULAR | Status: DC | PRN
  Administered 2015-09-05: 3 mL via INTRAVENOUS
  Filled 2015-09-03: qty 3

## 2015-09-03 MED ORDER — LORATADINE 10 MG PO TABS
10.0000 mg | ORAL_TABLET | Freq: Every day | ORAL | Status: DC
  Administered 2015-09-04 – 2015-09-06 (×3): 10 mg via ORAL
  Filled 2015-09-03 (×3): qty 1

## 2015-09-03 MED ORDER — METOPROLOL TARTRATE 25 MG PO TABS
25.0000 mg | ORAL_TABLET | Freq: Two times a day (BID) | ORAL | Status: DC
  Administered 2015-09-03 – 2015-09-04 (×2): 25 mg via ORAL
  Filled 2015-09-03 (×3): qty 1

## 2015-09-03 MED ORDER — SIMVASTATIN 20 MG PO TABS
20.0000 mg | ORAL_TABLET | Freq: Every day | ORAL | Status: DC
  Administered 2015-09-03 – 2015-09-05 (×3): 20 mg via ORAL
  Filled 2015-09-03 (×3): qty 1

## 2015-09-03 MED ORDER — DOFETILIDE 500 MCG PO CAPS
500.0000 ug | ORAL_CAPSULE | Freq: Two times a day (BID) | ORAL | Status: DC
  Administered 2015-09-03 – 2015-09-06 (×6): 500 ug via ORAL
  Filled 2015-09-03 (×6): qty 1

## 2015-09-03 MED ORDER — SODIUM CHLORIDE 0.9 % IV SOLN: 250.0000 mL | INTRAVENOUS | Status: DC | PRN

## 2015-09-03 MED ORDER — ZOLPIDEM TARTRATE 5 MG PO TABS
5.0000 mg | ORAL_TABLET | Freq: Once | ORAL | Status: DC
  Filled 2015-09-03: qty 1

## 2015-09-03 MED ORDER — SODIUM CHLORIDE 0.9 % IJ SOLN
3.0000 mL | Freq: Two times a day (BID) | INTRAMUSCULAR | Status: DC
  Administered 2015-09-03 – 2015-09-06 (×5): 3 mL via INTRAVENOUS

## 2015-09-03 MED ORDER — RIVAROXABAN 20 MG PO TABS
20.0000 mg | ORAL_TABLET | Freq: Every day | ORAL | Status: DC
  Administered 2015-09-03 – 2015-09-05 (×3): 20 mg via ORAL
  Filled 2015-09-03 (×3): qty 1

## 2015-09-03 MED ORDER — VITAMIN D 1000 UNITS PO TABS
1000.0000 [IU] | ORAL_TABLET | Freq: Two times a day (BID) | ORAL | Status: DC
  Administered 2015-09-03 – 2015-09-06 (×6): 1000 [IU] via ORAL
  Filled 2015-09-03 (×6): qty 1

## 2015-09-03 MED ORDER — FAMOTIDINE 20 MG PO TABS
20.0000 mg | ORAL_TABLET | Freq: Every day | ORAL | Status: DC
  Administered 2015-09-04 – 2015-09-06 (×3): 20 mg via ORAL
  Filled 2015-09-03 (×3): qty 1

## 2015-09-03 NOTE — Progress Notes (Signed)
Pharmacy Review for Dofetilide (Tikosyn) Initiation  Admit Complaint: 69 y.o. female admitted 09/03/2015 with atrial fibrillation to be initiated on dofetilide.   Assessment:  Patient Exclusion Criteria: If any screening criteria checked as "Yes", then  patient  should NOT receive dofetilide until criteria item is corrected. If "Yes" please indicate correction plan.  YES  NO Patient  Exclusion Criteria Correction Plan  []  [x]  Baseline QTc interval is greater than or equal to 440 msec. IF above YES box checked dofetilide contraindicated unless patient has ICD; then may proceed if QTc 500-550 msec or with known ventricular conduction abnormalities may proceed with QTc 550-600 msec. QTc = 419   []  [x]  Magnesium level is less than 1.8 mEq/l : Last magnesium:  Lab Results  Component Value Date   MG 2.1 09/03/2015         []  [x]  Potassium level is less than 4 mEq/l : Last potassium:  Lab Results  Component Value Date   K 4.2 09/03/2015         []  [x]  Patient is known or suspected to have a digoxin level greater than 2 ng/ml: No results found for: DIGOXIN    []  [x]  Creatinine clearance less than 20 ml/min (calculated using Cockcroft-Gault, actual body weight and serum creatinine): ~65 mL/min   []  [x]  Patient has received drugs known to prolong the QT intervals within the last 48 hours (phenothiazines, tricyclics or tetracyclic antidepressants, erythromycin, H-1 antihistamines, cisapride, fluoroquinolones, azithromycin). Drugs not listed above may have an, as yet, undetected potential to prolong the QT interval, updated information on QT prolonging agents is available at this website:QT prolonging agents   []  [x]  Patient received a dose of hydrochlorothiazide (Oretic) alone or in any combination including triamterene (Dyazide, Maxzide) in the last 48 hours.   []  [x]  Patient received a medication known to increase dofetilide plasma concentrations prior to initial dofetilide dose:   . Trimethoprim (Primsol, Proloprim) in the last 36 hours . Verapamil (Calan, Verelan) in the last 36 hours or a sustained release dose in the last 72 hours . Megestrol (Megace) in the last 5 days  . Cimetidine (Tagamet) in the last 6 hours . Ketoconazole (Nizoral) in the last 24 hours . Itraconazole (Sporanox) in the last 48 hours  . Prochlorperazine (Compazine) in the last 36 hours    []  [x]  Patient is known to have a history of torsades de pointes; congenital or acquired long QT syndromes.   []  [x]  Patient has received a Class 1 antiarrhythmic with less than 2 half-lives since last dose. (Disopyramide, Quinidine, Procainamide, Lidocaine, Mexiletine, Flecainide, Propafenone)   []  [x]  Patient has received amiodarone therapy in the past 3 months or amiodarone level is greater than 0.3 ng/ml.    Patient has been appropriately anticoagulated with Xarelto.  Ordering provider was confirmed at [email or web address] if they are not listed on the [facility] Prescribers list.  Goal of Therapy: Follow renal function, electrolytes, potential drug interactions, and dose adjustment. Provide education and 1 week supply at discharge.  Plan:  [x]   Physician selected initial dose within range recommended for patients level of renal function - will monitor for response.  []   Physician selected initial dose outside of range recommended for patients level of renal function - will discuss if the dose should be altered at this time.   Select One Calculated CrCl  Dose q12h  [x]  > 60 ml/min 500 mcg  []  40-60 ml/min 250 mcg  []  20-40 ml/min 125 mcg   2.  Follow up QTc after the first 5 doses, renal function, electrolytes (K & Mg) daily x 3     days, dose adjustment, success of initiation and facilitate 1 week discharge supply as     clinically indicated.  3. Initiate Tikosyn education video (Call [phone] and ask for video # 116).  4. Place Enrollment Form on the chart for discharge supply of  dofetilide.  Andrey Cota. Diona Foley, PharmD Clinical Pharmacist Pager [phone] 4:58 PM 09/03/2015

## 2015-09-03 NOTE — H&P (Signed)
Patient ID: Aissata Wilmore MRN: [id number], DOB/AGE: 69/21/1947   Admit date: 09/03/2015   Primary Physician: Walker Kehr, MD Cardiologist: Quay Burow, MD Primary Electrophysiologist: Constance Haw, MD   Pt. Profile:  Vicki [name] is a 69 y.o. female with a history of afib, aortic insufficieny,GERD, HL  who admitted directly for Tikosyn load.   HPI: as above. She had atrial fibrillation which started 5+ years ago and was treated with beta blocker at that time. She was noted to have AF by her PCP who put her on Xarelto for a CHADS2VA2Sc of 2. She presented to the AF clinic feeling poorly and metoprolol was increased. She was set up for DCCV but was not in afib at the time of DCCV so procedure was cancelled. Seen in clinic by Dr. Curt Bears 09/01/15, at that time she continued to have palpitations. She felt good candidate for rhythm control. She has possible CAD on her nuclear stress test, so plan made to load her on Tikosyn.   Mg 2.1 and K of 4.2 yesterday (09/03/15).  No alcohol use, minimal caffeine use, no snoring history. No regular exercise, but is not obese. Last echo 07/09/15 showed Lv Ef of 55-60%, no wm abnormality, elevated LV filling pressure; mild AI; thickened MV with prolapse of the anterior leaflet; eccentric, posteriorly directed MR difficult to quantitate but appears to be mild (may be underestimated due to eccentric nature); mild TR, mildly elevated pulmonary pressures.  She denies symptoms of palpitations, chest pain, shortness of breath, orthopnea, PND, lower extremity edema, dizziness, presyncope, syncope, or neurologic sequela. The patient is tolerating medications without difficulties and is otherwise without complaint today.    Problem List  Past Medical History  Diagnosis Date  . Allergic rhinitis   . Aortic insufficiency   . Paroxysmal atrial fibrillation     A. fib is now persistent.  . Hyperlipidemia   . GERD  (gastroesophageal reflux disease)   . Diverticulosis of colon   . Hx of colonic polyps   . Adenocarcinoma, breast   . DJD (degenerative joint disease)   . Osteopenia   . Anxiety   . Contact dermatitis     Past Surgical History  Procedure Laterality Date  . Abdominal hysterectomy  1984    with 1 ovary removal  . Breast lumpectomy Left 08/2007     with node bx     Allergies  Allergies  Allergen Reactions  . Codeine     REACTION: unknown     Home Medications  Prior to Admission medications   Medication Sig Start Date End Date Taking? Authorizing Provider  cholecalciferol (VITAMIN D) 1000 UNITS tablet Take 1,000 Units by mouth 2 (two) times daily.    Historical Provider, MD  loratadine (CLARITIN) 10 MG tablet Take 10 mg by mouth daily.    Historical Provider, MD  metoprolol tartrate (LOPRESSOR) 25 MG tablet Take 1 tablet (25 mg total) by mouth 2 (two) times daily. 08/19/15   Sherran Needs, NP  ranitidine (ZANTAC) 150 MG tablet Take 1 tablet (150 mg total) by mouth 2 (two) times daily. 06/04/15   Aleksei Plotnikov V, MD  rivaroxaban (XARELTO) 20 MG TABS tablet Take 1 tablet (20 mg total) by mouth daily. 08/08/15   Aleksei Plotnikov V, MD  simvastatin (ZOCOR) 20 MG tablet Take 1 tablet (20 mg total) by mouth at bedtime. 06/04/15   Aleksei Plotnikov V, MD  triamcinolone ointment (KENALOG) 0.5 % Apply 1 application topically 2 (two) times  daily. Apply to hand rash 07/04/15   Cassandria Anger, MD    Family History  Family History  Problem Relation Age of Onset  . Other Mother 50    AVR  . Heart disease Mother   . Clotting disorder Mother 44  . Heart disease Father 39  . Heart attack Father   . Lymphoma Sister 79  . Hypertension Sister   . Healthy Brother    Family Status  Relation Status Death Age  . Father Deceased 62  . Mother Deceased 80  . Sister Deceased 37  . Sister    . Brother       Social History  Social History   Social History  . Marital Status:  Married    Spouse Name: N/A  . Number of Children: 1  . Years of Education: N/A   Occupational History  . [employer]   Social History Main Topics  . Smoking status: Former Smoker    Types: Cigarettes    Quit date: 12/06/1976  . Smokeless tobacco: Never Used  . Alcohol Use: No  . Drug Use: No  . Sexual Activity: Not on file   Other Topics Concern  . Not on file   Social History Narrative     Review of Systems General:  No chills, fever, night sweats or weight changes.  Cardiovascular:  No chest pain, dyspnea on exertion, edema, orthopnea, palpitations, paroxysmal nocturnal dyspnea. Dermatological: No rash, lesions/masses Respiratory: No cough, dyspnea Urologic: No hematuria, dysuria Abdominal:   No nausea, vomiting, diarrhea, bright red blood per rectum, melena, or hematemesis Neurologic:  No visual changes, wkns, changes in mental status. All other systems reviewed and are otherwise negative except as noted above.  Physical Exam  Blood pressure 138/74, temperature 97.9 F (36.6 C), temperature source Oral, resp. rate 15, height 5\' 3"  (1.6 m), weight 130 lb 11.2 oz (59.285 kg), SpO2 98 %.  General: Pleasant, NAD Psych: Normal affect. Neuro: Alert and oriented X 3. Moves all extremities spontaneously. HEENT: Normal  Neck: Supple without bruits or JVD. Lungs:  Resp regular and unlabored, CTA. Heart: RRR no s3, s4, or murmurs. Abdomen: Soft, non-tender, non-distended, BS + x 4.  Extremities: No clubbing, cyanosis or edema. DP/PT/Radials 2+ and equal bilaterally.  Labs  No results for input(s): CKTOTAL, CKMB, TROPONINI in the last 72 hours. Lab Results  Component Value Date   WBC 6.2 08/07/2015   HGB 12.6 08/07/2015   HCT 37.4 08/07/2015   MCV 94.4 08/07/2015   PLT 296 08/07/2015    Recent Labs Lab 09/03/15 0945  NA 138  K 4.2  CL 103  CO2 29  BUN 15  CREATININE 0.67  CALCIUM 9.4  GLUCOSE 118*   Lab Results  Component Value Date   CHOL  167 07/04/2015   HDL 65.50 07/04/2015   LDLCALC 78 07/04/2015   TRIG 117.0 07/04/2015   No results found for: DDIMER   Echo 07/09/15 LV EF: 55% -  60%  ------------------------------------------------------------------- History:  PMH: Acquired from the patient and from the patient&'s chart. Dyspnea. Atrial fibrillation. Risk factors: Dyslipidemia.  ------------------------------------------------------------------- Study Conclusions  - Left ventricle: The cavity size was normal. Wall thickness was normal. Systolic function was normal. The estimated ejection fraction was in the range of 55% to 60%. Wall motion was normal; there were no regional wall motion abnormalities. Doppler parameters are consistent with high ventricular filling pressure. - Aortic valve: There was mild regurgitation. - Mitral valve: Calcified annulus. Mildly thickened  leaflets . Mild prolapse, involving the anterior leaflet. There was mild regurgitation. - Pulmonary arteries: Systolic pressure was mildly increased. - Pericardium, extracardiac: A trivial pericardial effusion was identified.  Impressions:  - Normal LV function; elevated LV filling pressure; mild AI; thickened MV with prolapse of the anterior leaflet; eccentric, posteriorly directed MR difficult to quantitate but appears to be mild (may be underestimated due to eccentric nature); mild TR, mildly elevated pulmonary pressures.  Radiology/Studies  Dg Chest 1 View  08/15/2015   CLINICAL DATA:  Previously seen abnormality on lateral chest x-ray.  EXAM: CHEST  1 VIEW  COMPARISON:  08/07/2015  FINDINGS: Single lateral chest radiograph was presented for interpretation. Cardiac silhouette appears mildly enlarged when compared to the prior study. The previously noted nodular asymmetry in the retrosternal region persists. There is blunting of probably right costophrenic angle, which may be seen with small pleural effusion. No  evidence of pneumothorax.  Osseous structures demonstrate mild osteoarthritic changes of the thoracic spine.  IMPRESSION: Persistent retrosternal opacity. Further evaluation with chest CT is recommended.  Apparent interval enlargement of the cardiac silhouette, and developing of probable small right pleural effusion.   Electronically Signed   By: Fidela Salisbury M.D.   On: 08/15/2015 14:20   Dg Chest 2 View  08/07/2015   CLINICAL DATA:  Preoperative exam prior to cardioversion, history of shortness of breath, aortic insufficiency, and breast malignancy  EXAM: CHEST  2 VIEW  COMPARISON:  PA and lateral chest x-ray of February 11, 2012  FINDINGS: The lungs are well-expanded. On the lateral radiograph in the lower retrosternal region there is subtle nodular density. This cannot be triangulated on the frontal image. The heart and pulmonary vascularity are normal. The mediastinum is normal in width. There is no pleural effusion. The bony thorax exhibits no acute abnormality.  IMPRESSION: 1. Subtle increased density in the lower retro sternal region on the lateral image only. A repeat lateral film is recommended. If the finding persists, chest CT scanning would be a useful next imaging step. 2. There is no CHF nor other acute cardiopulmonary abnormality.   Electronically Signed   By: David  Martinique M.D.   On: 08/07/2015 10:53   Ct Chest W Contrast  08/21/2015   CLINICAL DATA:  Abnormal chest x-ray with persistent retrosternal opacity, breast cancer, atrial fibrillation, former smoker  EXAM: CT CHEST WITH CONTRAST  TECHNIQUE: Multidetector CT imaging of the chest was performed during intravenous contrast administration. Sagittal and coronal MPR images reconstructed from axial data set.  CONTRAST:  63mL ISOVUE-300 IOPAMIDOL (ISOVUE-300) INJECTION 61% IV  COMPARISON:  Chest radiograph 08/15/2015  FINDINGS: Scattered atherosclerotic calcifications aorta and coronary arteries.  Aorta normal caliber.  Minimal biatrial  enlargement.  No thoracic adenopathy.  Visualized upper abdomen unremarkable.  2 mm RIGHT upper lobe nodule posteriorly image 22, nonspecific.  Lungs otherwise clear.  No pulmonary infiltrate, pleural effusion or pneumothorax.  Small sclerotic focus in distal sternum question tiny bone island.  No additional osseous findings.  IMPRESSION: Scattered atherosclerotic calcifications.  Biatrial enlargement.  2 mm RIGHT upper lobe nodule, nonspecific; in this patient with a history of breast cancer, recommend followup CT chest in 4-6 months to reassess.  No additional intra thoracic abnormalities identified.  Specifically, no retrosternal abnormalities seen.   Electronically Signed   By: Lavonia Dana M.D.   On: 08/21/2015 09:47    ECG  EKG 09/01/15 Afib at rate of 70 QT.Qtc 378/49ms  ASSESSMENT AND PLAN  1. Atrial fibrillation  -  Mg 2.1 and K of 4.2 yesterday (09/03/15). - Admission for Tikosyn load will follow standard order set. Continue home meds.   Signed, Leanor Kail, PA-C 09/03/2015, 2:52 PM  EP Attending  Patient seen and examined. Agree with the findings as noted above.   Mikle Bosworth.D.

## 2015-09-03 NOTE — Progress Notes (Signed)
Date:  09/03/2015   ID:  Hewitt Shorts, DOB 05/01/1946, MRN [id number]  PCP:  Walker Kehr, MD  Cardiologist:  Quay Burow, MD Primary Electrophysiologist: Allegra Lai, MD  Chief Complaint  Patient presents with  . Atrial Fibrillation    Tikosyn initiation     History of Present Illness: Vicki [name] is a 69 y.o. female who presents today for Tikosyn initiation.  She has a history of atrial fibrillation which has become persistent, hyperlipidemia, and aortic insufficiency.  She has never had an MI or stroke.  She had atrial fibrillation which started 5+ years ago and was treated with beta blocker at that time.  Her PCP put her on Xarelto for a CHADS2VA2Sc of 2 in August.   Dr. Gwenlyn Found scheduled a cardioversion on 9/8 but was canceled because she was in sinus bradycardia.  At her visit with the Afib clinic on 9/13, she was back in atrial fibrillation.  She had an appt with Dr. Curt Bears on 9/26 and plans were made to try to restore SR with Tikosyn at that time.    Reviewed pt's medication list.  She is currently not taking any QTc prolongating or contraindicated medications.  She is anticoagulated with Xarelto and has not missed any doses in the past 4 weeks.  She has never tried any other AAD.  Educated patient on potential side effects of Tikosyn including QTc prolongation.  She is aware to contact the office if she misses any doses of Tikosyn.    Past Medical History  Diagnosis Date  . Allergic rhinitis   . Aortic insufficiency   . Paroxysmal atrial fibrillation     A. fib is now persistent.  . Hyperlipidemia   . GERD (gastroesophageal reflux disease)   . Diverticulosis of colon   . Hx of colonic polyps   . Adenocarcinoma, breast   . DJD (degenerative joint disease)   . Osteopenia   . Anxiety   . Contact dermatitis    Past Surgical History  Procedure Laterality Date  . Abdominal hysterectomy  1984    with 1 ovary removal  . Breast lumpectomy Left 08/2007     with node bx     Current Outpatient Prescriptions  Medication Sig Dispense Refill  . cholecalciferol (VITAMIN D) 400 UNITS TABS tablet Take 400 Units by mouth 2 (two) times daily.    Marland Kitchen loratadine (CLARITIN) 10 MG tablet Take 10 mg by mouth daily.    . metoprolol tartrate (LOPRESSOR) 25 MG tablet Take 1 tablet (25 mg total) by mouth 2 (two) times daily. 90 tablet 0  . ranitidine (ZANTAC) 150 MG tablet Take 1 tablet (150 mg total) by mouth 2 (two) times daily. 180 tablet 3  . rivaroxaban (XARELTO) 20 MG TABS tablet Take 1 tablet (20 mg total) by mouth daily. 90 tablet 3  . simvastatin (ZOCOR) 20 MG tablet Take 1 tablet (20 mg total) by mouth at bedtime. 90 tablet 2  . triamcinolone ointment (KENALOG) 0.5 % Apply 1 application topically 2 (two) times daily. Apply to hand rash 60 g 3   No current facility-administered medications for this visit.    Allergies:   Codeine   EKG:  EKG is ordered today. The ekg ordered today shows atrial fibrillation, rate 63 bpm.  QTc 419 msec  ASSESSMENT AND PLAN:  1.  PAROXYSMAL ATRIAL FIBRILLATION:  Reviewed pt's labs.  K was 4.2 and Mg 2.1.  Acceptable to start Tikosyn.  QTc < 440 msec.  Anticoagulated appropriately with Xarelto.  SCr 0.67.  CrCl - 75 mL/min.  Okay to start 515mcg BID.     Vicki [name], [facility]  09/03/2015 9:00 AM     [facility] [place]  [phone] (office) [phone] (fax)

## 2015-09-03 NOTE — H&P (Signed)
History of Present Illness: Vicki [name] is a 69 y.o. female who presents today for electrophysiology evaluation. She has a history of atrial fibrillation which has become persistent, hyperlipidemia, and aortic insufficiency. She has never had an MI or stroke. She had atrial fibrillation which started 5+ years ago and was treated with beta blocker at that time. She was noted to have AF by her PCP who put her on Xarelto for a CHADS2VA2Sc of 2. She presented to the AF clinic feeling poorly and metoprolol was increased.   Today, she denies symptoms of palpitations, chest pain, lower extremity edema, claudication, dizziness, presyncope, syncope, bleeding, or neurologic sequela. The patient is tolerating medications without difficulties. She has been having fatigue, as well as shortness of breath since being back in AF.    Past Medical History  Diagnosis Date  . Allergic rhinitis   . Aortic insufficiency   . Paroxysmal atrial fibrillation     A. fib is now persistent.  . Hyperlipidemia   . GERD (gastroesophageal reflux disease)   . Diverticulosis of colon   . Hx of colonic polyps   . Adenocarcinoma, breast   . DJD (degenerative joint disease)   . Osteopenia   . Anxiety   . Contact dermatitis    Past Surgical History  Procedure Laterality Date  . Abdominal hysterectomy  1984    with 1 ovary removal  . Breast lumpectomy Left 08/2007    with node bx     Current Outpatient Prescriptions  Medication Sig Dispense Refill  . cholecalciferol (VITAMIN D) 400 UNITS TABS tablet Take 400 Units by mouth 2 (two) times daily.    Marland Kitchen loratadine (CLARITIN) 10 MG tablet Take 10 mg by mouth daily.    . metoprolol tartrate (LOPRESSOR) 25 MG tablet Take 1 tablet (25 mg total) by mouth 2 (two) times daily. 90 tablet 0  . metoprolol tartrate (LOPRESSOR) 25 MG tablet TAKE ONE-HALF TABLET BY MOUTH TWICE DAILY 90  tablet 1  . ranitidine (ZANTAC) 150 MG tablet Take 1 tablet (150 mg total) by mouth 2 (two) times daily. 180 tablet 3  . rivaroxaban (XARELTO) 20 MG TABS tablet Take 1 tablet (20 mg total) by mouth daily. 90 tablet 3  . simvastatin (ZOCOR) 20 MG tablet Take 1 tablet (20 mg total) by mouth at bedtime. 90 tablet 2  . triamcinolone ointment (KENALOG) 0.5 % Apply 1 application topically 2 (two) times daily. Apply to hand rash (Patient not taking: Reported on 08/19/2015) 60 g 3   No current facility-administered medications for this visit.    Allergies: Codeine   Social History: The patient  reports that she quit smoking about 38 years ago. Her smoking use included Cigarettes. She has never used smokeless tobacco. She reports that she does not drink alcohol or use illicit drugs.   Family History: The patient's family history includes Clotting disorder (age of onset: 65) in her mother; Healthy in her brother; Heart attack in her father; Heart disease in her mother; Heart disease (age of onset: 13) in her father; Hypertension in her sister; Lymphoma (age of onset: 39) in her sister; Other (age of onset: 70) in her mother.    ROS: Please see the history of present illness. Otherwise, review of systems is positive for DOE, orthopnea, PND. All other systems are reviewed and negative.    PHYSICAL EXAM: VS: There were no vitals taken for this visit. , BMI There is no weight on file to calculate BMI. GEN: Well nourished, well  developed, in no acute distress  HEENT: normal  Neck: no JVD, carotid bruits, or masses Cardiac: irregular rhythm; no murmurs, rubs, or gallops,no edema  Respiratory: clear to auscultation bilaterally, normal work of breathing GI: soft, nontender, nondistended, + BS MS: no deformity or atrophy  Skin: warm and dry Neuro: Strength and sensation are intact Psych: euthymic mood, full affect  EKG: EKG is ordered today. The ekg ordered today  shows atrial fibrillation, rate 70  Recent Labs: 08/07/2015: ALT 14; BUN 11; Creat 0.68; Hemoglobin 12.6; Platelets 296; Potassium 5.1; Sodium 141; TSH 1.733    Lipid Panel   Labs (Brief)       Component Value Date/Time   CHOL 167 07/04/2015 0956   TRIG 117.0 07/04/2015 0956   HDL 65.50 07/04/2015 0956   CHOLHDL 3 07/04/2015 0956   VLDL 23.4 07/04/2015 0956   LDLCALC 78 07/04/2015 0956       Wt Readings from Last 3 Encounters:  08/19/15 128 lb 3.2 oz (58.151 kg)  08/08/15 132 lb (59.875 kg)  07/14/15 130 lb 11.2 oz (59.285 kg)      Other studies Reviewed: Additional studies/ records that were reviewed today include: TTE 07/09/15  Review of the above records today demonstrates:  - Left ventricle: The cavity size was normal. Wall thickness was normal. Systolic function was normal. The estimated ejection fraction was in the range of 55% to 60%. Wall motion was normal; there were no regional wall motion abnormalities. Doppler parameters are consistent with high ventricular filling pressure. - Aortic valve: There was mild regurgitation. - Mitral valve: Calcified annulus. Mildly thickened leaflets . Mild prolapse, involving the anterior leaflet. There was mild regurgitation. - Pulmonary arteries: Systolic pressure was mildly increased. - Pericardium, extracardiac: A trivial pericardial effusion was identified.  Myocardial perfusion imaging 07/09/15 Myocardial perfusion is abnormal. Findings consistent with ischemia. This is a low risk study. Overall left ventricular systolic function was normal. LV cavity size is normal. Nuclear stress EF: 73%. The left ventricular ejection fraction is hyperdynamic (>65%). There is no prior study for comparison.  ASSESSMENT AND PLAN:  1. PAROXYSMAL ATRIAL FIBRILLATION: diagnosed 5 years ago and found again by her PCP who put her on Xarelto for a CHADS2VA2SC of 2. She also is on metoprolol.  Presented to AF clinic with symptomatic AF which was rate controlled. Metoprolol was increased at that time but has continued to have symptoms. Due to her symptoms, will choose a rhythm control strategy. She has possible CAD on her nuclear stress test, so will load her on Tikosyn. Will arrange a time for her to come into the hospital. Should she not remain in sinus on Tikosyn, will plan for ablation.   Current medicines are reviewed at length with the patient today.  The patient does not have concerns regarding her medicines. The following changes were made today: none  Labs/ tests ordered today include:  No orders of the defined types were placed in this encounter     EP Attending  Patient seen and examined. Her ECG has been reviewed. She is admitted today for Tiksoyn loading for the treatment of atrial fibrillation. ECG has been reviewed as have labs. We will proceed with initiation of Tikosyn and plan DCCV on Friday if she has not reverted to NSR.  Mikle Bosworth.D.

## 2015-09-04 DIAGNOSIS — I481 Persistent atrial fibrillation: Secondary | ICD-10-CM | POA: Diagnosis present

## 2015-09-04 DIAGNOSIS — K219 Gastro-esophageal reflux disease without esophagitis: Secondary | ICD-10-CM | POA: Diagnosis present

## 2015-09-04 DIAGNOSIS — Z7901 Long term (current) use of anticoagulants: Secondary | ICD-10-CM | POA: Diagnosis not present

## 2015-09-04 DIAGNOSIS — I4891 Unspecified atrial fibrillation: Secondary | ICD-10-CM | POA: Diagnosis present

## 2015-09-04 DIAGNOSIS — M1990 Unspecified osteoarthritis, unspecified site: Secondary | ICD-10-CM | POA: Diagnosis present

## 2015-09-04 DIAGNOSIS — F419 Anxiety disorder, unspecified: Secondary | ICD-10-CM | POA: Diagnosis present

## 2015-09-04 DIAGNOSIS — E785 Hyperlipidemia, unspecified: Secondary | ICD-10-CM | POA: Diagnosis present

## 2015-09-04 DIAGNOSIS — I351 Nonrheumatic aortic (valve) insufficiency: Secondary | ICD-10-CM | POA: Diagnosis present

## 2015-09-04 DIAGNOSIS — Z79899 Other long term (current) drug therapy: Secondary | ICD-10-CM | POA: Diagnosis not present

## 2015-09-04 DIAGNOSIS — Z87891 Personal history of nicotine dependence: Secondary | ICD-10-CM | POA: Diagnosis not present

## 2015-09-04 DIAGNOSIS — Z853 Personal history of malignant neoplasm of breast: Secondary | ICD-10-CM | POA: Diagnosis not present

## 2015-09-04 LAB — BASIC METABOLIC PANEL
ANION GAP: 6 (ref 5–15)
BUN: 11 mg/dL (ref 6–20)
CALCIUM: 9 mg/dL (ref 8.9–10.3)
CHLORIDE: 103 mmol/L (ref 101–111)
CO2: 29 mmol/L (ref 22–32)
CREATININE: 0.71 mg/dL (ref 0.44–1.00)
GFR calc non Af Amer: >60 mL/min (ref >60)
Glucose, Bld: 101 mg/dL — ABNORMAL HIGH (ref 65–99)
Potassium: 4.5 mmol/L (ref 3.5–5.1)
SODIUM: 138 mmol/L (ref 135–145)

## 2015-09-04 LAB — MAGNESIUM: Magnesium: 2 mg/dL (ref 1.7–2.4)

## 2015-09-04 NOTE — Care Management Note (Addendum)
Case Management Note  Patient Details  Name: Vicki [name] MRN: [id number] Date of Birth: [birth year]  Subjective/Objective:      Pt admitted for Tikosyn Load.               Action/Plan: Benefits check in process for Tikosyn co pay. Will make pt aware once completed. Rx will need to be written for 7 day supply no refills to be filled via [facility] on day of dc. CM to assist with 7 day supply. Pt will need original Rx as well with refills. CM did call [facility] in [place] and Tikosyn generic can be ordered. No further needs at this time from CM.   Expected Discharge Date:                  Expected Discharge Plan:  Home/Self Care  In-House Referral:  NA  Discharge planning Services  CM Consult, Medication Assistance  Post Acute Care Choice:  NA Choice offered to:  NA  DME Arranged:  N/A DME Agency:  NA  HH Arranged:  NA HH Agency:  NA  Status of Service:     Medicare Important Message Given:    Date Medicare IM Given:    Medicare IM give by:    Date Additional Medicare IM Given:    Additional Medicare Important Message give by:     If discussed at [place] of Stay Meetings, dates discussed:    Additional Comments:1503 09-04-15 Brianny Soulliere, RN,BSN [phone] Tikosyn: Pt copay will be $45 -prior auth not required. No further needs From CM at this time.   Bethena Roys, RN 09/04/2015, 10:31 AM

## 2015-09-04 NOTE — Progress Notes (Signed)
D.Patel, MD made aware of pt conversion to SR/SB(50s) after 5.81 second pause. Pt states she felt the pause but is asymptomatic at this time. Strip saved in EPIC 23:45. EKG obtained verifying SB. Will continue to monitor closely.

## 2015-09-04 NOTE — Discharge Instructions (Addendum)
You have an appointment set up with the [facility].  Multiple studies have shown that being followed by a dedicated atrial fibrillation clinic in addition to the standard care you receive from your other physicians improves health. We believe that enrollment in the atrial fibrillation clinic will allow [place] to better care for you.   The phone number to the [facility] is [phone]. The clinic is staffed Monday through Friday from 8:30am to 5pm.  Parking Directions: The clinic is located in the Heart and Vascular Building connected to [facility]. 1)From [address] turn on to [address] and go to the 3rd entrance  (Heart and Vascular entrance) on the right. 2)Look to the right for Heart &Vascular Parking Garage. 3)A code for the entrance is required please call the clinic to receive this.   4)Take the elevators to the 1st floor. Registration is in the room with the glass walls at the end of the hallway.  If you have any trouble parking or locating the clinic, please dont hesitate to call [phone].  Information on my medicine - XARELTO (Rivaroxaban)  This medication education was reviewed with me or my healthcare representative as part of my discharge preparation.   Why was Xarelto prescribed for you? Xarelto was prescribed for you to reduce the risk of a blood clot forming that can cause a stroke if you have a medical condition called atrial fibrillation (a type of irregular heartbeat).  What do you need to know about xarelto ? Take your Xarelto ONCE DAILY at the same time every day with your evening meal. If you have difficulty swallowing the tablet whole, you may crush it and mix in applesauce just prior to taking your dose.  Take Xarelto exactly as prescribed by your doctor and DO NOT stop taking Xarelto without talking to the doctor who prescribed the medication.  Stopping without other stroke prevention medication to take the  place of Xarelto may increase your risk of developing a clot that causes a stroke.  Refill your prescription before you run out.  After discharge, you should have regular check-up appointments with your healthcare provider that is prescribing your Xarelto.  In the future your dose may need to be changed if your kidney function or weight changes by a significant amount.  What do you do if you miss a dose? If you are taking Xarelto ONCE DAILY and you miss a dose, take it as soon as you remember on the same day then continue your regularly scheduled once daily regimen the next day. Do not take two doses of Xarelto at the same time or on the same day.   Important Safety Information A possible side effect of Xarelto is bleeding. You should call your healthcare provider right away if you experience any of the following: ? Bleeding from an injury or your nose that does not stop. ? Unusual colored urine (red or dark brown) or unusual colored stools (red or black). ? Unusual bruising for unknown reasons. ? A serious fall or if you hit your head (even if there is no bleeding).  Some medicines may interact with Xarelto and might increase your risk of bleeding while on Xarelto. To help avoid this, consult your healthcare provider or pharmacist prior to using any new prescription or non-prescription medications, including herbals, vitamins, non-steroidal anti-inflammatory drugs (NSAIDs) and supplements.  This website has more information on Xarelto: [email or web address]-benson.[email or web address]  Call the office if any questions or problems. You will have labs checked  in the office and EKG.   Do not miss any Xarelto or tikosyn.

## 2015-09-04 NOTE — Progress Notes (Signed)
SUBJECTIVE: The patient is doing well today.  At this time, she denies chest pain, shortness of breath, or any new concerns.  Admitted 09/03/15 for Tikosyn load, converted to SR after first dose  CURRENT MEDICATIONS: . cholecalciferol  1,000 Units Oral BID  . dofetilide  500 mcg Oral BID  . famotidine  20 mg Oral Daily  . loratadine  10 mg Oral Daily  . metoprolol tartrate  25 mg Oral BID  . rivaroxaban  20 mg Oral Q supper  . simvastatin  20 mg Oral QHS  . sodium chloride  3 mL Intravenous Q12H  . zolpidem  5 mg Oral Once      OBJECTIVE: Physical Exam: Filed Vitals:   09/03/15 1346 09/03/15 2014  BP: 138/74 134/74  Pulse:  82  Temp: 97.9 F (36.6 C) 98.2 F (36.8 C)  TempSrc: Oral Oral  Resp: 15 18  Height: 5\' 3"  (1.6 m)   Weight: 130 lb 11.2 oz (59.285 kg)   SpO2: 98% 97%   No intake or output data in the 24 hours ending 09/04/15 0745  Telemetry reveals atrial fibrillation with a 5.81 post termination pause with some junctional escapes  GEN- The patient is well appearing, alert and oriented x 3 today.   Head- normocephalic, atraumatic Eyes-  Sclera clear, conjunctiva pink Ears- hearing intact Oropharynx- clear Neck- supple  Lungs- Clear to ausculation bilaterally, normal work of breathing Heart- Regular rate and rhythm  GI- soft, NT, ND, + BS Extremities- no clubbing, cyanosis, or edema Skin- no rash or lesion Psych- euthymic mood, full affect Neuro- strength and sensation are intact  LABS: Basic Metabolic Panel:  Recent Labs  09/03/15 0945 09/04/15 0425  NA 138 138  K 4.2 4.5  CL 103 103  CO2 29 29  GLUCOSE 118* 101*  BUN 15 11  CREATININE 0.67 0.71  CALCIUM 9.4 9.0  MG 2.1 2.0    RADIOLOGY: Dg Chest 1 View 08/15/2015   CLINICAL DATA:  Previously seen abnormality on lateral chest x-ray.  EXAM: CHEST  1 VIEW  COMPARISON:  08/07/2015  FINDINGS: Single lateral chest radiograph was presented for interpretation. Cardiac silhouette appears mildly  enlarged when compared to the prior study. The previously noted nodular asymmetry in the retrosternal region persists. There is blunting of probably right costophrenic angle, which may be seen with small pleural effusion. No evidence of pneumothorax.  Osseous structures demonstrate mild osteoarthritic changes of the thoracic spine.  IMPRESSION: Persistent retrosternal opacity. Further evaluation with chest CT is recommended.  Apparent interval enlargement of the cardiac silhouette, and developing of probable small right pleural effusion.   Electronically Signed   By: Fidela Salisbury M.D.   On: 08/15/2015 14:20    ASSESSMENT AND PLAN:  Active Problems:   PAROXYSMAL ATRIAL FIBRILLATION   1.  Persistent atrial fibrillation The patient has persistent symptomatic atrial fibrillation and was admitted 09/03/15 for Tikosyn load. She converted to SR after 1st dose with 5.81 second pause.  K and Mg stable QTc stable Continue Tikosyn 568mcg twice daily Continue Xarelto for CHADS2VASC of at least 2  2.  Post termination pauses If she is unable to maintain SR, may need to consider PPM for post termination pauses She would be a reasonable Leadless PPM candidate with normal sinus rates  Anticipate discharge on Saturday. Will need 1 and 4 week follow up in AF clinic and 3 month follow up with Dr Curt Bears (appts scheduled and entered in AVS)  Chanetta Marshall, NP  09/04/2015 7:55 AM  I have seen and examined this patient with Chanetta Marshall.  Agree with above, note added to reflect my findings.  On exam, RRR, no murmurs, lungs clear.  Converted to SR after initial dose of Tikosyn yesterday.  Will plan for continuation at current dose.  Had 5.8 second pause, will continue to monitor for need for pacemaker.    Will M. Camnitz MD 09/04/2015 1:41 PM

## 2015-09-05 LAB — BASIC METABOLIC PANEL
Anion gap: 7 (ref 5–15)
BUN: 9 mg/dL (ref 6–20)
CALCIUM: 8.8 mg/dL — AB (ref 8.9–10.3)
CO2: 28 mmol/L (ref 22–32)
Chloride: 99 mmol/L — ABNORMAL LOW (ref 101–111)
Creatinine, Ser: 0.7 mg/dL (ref 0.44–1.00)
GFR calc non Af Amer: >60 mL/min (ref >60)
GLUCOSE: 105 mg/dL — AB (ref 65–99)
POTASSIUM: 4 mmol/L (ref 3.5–5.1)
Sodium: 134 mmol/L — ABNORMAL LOW (ref 135–145)

## 2015-09-05 LAB — MAGNESIUM: Magnesium: 2 mg/dL (ref 1.7–2.4)

## 2015-09-05 NOTE — Progress Notes (Signed)
SUBJECTIVE: The patient is doing well today.  At this time, she denies chest pain, shortness of breath, or any new concerns.  Admitted 09/03/15 for Tikosyn load, converted to SR after first dose  CURRENT MEDICATIONS: . cholecalciferol  1,000 Units Oral BID  . dofetilide  500 mcg Oral BID  . famotidine  20 mg Oral Daily  . loratadine  10 mg Oral Daily  . rivaroxaban  20 mg Oral Q supper  . simvastatin  20 mg Oral QHS  . sodium chloride  3 mL Intravenous Q12H  . zolpidem  5 mg Oral Once      OBJECTIVE: Physical Exam: Filed Vitals:   09/04/15 1423 09/04/15 1557 09/04/15 2025 09/05/15 0424  BP: 102/62 130/66 128/69 132/77  Pulse: 56 54 55 51  Temp: 97.9 F (36.6 C)  98 F (36.7 C) 97.6 F (36.4 C)  TempSrc: Oral  Oral Oral  Resp: 18  18 18   Height:      Weight:    129 lb 6.4 oz (58.695 kg)  SpO2: 98% 99% 96% 96%    Intake/Output Summary (Last 24 hours) at 09/05/15 0746 Last data filed at 09/05/15 0532  Gross per 24 hour  Intake   1200 ml  Output    800 ml  Net    400 ml    Telemetry reveals sinus bradycardia  GEN- The patient is well appearing, alert and oriented x 3 today.   Head- normocephalic, atraumatic Eyes-  Sclera clear, conjunctiva pink Ears- hearing intact Oropharynx- clear Neck- supple  Lungs- Clear to ausculation bilaterally, normal work of breathing Heart- Regular rate and rhythm  GI- soft, NT, ND, + BS Extremities- no clubbing, cyanosis, or edema Skin- no rash or lesion Psych- euthymic mood, full affect Neuro- strength and sensation are intact  LABS: Basic Metabolic Panel:  Recent Labs  09/04/15 0425 09/05/15 0442  NA 138 134*  K 4.5 4.0  CL 103 99*  CO2 29 28  GLUCOSE 101* 105*  BUN 11 9  CREATININE 0.71 0.70  CALCIUM 9.0 8.8*  MG 2.0 2.0    RADIOLOGY: Dg Chest 1 View 08/15/2015   CLINICAL DATA:  Previously seen abnormality on lateral chest x-ray.  EXAM: CHEST  1 VIEW  COMPARISON:  08/07/2015  FINDINGS: Single lateral chest  radiograph was presented for interpretation. Cardiac silhouette appears mildly enlarged when compared to the prior study. The previously noted nodular asymmetry in the retrosternal region persists. There is blunting of probably right costophrenic angle, which may be seen with small pleural effusion. No evidence of pneumothorax.  Osseous structures demonstrate mild osteoarthritic changes of the thoracic spine.  IMPRESSION: Persistent retrosternal opacity. Further evaluation with chest CT is recommended.  Apparent interval enlargement of the cardiac silhouette, and developing of probable small right pleural effusion.   Electronically Signed   By: Fidela Salisbury M.D.   On: 08/15/2015 14:20    ASSESSMENT AND PLAN:  Active Problems:   PAROXYSMAL ATRIAL FIBRILLATION   1.  Persistent atrial fibrillation The patient has persistent symptomatic atrial fibrillation and was admitted 09/03/15 for Tikosyn load. She converted to SR after 1st dose with 5.81 second pause.  K and Mg stable QTc stable Continue Tikosyn 513mcg twice daily Continue Xarelto for CHADS2VASC of at least 2  2.  Post termination pauses If she is unable to maintain SR, may need to consider PPM for post termination pauses She would be a reasonable Leadless PPM candidate with normal sinus rates  3.  Sinus bradycardia Asymptomatic Will discontinue BB at this time  Anticipate discharge on Saturday. Will need 1 and 4 week follow up in AF clinic and 3 month follow up with Dr Curt Bears (appts scheduled and entered in AVS)  Chanetta Marshall, NP 09/05/2015 7:46 AM   I have seen and examined this patient with Chanetta Marshall.  Agree with above, note added to reflect my findings.  On exam, regular rhythm, no murmurs, no wheezing.  Patient feeling well, in sinus rhythm, anxious to leave hospital.  Plan for discharge tomorrow.    Will M. Camnitz MD 09/05/2015 3:03 PM

## 2015-09-05 NOTE — Care Management Important Message (Signed)
Important Message  Patient Details  Name: Vicki [name] MRN: [id number] Date of Birth: 05-07-1946   Medicare Important Message Given:  Yes-second notification given    Nathen May 09/05/2015, 12:40 PM

## 2015-09-06 ENCOUNTER — Encounter (HOSPITAL_COMMUNITY): Payer: Self-pay | Admitting: Cardiology

## 2015-09-06 DIAGNOSIS — Z5181 Encounter for therapeutic drug level monitoring: Secondary | ICD-10-CM

## 2015-09-06 DIAGNOSIS — Z79899 Other long term (current) drug therapy: Secondary | ICD-10-CM

## 2015-09-06 HISTORY — DX: Other long term (current) drug therapy: Z79.899

## 2015-09-06 HISTORY — DX: Encounter for therapeutic drug level monitoring: Z51.81

## 2015-09-06 LAB — BASIC METABOLIC PANEL
ANION GAP: 6 (ref 5–15)
BUN: 9 mg/dL (ref 6–20)
CALCIUM: 9.4 mg/dL (ref 8.9–10.3)
CO2: 30 mmol/L (ref 22–32)
CREATININE: 0.73 mg/dL (ref 0.44–1.00)
Chloride: 99 mmol/L — ABNORMAL LOW (ref 101–111)
GFR calc Af Amer: >60 mL/min (ref >60)
GLUCOSE: 106 mg/dL — AB (ref 65–99)
Potassium: 4 mmol/L (ref 3.5–5.1)
Sodium: 135 mmol/L (ref 135–145)

## 2015-09-06 LAB — MAGNESIUM: MAGNESIUM: 2.1 mg/dL (ref 1.7–2.4)

## 2015-09-06 MED ORDER — FAMOTIDINE 20 MG PO TABS: 20.0000 mg | ORAL_TABLET | Freq: Every day | ORAL | Status: DC

## 2015-09-06 MED ORDER — DOFETILIDE 500 MCG PO CAPS: 500.0000 ug | ORAL_CAPSULE | Freq: Two times a day (BID) | ORAL | Status: DC

## 2015-09-06 NOTE — Progress Notes (Signed)
Patient discharge paperwork and instructions gone over in detail with patient and husband. All questions were answered to patients satisfaction. Patient's Tikosyn prescription given to patient upon discharge. Telemetry discontinued, IV removed intact. Patient discharged to home with husband.

## 2015-09-06 NOTE — Progress Notes (Signed)
SUBJECTIVE: The patient is doing well today.  At this time, she denies chest pain, shortness of breath, or any new concerns.  She is anxious to return home.   Admitted 09/03/15 for Tikosyn load, converted to SR after first dose  CURRENT MEDICATIONS: . cholecalciferol  1,000 Units Oral BID  . dofetilide  500 mcg Oral BID  . famotidine  20 mg Oral Daily  . loratadine  10 mg Oral Daily  . rivaroxaban  20 mg Oral Q supper  . simvastatin  20 mg Oral QHS  . sodium chloride  3 mL Intravenous Q12H  . zolpidem  5 mg Oral Once      OBJECTIVE: Physical Exam: Filed Vitals:   09/05/15 1422 09/05/15 2000 09/05/15 2315 09/06/15 0444  BP: 144/73 130/58 119/73 141/73  Pulse: 51 52 51 51  Temp: 98 F (36.7 C) 97.7 F (36.5 C) 97.7 F (36.5 C) 97.8 F (36.6 C)  TempSrc: Oral Oral Oral Oral  Resp: 18 16 18    Height:      Weight:    127 lb 8 oz (57.834 kg)  SpO2: 99% 97% 97% 98%    Intake/Output Summary (Last 24 hours) at 09/06/15 0818 Last data filed at 09/06/15 0818  Gross per 24 hour  Intake    243 ml  Output   2650 ml  Net  -2407 ml    Telemetry reveals sinus bradycardia  GEN- The patient is well appearing, alert and oriented x 3 today.   Head- normocephalic, atraumatic Eyes-  Sclera clear, conjunctiva pink Ears- hearing intact Oropharynx- clear Neck- supple  Lungs- Clear to ausculation bilaterally, normal work of breathing Heart- Regular rate and rhythm  GI- soft, NT, ND, + BS Extremities- no clubbing, cyanosis, or edema Skin- no rash or lesion Psych- euthymic mood, full affect Neuro- strength and sensation are intact  LABS: Basic Metabolic Panel:  Recent Labs  09/05/15 0442 09/06/15 0550  NA 134* 135  K 4.0 4.0  CL 99* 99*  CO2 28 30  GLUCOSE 105* 106*  BUN 9 9  CREATININE 0.70 0.73  CALCIUM 8.8* 9.4  MG 2.0 2.1    RADIOLOGY: Dg Chest 1 View 08/15/2015   CLINICAL DATA:  Previously seen abnormality on lateral chest x-ray.  EXAM: CHEST  1 VIEW   COMPARISON:  08/07/2015  FINDINGS: Single lateral chest radiograph was presented for interpretation. Cardiac silhouette appears mildly enlarged when compared to the prior study. The previously noted nodular asymmetry in the retrosternal region persists. There is blunting of probably right costophrenic angle, which may be seen with small pleural effusion. No evidence of pneumothorax.  Osseous structures demonstrate mild osteoarthritic changes of the thoracic spine.  IMPRESSION: Persistent retrosternal opacity. Further evaluation with chest CT is recommended.  Apparent interval enlargement of the cardiac silhouette, and developing of probable small right pleural effusion.   Electronically Signed   By: Fidela Salisbury M.D.   On: 08/15/2015 14:20    ASSESSMENT AND PLAN:  Active Problems:   PAROXYSMAL ATRIAL FIBRILLATION   1.  Persistent atrial fibrillation The patient has persistent symptomatic atrial fibrillation and was admitted 09/03/15 for Tikosyn load. She converted to SR after 1st dose with 5.81 second pause.  K and Mg stable QTc stable Continue Tikosyn 521mcg twice daily Continue Xarelto for CHADS2VASC of at least 2 Plan for discharge home today with Tikosyn 500 mg.  2.  Post termination pauses If she is unable to maintain SR, may need to consider PPM for  post termination pauses Joyann Spidle continue to monitor and see her back in clinic.  3.  Sinus bradycardia Asymptomatic Lashun Ramseyer discontinue BB at this time  Anticipate discharge on Saturday. Jveon Pound need 1 and 4 week follow up in AF clinic and 3 month follow up with Dr Curt Bears (appts scheduled and entered in AVS)  Jadon Harbaugh M. Kimberlynn Lumbra MD 09/06/2015 8:18 AM

## 2015-09-06 NOTE — Discharge Summary (Signed)
Physician Discharge Summary       Patient ID: Vicki [name] MRN: [id number] DOB/AGE: Feb 12, 1946 69 y.o.  Admit date: 09/03/2015 Discharge date: 09/06/2015 Primary Cardiologist: Dr. Adora Fridge EP:  Dr. Curt Bears     Discharge Diagnoses:  Principal Problem:   Visit for monitoring Tikosyn therapy Active Problems:   PAROXYSMAL ATRIAL FIBRILLATION   Discharged Condition: good  Procedures: none  Hospital Course: 69 year old female with hx of atrial fibrillation which has become persistent, hyperlipidemia, and aortic insufficiency. She has never had an MI or stroke. She had atrial fibrillation which started 5+ years ago and was treated with beta blocker at that time. She was noted to have AF by her PCP who put her on Xarelto for a CHADS2VA2Sc of 2. She presented to the AF clinic feeling poorly and metoprolol was increased. She was seen and evaluated by Dr. Lovena Le and after discussion plans were to bring her in for tikosyn load.    She converted on tikosyn to SR.  She did have termination pause of 5.81 sec.  She was loaded on Tikosyn without complications.  Day of discharge her Qtc was 447 ms.  She was seen and evaluated by Dr. Curt Bears and found stable for discharge.  She will follow up in a fib clinic for EKG, BMP and Mag.   She was discharged on Tikosyn 57mcg twice daily and to continue Xarelto for [facility] of at least 2. Per Dr. Macky Lower note 'If she is unable to maintain SR, may need to consider PPM for post termination pauses"   Consults: None  Significant Diagnostic Studies:  BMP Latest Ref Rng 09/06/2015 09/05/2015 09/04/2015  Glucose 65 - 99 mg/dL 106(H) 105(H) 101(H)  BUN 6 - 20 mg/dL 9 9 11   Creatinine 0.44 - 1.00 mg/dL 0.73 0.70 0.71  Sodium 135 - 145 mmol/L 135 134(L) 138  Potassium 3.5 - 5.1 mmol/L 4.0 4.0 4.5  Chloride 101 - 111 mmol/L 99(L) 99(L) 103  CO2 22 - 32 mmol/L 30 28 29   Calcium 8.9 - 10.3 mg/dL 9.4 8.8(L) 9.0   CBC Latest Ref Rng 08/07/2015 07/04/2015  01/03/2015  WBC 4.0 - 10.5 K/uL 6.2 7.6 6.9  Hemoglobin 12.0 - 15.0 g/dL 12.6 14.1 14.7  Hematocrit 36.0 - 46.0 % 37.4 41.0 43.0  Platelets 150 - 400 K/uL 296 283.0 304.0   Mg+ 2.1 at discharge.    EKG: [id number] 06-Sep-2015 11:11:14 [facility] ROUTINE RECORD Sinus bradycardia Nonspecific T wave abnormality Abnormal ECG 22mm/s 43mm/mV 100Hz  8.0 SP2 12SL 241 HD CID: [id number] Referred by: Conley Canal MD Unconfirmed Vent. rate 54 BPM PR interval 156 ms QRS duration 80 ms QT/QTc 472/447 ms P-R-T axes 58 -27 66  Discharge Exam: Blood pressure 126/66, pulse 52, temperature 97.9 F (36.6 C), temperature source Oral, resp. rate 18, height 5\' 3"  (1.6 m), weight 127 lb 8 oz (57.834 kg), SpO2 98 %.  Disposition: home     Medication List    STOP taking these medications        metoprolol tartrate 25 MG tablet  Commonly known as:  LOPRESSOR     ranitidine 150 MG tablet  Commonly known as:  ZANTAC  Replaced by:  famotidine 20 MG tablet      TAKE these medications        cholecalciferol 1000 UNITS tablet  Commonly known as:  VITAMIN D  Take 1,000 Units by mouth 2 (two) times daily.     dofetilide 500 MCG capsule  Commonly known  as:  TIKOSYN  Take 1 capsule (500 mcg total) by mouth 2 (two) times daily.     famotidine 20 MG tablet  Commonly known as:  PEPCID  Take 1 tablet (20 mg total) by mouth daily.     loratadine 10 MG tablet  Commonly known as:  CLARITIN  Take 10 mg by mouth daily.     rivaroxaban 20 MG Tabs tablet  Commonly known as:  XARELTO  Take 1 tablet (20 mg total) by mouth daily.     simvastatin 20 MG tablet  Commonly known as:  ZOCOR  Take 1 tablet (20 mg total) by mouth at bedtime.       Follow-up Information    Follow up with [facility] On 09/15/2015.   Why:  at [facility] information:   [place] [phone]      Follow up with [facility] On 10/06/2015.   Why:  at [facility] information:   [address] [place] [phone]      Follow up with Will Meredith Leeds, MD On 11/25/2015.   Specialty:  Cardiology   Why:  at 9:15AM   Contact information:   [place] [address] [phone]        Discharge Instructions: Call the office if any questions or problems. You will have labs checked in the office and EKG.   Do not miss any Xarelto or tikosyn.   Signed: Isaiah Serge Nurse Practitioner-Certified [facility]: [facility] 09/06/2015, 3:10 PM  Time spent on discharge :> 30 minutes.    I have seen and examined this patient with Cecilie Kicks.  Agree with above, note added to reflect my findings.  On exam, regular rhythm, no murmurs, lungs clear.  Tikosyn load with conversion after first dose.  Discharged on 500 mg BID and tolerating well without complication.      Will M. Camnitz MD 09/06/2015 5:43 PM

## 2015-09-15 ENCOUNTER — Ambulatory Visit (HOSPITAL_COMMUNITY)
Admit: 2015-09-15 | Discharge: 2015-09-15 | Disposition: A | Discharge status: Home / Self Care | Payer: PPO | Source: Ambulatory Visit | Attending: Nurse Practitioner | Admitting: Nurse Practitioner

## 2015-09-15 VITALS — BP 120/70 | HR 58 | Ht 63.0 in | Wt 130.2 lb

## 2015-09-15 DIAGNOSIS — R918 Other nonspecific abnormal finding of lung field: Secondary | ICD-10-CM | POA: Diagnosis not present

## 2015-09-15 DIAGNOSIS — I481 Persistent atrial fibrillation: Secondary | ICD-10-CM | POA: Diagnosis present

## 2015-09-15 DIAGNOSIS — I998 Other disorder of circulatory system: Secondary | ICD-10-CM | POA: Diagnosis not present

## 2015-09-15 DIAGNOSIS — I4819 Other persistent atrial fibrillation: Secondary | ICD-10-CM

## 2015-09-15 MED ORDER — RIVAROXABAN 20 MG PO TABS: 20.0000 mg | ORAL_TABLET | Freq: Every day | ORAL | Status: DC

## 2015-09-16 ENCOUNTER — Encounter (HOSPITAL_COMMUNITY): Payer: Self-pay | Admitting: Nurse Practitioner

## 2015-09-16 NOTE — Progress Notes (Signed)
Patient ID: Vicki [name], female   DOB: September 04, 1946, 69 y.o.   MRN: [id number]      Primary Care Physician: Walker Kehr, MD Referring Physician: Dr. Gwenlyn Found Electrophysiologist: Dr. Karlton Lemon Vicki [name] is a 69 y.o. female with a h/o newlt diagnosed afib, pt of Dr. Kennon Holter, for f/u in the afib clinic after tikosyn loading one week ago. She is in NSR today but feels that she is still having some breakthrough afib that resloves when she sits down for a few mintes. She notices it mostly in the am when she is getting dressed for the day.  She is doing well with DOAC, no bleeding issues.Recent echo, normal function, mild AI, normal left atrial size. Stress test did show mild low anteroapical ischemia but Dr. Gwenlyn Found thought clinically she was asymptomatic and LHC not needed at this time. Labs checked 10/1 with potassium and magnesium at appropriate levels.  No alcohol use, minimal caffeine use, no snoring history. No regular exercise, but is not obese. She recently had a chest CT which showed a stable nodule, which will need further f/u at at later date.  Today, she denies symptoms of palpitations, chest pain, shortness of breath, orthopnea, PND, lower extremity edema, dizziness, presyncope, syncope, or neurologic sequela. Spells of weakness usually associated with elevated heart rate in the 90's. The patient is tolerating medications without difficulties and is otherwise without complaint today.   Past Medical History  Diagnosis Date  . Allergic rhinitis   . Aortic insufficiency   . Paroxysmal atrial fibrillation (HCC)     A. fib is now persistent.  . Hyperlipidemia   . GERD (gastroesophageal reflux disease)   . Diverticulosis of colon   . Hx of colonic polyps   . Adenocarcinoma, breast ([place])   . DJD (degenerative joint disease)   . Osteopenia   . Anxiety   . Contact dermatitis   . Visit for monitoring Tikosyn therapy 09/06/2015   Past Surgical History  Procedure Laterality Date   . Abdominal hysterectomy  1984    with 1 ovary removal  . Breast lumpectomy Left 08/2007     with node bx    Current Outpatient Prescriptions  Medication Sig Dispense Refill  . cholecalciferol (VITAMIN D) 1000 UNITS tablet Take 1,000 Units by mouth 2 (two) times daily.    Marland Kitchen dofetilide (TIKOSYN) 500 MCG capsule Take 1 capsule (500 mcg total) by mouth 2 (two) times daily. 60 capsule 11  . famotidine (PEPCID) 20 MG tablet Take 1 tablet (20 mg total) by mouth daily. 30 tablet 6  . loratadine (CLARITIN) 10 MG tablet Take 10 mg by mouth daily.    . rivaroxaban (XARELTO) 20 MG TABS tablet Take 1 tablet (20 mg total) by mouth daily. 90 tablet 3  . simvastatin (ZOCOR) 20 MG tablet Take 1 tablet (20 mg total) by mouth at bedtime. 90 tablet 2   No current facility-administered medications for this encounter.    Allergies  Allergen Reactions  . Codeine     REACTION: unknown    Social History   Social History  . Marital Status: Married    Spouse Name: N/A  . Number of Children: 1  . Years of Education: N/A   Occupational History  . [employer]   Social History Main Topics  . Smoking status: Former Smoker    Types: Cigarettes    Quit date: 12/06/1976  . Smokeless tobacco: Never Used  . Alcohol Use: No  . Drug Use:  No  . Sexual Activity: Not on file   Other Topics Concern  . Not on file   Social History Narrative    Family History  Problem Relation Age of Onset  . Other Mother 69    AVR  . Heart disease Mother   . Clotting disorder Mother 2  . Heart disease Father 36  . Heart attack Father   . Lymphoma Sister 19  . Hypertension Sister   . Healthy Brother     ROS- All systems are reviewed and negative except as per the HPI above  Physical Exam: Filed Vitals:   09/15/15 1426  BP: 120/70  Pulse: 58  Height: 5\' 3"  (1.6 m)  Weight: 130 lb 3.2 oz (59.058 kg)    GEN- The patient is well appearing, alert and oriented x 3 today.   Head-  normocephalic, atraumatic Eyes-  Sclera clear, conjunctiva pink Ears- hearing intact Oropharynx- clear Neck- supple, no JVP Lymph- no cervical lymphadenopathy Lungs- Clear to ausculation bilaterally, normal work of breathing Heart- Irregular rate and rhythm, no murmurs, rubs or gallops, PMI not laterally displaced GI- soft, NT, ND, + BS Extremities- no clubbing, cyanosis, or edema MS- no significant deformity or atrophy Skin- no rash or lesion Psych- euthymic mood, full affect Neuro- strength and sensation are intact  EKG- SR at 58 bpm, LAD, Pr int 150 ms, QRS int 80 ms, QTc 441 ms. Echo/stress myoview- see HPI EPic records reviewed  Assessment and Plan:  1. Persistent afib  Continue tikosyn 500 mg bid Can take 1/2 tab of metoprolol 25 mg if elevated heart rate persists over several minutes Continue xarelto  2. Abnormal CXR Chest CT with stable nodule F/u per Dr. Gwenlyn Found   3. Mild ischemia on myoview Clinically no chest pain and LHC is deemed unecessary at this time per Dr. Gwenlyn Found Would avoid 1c agents   Afib clinic  F/u in one week Consider repeat bmet/mag of f/u  Butch Penny C. Nyshawn Gowdy, [facility] [address] [place], [place] [address] [phone]

## 2015-09-22 ENCOUNTER — Ambulatory Visit (HOSPITAL_COMMUNITY)
Admission: RE | Admit: 2015-09-22 | Discharge: 2015-09-22 | Disposition: A | Discharge status: Home / Self Care | Payer: PPO | Source: Ambulatory Visit | Attending: Nurse Practitioner | Admitting: Nurse Practitioner

## 2015-09-22 ENCOUNTER — Encounter (HOSPITAL_COMMUNITY): Payer: Self-pay | Admitting: Nurse Practitioner

## 2015-09-22 VITALS — BP 136/76 | HR 54 | Ht 63.0 in | Wt 130.8 lb

## 2015-09-22 DIAGNOSIS — I998 Other disorder of circulatory system: Secondary | ICD-10-CM | POA: Diagnosis not present

## 2015-09-22 DIAGNOSIS — R918 Other nonspecific abnormal finding of lung field: Secondary | ICD-10-CM | POA: Insufficient documentation

## 2015-09-22 DIAGNOSIS — I4819 Other persistent atrial fibrillation: Secondary | ICD-10-CM

## 2015-09-22 DIAGNOSIS — I481 Persistent atrial fibrillation: Secondary | ICD-10-CM | POA: Diagnosis present

## 2015-09-22 LAB — BASIC METABOLIC PANEL
Anion gap: 8 (ref 5–15)
BUN: 13 mg/dL (ref 6–20)
CHLORIDE: 102 mmol/L (ref 101–111)
CO2: 27 mmol/L (ref 22–32)
CREATININE: 0.7 mg/dL (ref 0.44–1.00)
Calcium: 9.3 mg/dL (ref 8.9–10.3)
GFR calc Af Amer: >60 mL/min (ref >60)
GFR calc non Af Amer: >60 mL/min (ref >60)
GLUCOSE: 85 mg/dL (ref 65–99)
POTASSIUM: 4.4 mmol/L (ref 3.5–5.1)
SODIUM: 137 mmol/L (ref 135–145)

## 2015-09-22 LAB — MAGNESIUM: Magnesium: 2.3 mg/dL (ref 1.7–2.4)

## 2015-09-22 NOTE — Progress Notes (Signed)
Patient ID: Vicki [name], female   DOB: 01/13/1946, 69 y.o.   MRN: [id number]      Primary Care Physician: Vicki Kehr, MD Referring Physician: Dr. [name] Electrophysiologist: Dr. Karlton [name] Vicki [name] is a 69 y.o. female with a h/o newly diagnosed afib, pt of Dr. Kennon [name], for f/u in the afib clinic after tikosyn loading two weeks ago. She is in NSR today and feels so much better but is having some weak spells that is associated with a  higher heart rate in the upper 80's/90's, that might represent some breakthrough afib, that resolves  after a short period of time. She is doing well with DOAC, no bleeding issues.Recent echo, normal function, mild AI, normal left atrial size. Stress test did show mild low anteroapical ischemia but Dr. [name] thought clinically she was asymptomatic and LHC not needed at this time. Labs checked 10/1 with potassium and magnesium at appropriate levels.  No alcohol use, minimal caffeine use, no snoring history. No regular exercise, but is not obese. She recently had a chest CT which showed a stable nodule, which will need further f/u at at later date.  Today, she denies symptoms of palpitations, chest pain, shortness of breath, orthopnea, PND, lower extremity edema, dizziness, presyncope, syncope, or neurologic sequela. Spells of weakness usually associated with elevated heart rate in the 90's. The patient is tolerating medications without difficulties and is otherwise without complaint today.   Past Medical History  Diagnosis Date  . Allergic rhinitis   . Aortic insufficiency   . Paroxysmal atrial fibrillation (HCC)     A. fib is now persistent.  . Hyperlipidemia   . GERD (gastroesophageal reflux disease)   . Diverticulosis of colon   . Hx of colonic polyps   . Adenocarcinoma, breast ([place])   . DJD (degenerative joint disease)   . Osteopenia   . Anxiety   . Contact dermatitis   . Visit for monitoring Tikosyn therapy 09/06/2015   Past Surgical  History  Procedure Laterality Date  . Abdominal hysterectomy  1984    with 1 ovary removal  . Breast lumpectomy Left 08/2007     with node bx    Current Outpatient Prescriptions  Medication Sig Dispense Refill  . cholecalciferol (VITAMIN D) 1000 UNITS tablet Take 1,000 Units by mouth 2 (two) times daily.    Marland Kitchen dofetilide (TIKOSYN) 500 MCG capsule Take 1 capsule (500 mcg total) by mouth 2 (two) times daily. 60 capsule 11  . famotidine (PEPCID) 20 MG tablet Take 1 tablet (20 mg total) by mouth daily. 30 tablet 6  . loratadine (CLARITIN) 10 MG tablet Take 10 mg by mouth daily.    . rivaroxaban (XARELTO) 20 MG TABS tablet Take 1 tablet (20 mg total) by mouth daily. 90 tablet 3  . simvastatin (ZOCOR) 20 MG tablet Take 1 tablet (20 mg total) by mouth at bedtime. 90 tablet 2   No current facility-administered medications for this encounter.    Allergies  Allergen Reactions  . Codeine     REACTION: unknown    Social History   Social History  . Marital Status: Married    Spouse Name: N/A  . Number of Children: 1  . Years of Education: N/A   Occupational History  . [employer]   Social History Main Topics  . Smoking status: Former Smoker    Types: Cigarettes    Quit date: 12/06/1976  . Smokeless tobacco: Never Used  . Alcohol Use: No  .  Drug Use: No  . Sexual Activity: Not on file   Other Topics Concern  . Not on file   Social History Narrative    Family History  Problem Relation Age of Onset  . Other Mother 65    AVR  . Heart disease Mother   . Clotting disorder Mother 71  . Heart disease Father 85  . Heart attack Father   . Lymphoma Sister 72  . Hypertension Sister   . Healthy Brother     ROS- All systems are reviewed and negative except as per the HPI above  Physical Exam: Filed Vitals:   09/22/15 1522  BP: 136/76  Pulse: 54  Height: 5\' 3"  (1.6 m)  Weight: 130 lb 12.8 oz (59.33 kg)    GEN- The patient is well appearing, alert and  oriented x 3 today.   Head- normocephalic, atraumatic Eyes-  Sclera clear, conjunctiva pink Ears- hearing intact Oropharynx- clear Neck- supple, no JVP Lymph- no cervical lymphadenopathy Lungs- Clear to ausculation bilaterally, normal work of breathing Heart- Irregular rate and rhythm, no murmurs, rubs or gallops, PMI not laterally displaced GI- soft, NT, ND, + BS Extremities- no clubbing, cyanosis, or edema MS- no significant deformity or atrophy Skin- no rash or lesion Psych- euthymic mood, full affect Neuro- strength and sensation are intact  EKG- SR at 54 bpm, LAD, Pr int 138 ms, QRS int 80 ms, QTc 445 ms. Echo/stress myoview- see HPI EPic records reviewed  Assessment and Plan:  1. Persistent afib For the most part feels that she is maintaining SR, still some rare weak spells associated with mildly elevated heart rates Continue tikosyn 500 mg bid Can take 1/2 tab of metoprolol 25 mg if elevated heart rate persists   Continue xarelto Bmet/mag today  2. Abnormal CXR Recent chest CT with stable nodule F/u per Dr. [name]  3. Mild ischemia on myoview Clinically no chest pain and LHC is deemed unecessary at this time per Dr. [name]   F/u with Dr. Curt [name] as scheduled 12/20 afib clinic as needed    Vicki [name], [facility] [address] [place], [place] [address] [phone]

## 2015-09-22 NOTE — Patient Instructions (Signed)
Afib clinic as needed

## 2015-09-29 ENCOUNTER — Ambulatory Visit (HOSPITAL_COMMUNITY): Payer: PPO | Admitting: Nurse Practitioner

## 2015-10-06 ENCOUNTER — Ambulatory Visit (HOSPITAL_COMMUNITY): Payer: PPO | Admitting: Nurse Practitioner

## 2015-10-06 ENCOUNTER — Encounter: Payer: Self-pay | Admitting: Internal Medicine

## 2015-10-06 ENCOUNTER — Ambulatory Visit (INDEPENDENT_AMBULATORY_CARE_PROVIDER_SITE_OTHER): Payer: PPO | Admitting: Internal Medicine

## 2015-10-06 VITALS — BP 130/80 | HR 56 | Wt 131.0 lb

## 2015-10-06 DIAGNOSIS — R911 Solitary pulmonary nodule: Secondary | ICD-10-CM

## 2015-10-06 DIAGNOSIS — I481 Persistent atrial fibrillation: Secondary | ICD-10-CM | POA: Diagnosis not present

## 2015-10-06 DIAGNOSIS — K219 Gastro-esophageal reflux disease without esophagitis: Secondary | ICD-10-CM

## 2015-10-06 DIAGNOSIS — I4819 Other persistent atrial fibrillation: Secondary | ICD-10-CM

## 2015-10-06 NOTE — Progress Notes (Signed)
Pre visit review using our clinic review tool, if applicable. No additional management support is needed unless otherwise documented below in the visit note. 

## 2015-10-06 NOTE — Progress Notes (Signed)
Subjective:  Patient ID: Vicki [name], female    DOB: 1946/03/28  Age: 69 y.o. MRN: [id number]  CC: No chief complaint on file.   HPI Abbi Mancini presents for A fib, GERD, dyslipidemia f/u  Outpatient Prescriptions Prior to Visit  Medication Sig Dispense Refill  . cholecalciferol (VITAMIN D) 1000 UNITS tablet Take 1,000 Units by mouth 2 (two) times daily.    Marland Kitchen dofetilide (TIKOSYN) 500 MCG capsule Take 1 capsule (500 mcg total) by mouth 2 (two) times daily. 60 capsule 11  . famotidine (PEPCID) 20 MG tablet Take 1 tablet (20 mg total) by mouth daily. 30 tablet 6  . loratadine (CLARITIN) 10 MG tablet Take 10 mg by mouth daily.    . rivaroxaban (XARELTO) 20 MG TABS tablet Take 1 tablet (20 mg total) by mouth daily. 90 tablet 3  . simvastatin (ZOCOR) 20 MG tablet Take 1 tablet (20 mg total) by mouth at bedtime. 90 tablet 2   No facility-administered medications prior to visit.    ROS Review of Systems  Constitutional: Negative for chills, activity change, appetite change, fatigue and unexpected weight change.  HENT: Negative for congestion, mouth sores and sinus pressure.   Eyes: Negative for visual disturbance.  Respiratory: Negative for cough, chest tightness and shortness of breath.   Gastrointestinal: Negative for nausea and abdominal pain.  Genitourinary: Negative for frequency, difficulty urinating and vaginal pain.  Musculoskeletal: Negative for back pain and gait problem.  Skin: Negative for pallor and rash.  Neurological: Negative for dizziness, tremors, weakness, numbness and headaches.  Psychiatric/Behavioral: Negative for confusion and sleep disturbance.    Objective:  BP 130/80 mmHg  Pulse 56  Wt 131 lb (59.421 kg)  SpO2 97%  BP Readings from Last 3 Encounters:  10/06/15 130/80  09/22/15 136/76  09/15/15 120/70    Wt Readings from Last 3 Encounters:  10/06/15 131 lb (59.421 kg)  09/22/15 130 lb 12.8 oz (59.33 kg)  09/15/15 130 lb 3.2 oz (59.058  kg)    Physical Exam  Constitutional: She appears well-developed. No distress.  HENT:  Head: Normocephalic.  Right Ear: External ear normal.  Left Ear: External ear normal.  Nose: Nose normal.  Mouth/Throat: Oropharynx is clear and moist.  Eyes: Conjunctivae are normal. Pupils are equal, round, and reactive to light. Right eye exhibits no discharge. Left eye exhibits no discharge.  Neck: Normal range of motion. Neck supple. No JVD present. No tracheal deviation present. No thyromegaly present.  Cardiovascular: Normal rate, regular rhythm and normal heart sounds.   Pulmonary/Chest: No stridor. No respiratory distress. She has no wheezes.  Abdominal: Soft. Bowel sounds are normal. She exhibits no distension and no mass. There is no tenderness. There is no rebound and no guarding.  Musculoskeletal: She exhibits no edema or tenderness.  Lymphadenopathy:    She has no cervical adenopathy.  Neurological: She displays normal reflexes. No cranial nerve deficit. She exhibits normal muscle tone. Coordination normal.  Skin: No rash noted. No erythema.  Psychiatric: She has a normal mood and affect. Her behavior is normal. Judgment and thought content normal.    Lab Results  Component Value Date   WBC 6.2 08/07/2015   HGB 12.6 08/07/2015   HCT 37.4 08/07/2015   PLT 296 08/07/2015   GLUCOSE 85 09/22/2015   CHOL 167 07/04/2015   TRIG 117.0 07/04/2015   HDL 65.50 07/04/2015   LDLCALC 78 07/04/2015   ALT 14 08/07/2015   AST 18 08/07/2015   NA 137  09/22/2015   K 4.4 09/22/2015   CL 102 09/22/2015   CREATININE 0.70 09/22/2015   BUN 13 09/22/2015   CO2 27 09/22/2015   TSH 1.733 08/07/2015   INR 1.41 08/07/2015    No results found.  Assessment & Plan:   There are no diagnoses linked to this encounter. I am having Ms. Maes maintain her loratadine, simvastatin, cholecalciferol, famotidine, dofetilide, and rivaroxaban.  No orders of the defined types were placed in this encounter.       Follow-up: No Follow-up on file.  Walker Kehr, MD

## 2015-10-06 NOTE — Assessment & Plan Note (Signed)
Dr Gwenlyn Found. On Vicki [name] and Vicki [name]

## 2015-10-06 NOTE — Assessment & Plan Note (Signed)
Per Dr Gwenlyn Found - repeat in 6 mo

## 2015-10-06 NOTE — Assessment & Plan Note (Signed)
On Pepcid 

## 2015-10-10 ENCOUNTER — Ambulatory Visit: Payer: PPO | Admitting: Internal Medicine

## 2015-11-25 ENCOUNTER — Ambulatory Visit (INDEPENDENT_AMBULATORY_CARE_PROVIDER_SITE_OTHER): Payer: PPO | Admitting: Cardiology

## 2015-11-25 VITALS — BP 138/78 | HR 56 | Ht 63.0 in | Wt 130.2 lb

## 2015-11-25 DIAGNOSIS — I481 Persistent atrial fibrillation: Secondary | ICD-10-CM | POA: Diagnosis not present

## 2015-11-25 DIAGNOSIS — I4819 Other persistent atrial fibrillation: Secondary | ICD-10-CM

## 2015-11-25 IMAGING — CR DG CHEST 2V
2 series · 2 of 2 positions shown · non-contrast
Comparison: PA and lateral chest x-ray February 11, 2012

CLINICAL DATA: Preoperative exam prior to cardioversion, history of
shortness of breath, aortic insufficiency, and breast malignancy

EXAM:
CHEST  2 VIEW

[w chest pa]
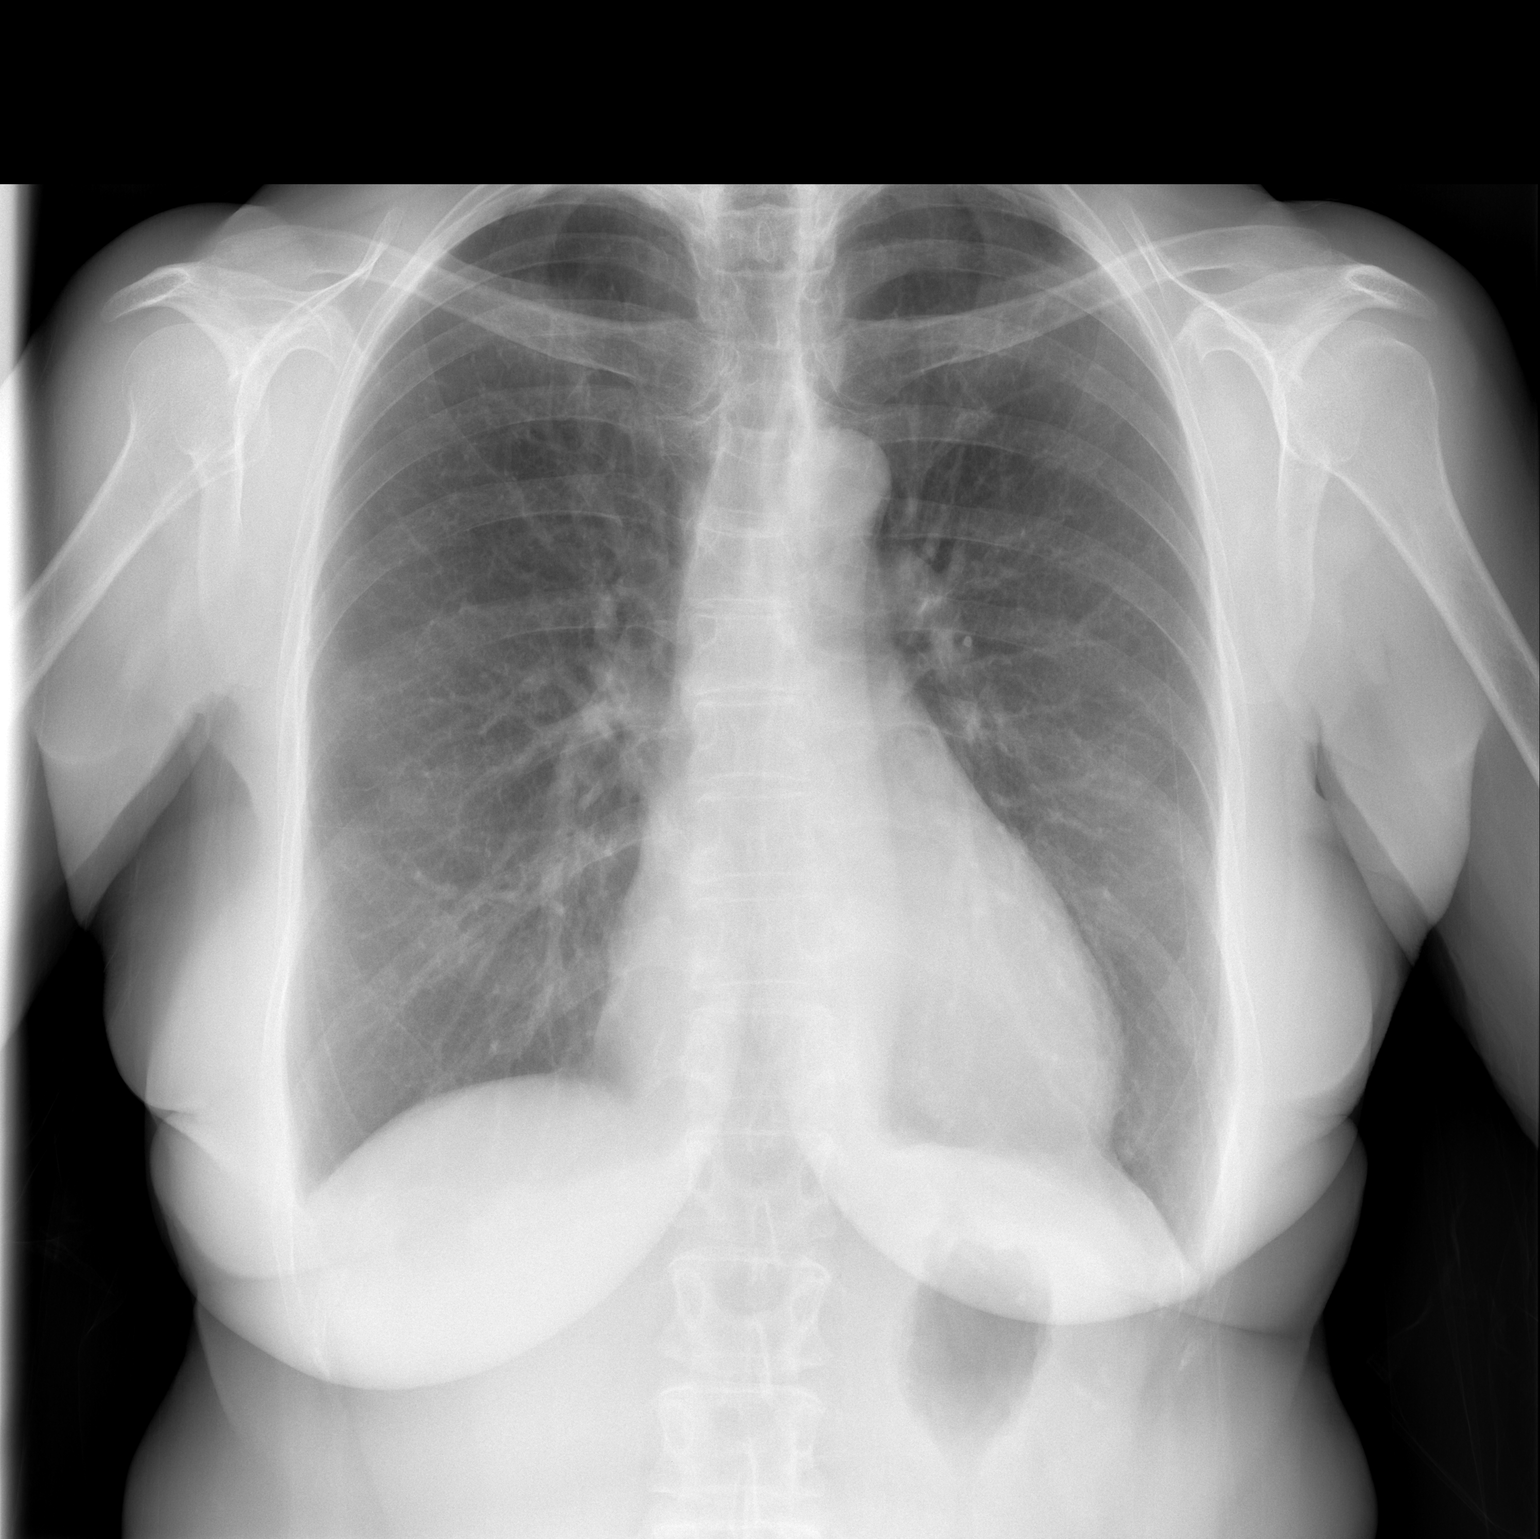

[w chest lat]
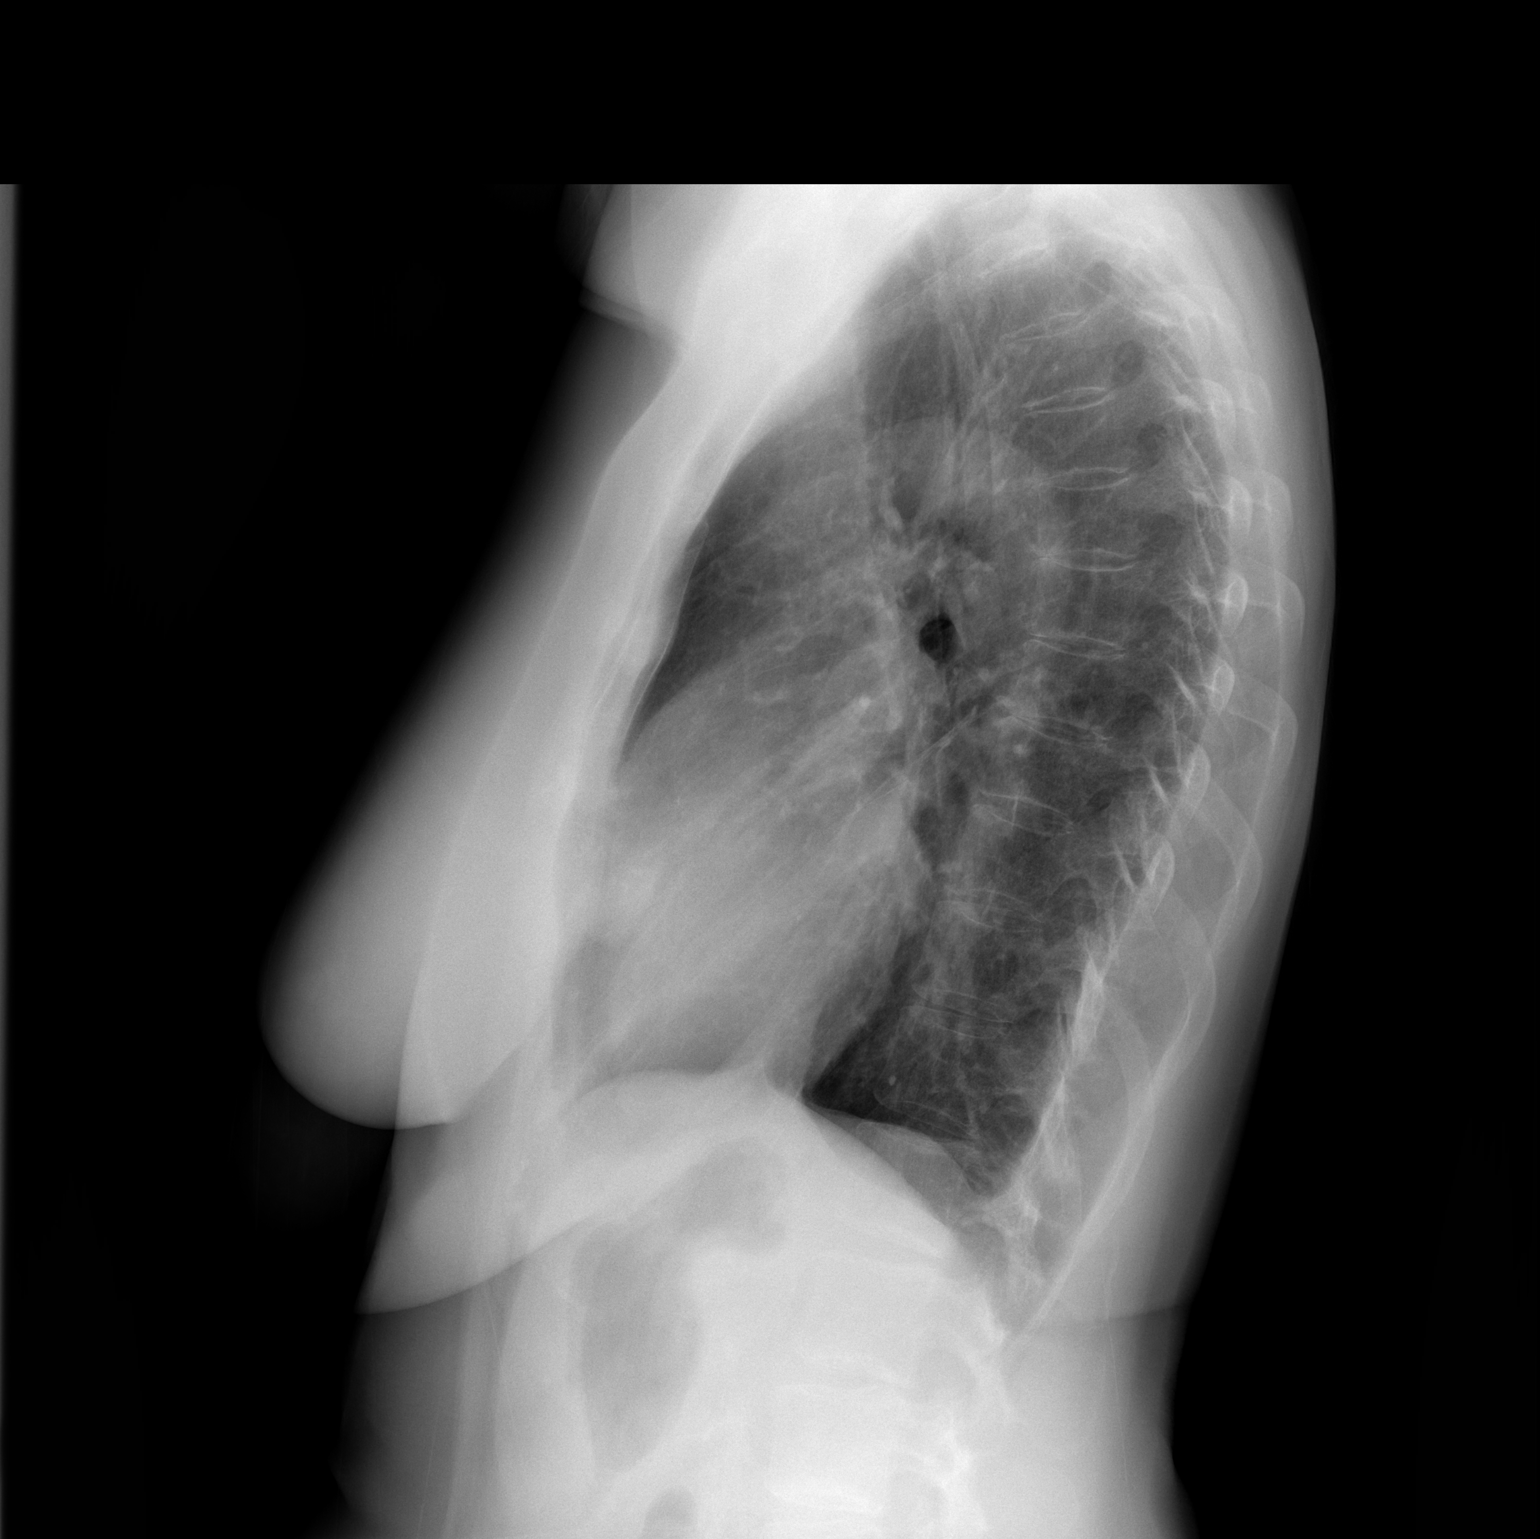

[2 of 2 positions shown; findings below may reference images not displayed]

FINDINGS: The lungs are well-expanded. On the lateral radiograph in the lower
retrosternal region there is subtle nodular density. This cannot be
triangulated on the frontal image. The heart and pulmonary
vascularity are normal. The mediastinum is normal in width. There is
no pleural effusion. The bony thorax exhibits no acute abnormality.
IMPRESSION: 1. Subtle increased density in the lower retro sternal region on the
lateral image only. A repeat lateral film is recommended. If the
finding persists, chest CT scanning would be a useful next imaging
step.
2. There is no CHF nor other acute cardiopulmonary abnormality.

## 2015-11-25 NOTE — Patient Instructions (Signed)
Medication Instructions:  Your physician recommends that you continue on your current medications as directed. Please refer to the Current Medication list given to you today.  Labwork: None ordered  Testing/Procedures: None ordered  Follow-Up: Your physician wants you to follow-up in: 6 months with Roderic Palau, NP in the AFib clinic. You will receive a reminder letter in the mail two months in advance. If you don't receive a letter, please call our office to schedule the follow-up appointment.  Your physician wants you to follow-up in: 1 year with Dr. Curt Bears.  You will receive a reminder letter in the mail two months in advance. If you don't receive a letter, please call our office to schedule the follow-up appointment.  If you need a refill on your cardiac medications before your next appointment, please call your pharmacy.  Thank you for choosing [facility]!!   Trinidad Curet, RN [phone]

## 2015-11-25 NOTE — Progress Notes (Signed)
Electrophysiology Office Note   Date:  11/25/2015   ID:  Kaitlinn, Piechowski 01-15-1946, MRN [id number]  PCP:  Walker Kehr, MD  Cardiologist:  Quay Burow Primary Electrophysiologist: Constance Haw, MD    Chief Complaint  Patient presents with  . Tikosyn loading follow up     History of Present Illness: Teah Arment is a 69 y.o. female who presents today for electrophysiology evaluation.   She has a history of persistent atrial fibrillation, hyperlipidemia, and aortic insufficiency. She is on Xarelto for [facility] of 2. She was admitted to the hospital in September for a tedious load and has been on that ever since.   Today, she denies symptoms of palpitations, chest pain, shortness of breath, orthopnea, PND, lower extremity edema, claudication, dizziness, presyncope, syncope, bleeding, or neurologic sequela. The patient is tolerating medications without difficulties and is otherwise without complaint today.    Past Medical History  Diagnosis Date  . Allergic rhinitis   . Aortic insufficiency   . Paroxysmal atrial fibrillation (HCC)     A. fib is now persistent.  . Hyperlipidemia   . GERD (gastroesophageal reflux disease)   . Diverticulosis of colon   . Hx of colonic polyps   . Adenocarcinoma, breast ([place])   . DJD (degenerative joint disease)   . Osteopenia   . Anxiety   . Contact dermatitis   . Visit for monitoring Tikosyn therapy 09/06/2015   Past Surgical History  Procedure Laterality Date  . Abdominal hysterectomy  1984    with 1 ovary removal  . Breast lumpectomy Left 08/2007     with node bx     Current Outpatient Prescriptions  Medication Sig Dispense Refill  . cholecalciferol (VITAMIN D) 1000 UNITS tablet Take 1,000 Units by mouth 2 (two) times daily.    Marland Kitchen dofetilide (TIKOSYN) 500 MCG capsule Take 1 capsule (500 mcg total) by mouth 2 (two) times daily. 60 capsule 11  . famotidine (PEPCID) 20 MG tablet Take 1 tablet (20 mg total) by  mouth daily. 30 tablet 6  . loratadine (CLARITIN) 10 MG tablet Take 10 mg by mouth daily.    . rivaroxaban (XARELTO) 20 MG TABS tablet Take 1 tablet (20 mg total) by mouth daily. 90 tablet 3  . simvastatin (ZOCOR) 20 MG tablet Take 1 tablet (20 mg total) by mouth at bedtime. 90 tablet 2   No current facility-administered medications for this visit.    Allergies:   Codeine   Social History:  The patient  reports that she quit smoking about 38 years ago. Her smoking use included Cigarettes. She has never used smokeless tobacco. She reports that she does not drink alcohol or use illicit drugs.   Family History:  The patient's family history includes Clotting disorder (age of onset: 70) in her mother; Healthy in her brother; Heart attack in her father; Heart disease in her mother; Heart disease (age of onset: 61) in her father; Hypertension in her sister; Lymphoma (age of onset: 64) in her sister; Other (age of onset: 39) in her mother.    ROS:  Please see the history of present illness.   Otherwise, review of systems is positive for visual changes in her left eye.   All other systems are reviewed and negative.    PHYSICAL EXAM: VS:  BP 138/78 mmHg  Pulse 56  Ht 5\' 3"  (1.6 m)  Wt 130 lb 3.2 oz (59.058 kg)  BMI 23.07 kg/m2 , BMI Body mass index is  23.07 kg/(m^2). GEN: Well nourished, well developed, in no acute distress HEENT: normal Neck: no JVD, carotid bruits, or masses Cardiac: RRR; no murmurs, rubs, or gallops,no edema  Respiratory:  clear to auscultation bilaterally, normal work of breathing GI: soft, nontender, nondistended, + BS MS: no deformity or atrophy Skin: warm and dry Neuro:  Strength and sensation are intact Psych: euthymic mood, full affect  EKG:  EKG is ordered today. The ekg ordered today shows sinus rhythm, rate 53, QTc 429  Recent Labs: 08/07/2015: ALT 14; Hemoglobin 12.6; Platelets 296; TSH 1.733 09/22/2015: BUN 13; Creatinine, Ser 0.70; Magnesium 2.3; Potassium  4.4; Sodium 137    Lipid Panel     Component Value Date/Time   CHOL 167 07/04/2015 0956   TRIG 117.0 07/04/2015 0956   HDL 65.50 07/04/2015 0956   CHOLHDL 3 07/04/2015 0956   VLDL 23.4 07/04/2015 0956   LDLCALC 78 07/04/2015 0956     Wt Readings from Last 3 Encounters:  11/25/15 130 lb 3.2 oz (59.058 kg)  10/06/15 131 lb (59.421 kg)  09/22/15 130 lb 12.8 oz (59.33 kg)      Other studies Reviewed: Additional studies/ records that were reviewed today include: 07/09/15 TTE Review of the above records today demonstrates: - Normal LV function; elevated LV filling pressure; mild AI; thickened MV with prolapse of the anterior leaflet; eccentric, posteriorly directed MR difficult to quantitate but appears to be mild (may be underestimated due to eccentric nature); mild TR, mildly elevated pulmonary pressures.    ASSESSMENT AND PLAN:  1.   PERSISTENT ATRIAL FIBRILLATION:  Patient was loaded on decreasing in September and has felt well since that time. She says that she has not noted any further episodes of atrial fibrillation. Today her EKG is unchanged. We Loreda Silverio continue her  Dofetilide without any adjustments. She is also on Zaroxolyn for a chads 2 vasc of 2.    Current medicines are reviewed at length with the patient today.   The patient does not have concerns regarding her medicines.  The following changes were made today:  none  Labs/ tests ordered today include:  Orders Placed This Encounter  Procedures  . EKG 12-Lead     Disposition:   FU with Doristine Devoid  6 months  Signed, Takeia Ciaravino Meredith Leeds, MD  11/25/2015 8:47 AM     [facility] [address] [place] [address] [phone] (office) [phone] (fax)

## 2015-12-12 ENCOUNTER — Encounter (INDEPENDENT_AMBULATORY_CARE_PROVIDER_SITE_OTHER): Payer: PPO | Admitting: Ophthalmology

## 2015-12-12 DIAGNOSIS — H2513 Age-related nuclear cataract, bilateral: Secondary | ICD-10-CM

## 2015-12-12 DIAGNOSIS — H353221 Exudative age-related macular degeneration, left eye, with active choroidal neovascularization: Secondary | ICD-10-CM

## 2015-12-12 DIAGNOSIS — H43813 Vitreous degeneration, bilateral: Secondary | ICD-10-CM

## 2015-12-12 DIAGNOSIS — H353111 Nonexudative age-related macular degeneration, right eye, early dry stage: Secondary | ICD-10-CM | POA: Diagnosis not present

## 2015-12-30 ENCOUNTER — Encounter: Payer: Self-pay | Admitting: Cardiovascular Disease

## 2015-12-30 ENCOUNTER — Ambulatory Visit (INDEPENDENT_AMBULATORY_CARE_PROVIDER_SITE_OTHER): Payer: PPO | Admitting: Cardiovascular Disease

## 2015-12-30 VITALS — BP 132/72 | HR 57 | Ht 63.0 in | Wt 128.0 lb

## 2015-12-30 DIAGNOSIS — R911 Solitary pulmonary nodule: Secondary | ICD-10-CM | POA: Diagnosis not present

## 2015-12-30 DIAGNOSIS — E785 Hyperlipidemia, unspecified: Secondary | ICD-10-CM

## 2015-12-30 DIAGNOSIS — I480 Paroxysmal atrial fibrillation: Secondary | ICD-10-CM | POA: Diagnosis not present

## 2015-12-30 NOTE — Assessment & Plan Note (Signed)
History of dyslipidemia on simvastatin with recent lipid profile performed 07/04/15 revealed a total cholesterol 67, LDL 70 HDL of 65

## 2015-12-30 NOTE — Assessment & Plan Note (Signed)
History of paroxysmal atrial fibrillation maintaining sinus rhythm on Xarelto  And Tikosyn followed by Dr. Curt Bears and our atrial fibrillation clinic Roderic Palau)

## 2015-12-30 NOTE — Patient Instructions (Signed)

## 2015-12-30 NOTE — Progress Notes (Signed)
12/30/2015 Vicki [name]   1946-01-10  [id number]  Primary Physician Vicki Kehr, MD Primary Cardiologist: Lorretta Harp MD Vicki [name]   HPI:  Vicki [name] is a 70 year old thin-appearing Caucasian female mother of one, grandmother and 6 grandchildren referred by Vicki [name] for cardiovascular evaluation because of newly recognized atrial fibrillation.I last saw her in the office 07/14/15. She works in [employer]. She basically has no chronic risk factors other than hyperlipidemia and family history. Both her mother and father had ischemic heart disease. She has never had a heart attack or stroke. She did poorly have a total fibrillation briefly a +5 years ago and was treated with a low-dose beta blocker which she remains on. She does complain of occasional dyspnea. She saw her primary care physician who noted an irregular heart rhythm on exam and got an EKG that showed atrial fibrillation with a controlled ventricular response. He placed her on an oral anticoagulant (Xarelto). The CHA2DSVASC2 score is 2 . A subsequent 2-D echo was normal and a Myoview was low risk with a small area of anteroapical ischemia although she otherwise is asymptomatic. I was going to cardiovert her but she spontaneously converted to sinus rhythm. She was brought in the hospital in September by Vicki [name] and had a dofetilide  load and last saw Vicki [name] 11/25/15 as well as being followed in our A. Fib clinic. She is maintaining sinus rhythm.   Current Outpatient Prescriptions  Medication Sig Dispense Refill  . cholecalciferol (VITAMIN D) 1000 UNITS tablet Take 1,000 Units by mouth 2 (two) times daily.    Marland Kitchen dofetilide (TIKOSYN) 500 MCG capsule Take 1 capsule (500 mcg total) by mouth 2 (two) times daily. 60 capsule 11  . famotidine (PEPCID) 20 MG tablet Take 1 tablet (20 mg total) by mouth daily. 30 tablet 6  . loratadine (CLARITIN) 10 MG tablet Take 10 mg by mouth daily.    . Multiple  Vitamins-Minerals (PRESERVISION AREDS PO) Take 1 tablet by mouth 2 (two) times daily.    . rivaroxaban (XARELTO) 20 MG TABS tablet Take 1 tablet (20 mg total) by mouth daily. 90 tablet 3  . simvastatin (ZOCOR) 20 MG tablet Take 1 tablet (20 mg total) by mouth at bedtime. 90 tablet 2   No current facility-administered medications for this visit.    Allergies  Allergen Reactions  . Codeine     REACTION: unknown    Social History   Social History  . Marital Status: Married    Spouse Name: N/A  . Number of Children: 1  . Years of Education: N/A   Occupational History  . [place]   Social History Main Topics  . Smoking status: Former Smoker    Types: Cigarettes    Quit date: 12/06/1976  . Smokeless tobacco: Never Used  . Alcohol Use: No  . Drug Use: No  . Sexual Activity: Not on file   Other Topics Concern  . Not on file   Social History Narrative     Review of Systems: General: negative for chills, fever, night sweats or weight changes.  Cardiovascular: negative for chest pain, dyspnea on exertion, edema, orthopnea, palpitations, paroxysmal nocturnal dyspnea or shortness of breath Dermatological: negative for rash Respiratory: negative for cough or wheezing Urologic: negative for hematuria Abdominal: negative for nausea, vomiting, diarrhea, bright red blood per rectum, melena, or hematemesis Neurologic: negative for visual changes, syncope, or dizziness All other systems reviewed and are otherwise negative except as noted  above.    Blood pressure 132/72, pulse 57, height 5\' 3"  (1.6 m), weight 128 lb (58.06 kg).  General appearance: alert and no distress Neck: no adenopathy, no carotid bruit, no JVD, supple, symmetrical, trachea midline and thyroid not enlarged, symmetric, no tenderness/mass/nodules Lungs: clear to auscultation bilaterally Heart: regular rate and rhythm, S1, S2 normal, no murmur, click, rub or gallop Extremities: extremities normal,  atraumatic, no cyanosis or edema  EKG not performed today  ASSESSMENT AND PLAN:   Dyslipidemia History of dyslipidemia on simvastatin with recent lipid profile performed 07/04/15 revealed a total cholesterol 67, LDL 70 HDL of 65  Solitary pulmonary nodule History of a 2 mm right upper lobe nodule on CT scan 08/21/15. Recommendations were to repeat this 6 months from that study in a patient with breast cancer. Vicki [name]  the cough will follow this up.  PAROXYSMAL ATRIAL FIBRILLATION History of paroxysmal atrial fibrillation maintaining sinus rhythm on Xarelto  And Tikosyn followed by Vicki [name] and our atrial fibrillation clinic Roderic Palau)      Lorretta Harp MD [facility], [facility] 12/30/2015 11:55 AM

## 2015-12-30 NOTE — Assessment & Plan Note (Addendum)
History of a 2 mm right upper lobe nodule on CT scan 08/21/15. Recommendations were to repeat this 6 months from that study in a patient with breast cancer. Dr. Clarise Cruz  the cough will follow this up.

## 2016-01-01 ENCOUNTER — Other Ambulatory Visit: Payer: Self-pay | Admitting: Internal Medicine

## 2016-01-01 DIAGNOSIS — R911 Solitary pulmonary nodule: Secondary | ICD-10-CM

## 2016-01-09 ENCOUNTER — Encounter (INDEPENDENT_AMBULATORY_CARE_PROVIDER_SITE_OTHER): Payer: PPO | Admitting: Ophthalmology

## 2016-01-09 DIAGNOSIS — H2513 Age-related nuclear cataract, bilateral: Secondary | ICD-10-CM | POA: Diagnosis not present

## 2016-01-09 DIAGNOSIS — H353111 Nonexudative age-related macular degeneration, right eye, early dry stage: Secondary | ICD-10-CM | POA: Diagnosis not present

## 2016-01-09 DIAGNOSIS — H353221 Exudative age-related macular degeneration, left eye, with active choroidal neovascularization: Secondary | ICD-10-CM | POA: Diagnosis not present

## 2016-01-09 DIAGNOSIS — H43813 Vitreous degeneration, bilateral: Secondary | ICD-10-CM

## 2016-01-28 ENCOUNTER — Ambulatory Visit (INDEPENDENT_AMBULATORY_CARE_PROVIDER_SITE_OTHER)
Admission: RE | Admit: 2016-01-28 | Discharge: 2016-01-28 | Disposition: A | Discharge status: Home / Self Care | Payer: PPO | Source: Ambulatory Visit | Attending: Internal Medicine | Admitting: Internal Medicine

## 2016-01-28 DIAGNOSIS — R911 Solitary pulmonary nodule: Secondary | ICD-10-CM

## 2016-02-06 ENCOUNTER — Encounter (INDEPENDENT_AMBULATORY_CARE_PROVIDER_SITE_OTHER): Payer: PPO | Admitting: Ophthalmology

## 2016-02-06 DIAGNOSIS — H353111 Nonexudative age-related macular degeneration, right eye, early dry stage: Secondary | ICD-10-CM

## 2016-02-06 DIAGNOSIS — H353221 Exudative age-related macular degeneration, left eye, with active choroidal neovascularization: Secondary | ICD-10-CM | POA: Diagnosis not present

## 2016-02-06 DIAGNOSIS — H43813 Vitreous degeneration, bilateral: Secondary | ICD-10-CM | POA: Diagnosis not present

## 2016-02-06 DIAGNOSIS — H2513 Age-related nuclear cataract, bilateral: Secondary | ICD-10-CM

## 2016-02-06 DIAGNOSIS — H35033 Hypertensive retinopathy, bilateral: Secondary | ICD-10-CM

## 2016-02-06 DIAGNOSIS — I10 Essential (primary) hypertension: Secondary | ICD-10-CM

## 2016-03-08 ENCOUNTER — Encounter (INDEPENDENT_AMBULATORY_CARE_PROVIDER_SITE_OTHER): Payer: PPO | Admitting: Ophthalmology

## 2016-03-08 DIAGNOSIS — H2513 Age-related nuclear cataract, bilateral: Secondary | ICD-10-CM

## 2016-03-08 DIAGNOSIS — H35033 Hypertensive retinopathy, bilateral: Secondary | ICD-10-CM

## 2016-03-08 DIAGNOSIS — H43813 Vitreous degeneration, bilateral: Secondary | ICD-10-CM

## 2016-03-08 DIAGNOSIS — H353111 Nonexudative age-related macular degeneration, right eye, early dry stage: Secondary | ICD-10-CM

## 2016-03-08 DIAGNOSIS — I10 Essential (primary) hypertension: Secondary | ICD-10-CM | POA: Diagnosis not present

## 2016-03-08 DIAGNOSIS — H353221 Exudative age-related macular degeneration, left eye, with active choroidal neovascularization: Secondary | ICD-10-CM

## 2016-03-24 ENCOUNTER — Other Ambulatory Visit: Payer: Self-pay | Admitting: Cardiology

## 2016-03-24 ENCOUNTER — Other Ambulatory Visit: Payer: Self-pay | Admitting: Internal Medicine

## 2016-03-24 NOTE — Telephone Encounter (Signed)
Rx request sent to pharmacy.  

## 2016-04-02 ENCOUNTER — Ambulatory Visit (INDEPENDENT_AMBULATORY_CARE_PROVIDER_SITE_OTHER): Payer: PPO

## 2016-04-02 VITALS — BP 120/70 | HR 54 | Ht 63.0 in | Wt 128.2 lb

## 2016-04-02 DIAGNOSIS — Z7289 Other problems related to lifestyle: Secondary | ICD-10-CM | POA: Diagnosis not present

## 2016-04-02 DIAGNOSIS — Z0000 Encounter for general adult medical examination without abnormal findings: Secondary | ICD-10-CM

## 2016-04-02 NOTE — Progress Notes (Addendum)
Subjective:   Vicki [name] is a 70 y.o. female who presents for Medicare Annual (Subsequent) preventive examination.  Review of Systems:  HRA assessment completed during visit;   The Patient was informed that this wellness visit is to identify risk and educate on how to reduce risk for increase disease through lifestyle changes.   ROS deferred to CPE exam with physician  Family and medical hx given below;  Mother and father HD; AVR;   One sister and one brother left Sister is 58; htn Sister deceased 87; cancer but not sure what type.  Brother is still living  History:  Osteopenia; did take medicine for awhile; was concerned about side effects and stopped; gets ca in food, takes vit d  Hx adenocarcinoma of the breast; on right; lumpectomy and radiation and no further issues  Hyperlipidemia (07/04/15: cho 167; Trig 117; HDL 65; LDL 78)   Will go to CV center in June; states she is having episodes of feeling like her heart stops; Feels like she is going to pass out; felt like this when she first went on Tikoson; Is concerned her heart may be going out of rhythm; but sensation leaves very quickly;  This occurred last pm; Prior to this, last occurrence was 3 days ago; (2 to 3 times a week) for a couple of months. will outreach Roderic Palau, NP; cardiology today;   Xarelto 20 q day; this past Monday; on vacation; minor nose bleed; right nostril x 2 minutes; put pressure on nostril  and bleeding stopped without issues.  Dr. Alain Marion agreed to outreach cardiology today; ok nasal bleed; the patient was told if the bleeding does not stop within a few minutes or if it appears to get heavier to go to ER.  Prior to leaving, the patient request review of a mole on her back under left shoulder; Irreg border was pink with irreg dk colored center; no drainage; slightly raised.  Does not know if it has changed but recommended she have dermatology look at this; may need to  biopsy. The patient  stated she would fup. To call the office if any difficulty.    Lifestyle review:  Tobacco; 1978 quit date  ETOH None  Medication review/ Adherent Dofetilide (Tikosyn) . Takes regularly  BMI: 22.7 Diet;  Eats vegetables; doesn't eat meat; eats chicken; eat seafood; sometimes friend x once a week; May have roast; sandwiches at lunch; tomato; [place] sandwich;  Side of chips; eats ice cream and has one soft drink per day.  Tries to order the seniors meals Drinks chocolate muscle milk; 100 calories   Exercise;  Goes to the Gym 3 days a week;  Walks on treadmill x 30 minutes and then class for 45 minutes with weights and bands;  90 min 3 days a week;  Lives on cul-de[place]sac; 14 laps is a mile; 30 minutes to walk 14 laps  HOME SAFETY; one level home  Fall x1 on rug in LR: tripped with flip flops; no injuries Age in place? Plans to stay in home Removal of clutter clearing paths through the home,  Does not have a tub; room for chair in shower  Community safety; YES Smoke detectors yes  Firearms safety reviewed and will keep in a safe place if these exist. Driving accidents; no; Wears a seatbelt yes Advised to use sun protection: no; not ususally out in the sun    Depression: Denies feeling depressed or hopeless; voices pleasure in daily life Mood stable;  Cognitive; Presents with no issues;  Engaged in assessment Manages checkbook, medications; no failures of task Ad8 score reviewed for issues;  . Issues making decisions; NO . Less interest in hobbies / activities: NO . Repeats questions, stories; family complaining:NO . Trouble using ordinary gadgets; microwave; computer:NO . Forgets the month or year: NO . Mismanaging finances: NO spouse manages . Missing apt:No . Daily problems with thinking of memory: no  Fall assessment x 1  Does climb due to cabinets being high and has a 2 step stool with handle. Mobilization and Functional losses from last year to this year?  no  Sleep pattern changes; sleeps well; hot flashes at hs;   Urinary or fecal incontinence reviewed/ no   Counseling Health Maintenance Hep c at risk   Colonoscopy; never had one? Had one many years ago;  Educated colo-guard and may consider EKG: 11/25/2015  Mammogram 11/2014/ has one every year; [facility];  Will check on [facility] for report call today at [phone]; agreed to send fax over   Dexa 02/2012 femur -2.3; taking Calcium in food; Vit d and weight bearing exercise   PAP 04/2012/ had a hyst; Dr. Harrington Challenger request annual; discussed the importance of pelvic inspection; still has one ovary and will check this as well.   Hearing: 2000hz  both ears; dicussed hearing screen;   Ophthalmology exam; Dec 2016; since January she has been seeing Dr. Zigmund Daniel;  Stated she wasn't seeing as well out of OS: taking injections now for MD  Immunizations Due: zostavax Asked Dr. Lenna Gilford; didn't recommend it;  Given information to consider; thinks she had one "light" episode of shingles  Advanced Directive; Yes; will bring copy for chart   Health Recommendations and Referrals To fup on mole; Call to leave message to schedule with Dr. Alain Marion for shaved bx.   Current Care Team reviewed and updated         Objective:     Vitals: BP 120/70 mmHg  Pulse 54  Ht 5\' 3"  (1.6 m)  Wt 128 lb 4 oz (58.174 kg)  BMI 22.72 kg/m2  SpO2 97%  Body mass index is 22.72 kg/(m^2).   Tobacco History  Smoking status  . Former Smoker  . Types: Cigarettes  . Quit date: 12/06/1976  Smokeless tobacco  . Never Used    Comment: smoked approx 10 years     Counseling given: Not Answered   Past Medical History  Diagnosis Date  . Allergic rhinitis   . Aortic insufficiency   . Paroxysmal atrial fibrillation (HCC)     A. fib is now persistent.  . Hyperlipidemia   . GERD (gastroesophageal reflux disease)   . Diverticulosis of colon   . Hx of colonic polyps   . Adenocarcinoma, breast ([place])   . DJD  (degenerative joint disease)   . Osteopenia   . Anxiety   . Contact dermatitis   . Visit for monitoring Tikosyn therapy 09/06/2015   Past Surgical History  Procedure Laterality Date  . Abdominal hysterectomy  1984    with 1 ovary removal  . Breast lumpectomy Left 08/2007     with node bx   Family History  Problem Relation Age of Onset  . Other Mother 9    AVR  . Heart disease Mother   . Clotting disorder Mother 49  . Heart disease Father 92  . Heart attack Father   . Lymphoma Sister 33  . Hypertension Sister   . Healthy Brother    History  Sexual Activity  .  Sexual Activity: Not on file    Outpatient Encounter Prescriptions as of 04/02/2016  Medication Sig  . cholecalciferol (VITAMIN D) 1000 UNITS tablet Take 1,000 Units by mouth 2 (two) times daily.  Marland Kitchen dofetilide (TIKOSYN) 500 MCG capsule Take 1 capsule (500 mcg total) by mouth 2 (two) times daily.  . famotidine (PEPCID) 20 MG tablet TAKE ONE TABLET BY MOUTH ONCE DAILY  . loratadine (CLARITIN) 10 MG tablet Take 10 mg by mouth daily.  . Multiple Vitamins-Minerals (PRESERVISION AREDS PO) Take 1 tablet by mouth 2 (two) times daily.  . rivaroxaban (XARELTO) 20 MG TABS tablet Take 1 tablet (20 mg total) by mouth daily.  . simvastatin (ZOCOR) 20 MG tablet TAKE ONE TABLET BY MOUTH AT BEDTIME   No facility-administered encounter medications on file as of 04/02/2016.    Activities of Daily Living In your present state of health, do you have any difficulty performing the following activities: 09/03/2015 09/03/2015  Hearing? N -  Vision? N -  Difficulty concentrating or making decisions? N -  Walking or climbing stairs? N -  Dressing or bathing? N -  Doing errands, shopping? - N    Patient Care Team: Cassandria Anger, MD as PCP - General (Internal Medicine) Lorretta Harp, MD as Consulting Physician (Cardiology)    Assessment:     Exercise Activities and Dietary recommendations    Goals    . Exercise 150 minutes  per week (moderate activity)     Outside the Y; walk 1 mile in the neighborhood 2 to 3 days      Fall Risk Fall Risk  07/04/2015  Falls in the past year? No   Depression Screen PHQ 2/9 Scores 07/04/2015  PHQ - 2 Score 0     Cognitive Testing No flowsheet data found.  Immunization History  Administered Date(s) Administered  . Influenza Split 09/17/2011, 09/21/2012  . Influenza Whole 09/07/2010  . Influenza-Unspecified 09/05/2014, 09/15/2015  . Pneumococcal Conjugate-13 07/04/2015  . Pneumococcal Polysaccharide-23 03/19/2014  . Td 07/04/2015   Screening Tests Health Maintenance  Topic Date Due  . Hepatitis C Screening  November 16, 1946  . COLONOSCOPY  09/11/1996  . ZOSTAVAX  09/11/2006  . INFLUENZA VACCINE  07/06/2016  . MAMMOGRAM  11/20/2016  . TETANUS/TDAP  07/03/2025  . DEXA SCAN  Completed  . PNA vac Low Risk Adult  Completed      Plan:   1. Call to the patient to call and schedule apt with Dr. Alain Marion for "shaved bx" of mole on back; under shoulder;   2. Will call cardiology today to fup on episodes of feeling like she was going to "pass out" which resolved quickly;   3. [facility] called regarding mammogram in 11/2015 and to fax report today  4. Educated regarding the shingles vaccine and will consider.  5 may have hearing screen in the future.   During the course of the visit the patient was educated and counseled about the following appropriate screening and preventive services:   Vaccines to include Pneumoccal, Influenza, Hepatitis B, Td, Zostavax, HCV/ discussed  shingles   Electrocardiogram 11/2015  Cardiovascular Disease/ following cardiology  Colorectal cancer screening; Due; discussed cologuard  Bone density screening 2013; osteopenia and following preventive measures  Diabetes screening/ neg  Glaucoma screening/neg Has MD and rec'ing shots in left eye  Mammography/ [facility] to fax 11/2015 report   Nutrition counseling/ Discussed increasing exercise:  BMI is normal and HDL is good;   Patient Instructions (the written plan) was given to  the patient.   Wynetta Fines, RN  04/02/2016     Medical screening examination/treatment/procedure(s) were performed by non-physician practitioner and as supervising physician I was immediately available for consultation/collaboration. I agree with above. Walker Kehr, MD

## 2016-04-02 NOTE — Patient Instructions (Addendum)
Vicki [name] , Thank you for taking time to come for your Medicare Wellness Visit. I appreciate your ongoing commitment to your health goals. Please review the following plan we discussed and let me know if I can assist you in the future.   These are the goals we discussed: Goals    . Exercise 150 minutes per week (moderate activity)     [place] [facility]; [place] mile in the neighborhood 2 to 3 days       I will check with [facility] for last mammogram reports  May have hearing screen in the future  Educated to check with insurance regarding coverage of Shingles vaccination on Part D or Part B and may have lower co-pay if provided on the Part D side  Important Safety Information ZOSTAVAX does not protect everyone, so some people who get the vaccine may still get Shingles.  You should not get ZOSTAVAX if you are allergic to any of its ingredients, including gelatin or neomycin, have a weakened immune system, take high doses of steroids, or are pregnant or plan to become pregnant. You should not get ZOSTAVAX to prevent chickenpox.  Talk to your health care professional if you plan to get ZOSTAVAX at the same time as PNEUMOVAX23 (Pneumococcal Vaccine Polyvalent) because it may be better to get these vaccines at least 4 weeks apart.  Possible side effects include redness, pain, itching, swelling, hard lump, warmth, or bruising at the injection site, as well as headache.  ZOSTAVAX contains a weakened chickenpox virus. Tell your health care professional if you will be in close contact with newborn infants, someone who may be pregnant and has not had chickenpox or been vaccinated against chickenpox, or someone who has problems with their immune system. Your health care professional can tell you what situations you may need to avoid. You are encouraged to report negative side effects of prescription drugs to the FDA. Visit [employer], or call 1-800-FDA-1088.  Go to the [facility].gov to learn  more   This is a list of the screening recommended for you and due dates:  Health Maintenance  Topic Date Due  .  Hepatitis C: One time screening is recommended by Center for Disease Control  (CDC) for  adults born from [birth year] through 1965.   1946-04-13  . Colon Cancer Screening  09/11/1996  . Shingles Vaccine  09/11/2006  . Flu Shot  07/06/2016  . Mammogram  11/20/2016  . Tetanus Vaccine  07/03/2025  . DEXA scan (bone density measurement)  Completed  . Pneumonia vaccines  Completed     Fall Prevention in the Home  Falls can cause injuries. They can happen to people of all ages. There are many things you can do to make your home safe and to help prevent falls.  WHAT CAN I DO ON THE OUTSIDE OF MY HOME?  Regularly fix the edges of walkways and driveways and fix any cracks.  Remove anything that might make you trip as you walk through a door, such as a raised step or threshold.  Trim any bushes or trees on the path to your home.  Use bright outdoor lighting.  Clear any walking paths of anything that might make someone trip, such as rocks or tools.  Regularly check to see if handrails are loose or broken. Make sure that both sides of any steps have handrails.  Any raised decks and porches should have guardrails on the edges.  Have any leaves, snow, or ice cleared regularly.  Use  sand or salt on walking paths during winter.  Clean up any spills in your garage right away. This includes oil or grease spills. WHAT CAN I DO IN THE BATHROOM?   Use night lights.  Install grab bars by the toilet and in the tub and shower. Do not use towel bars as grab bars.  Use non-skid mats or decals in the tub or shower.  If you need to sit down in the shower, use a plastic, non-slip stool.  Keep the floor dry. Clean up any water that spills on the floor as soon as it happens.  Remove soap buildup in the tub or shower regularly.  Attach bath mats securely with double-sided non-slip rug  tape.  Do not have throw rugs and other things on the floor that can make you trip. WHAT CAN I DO IN THE BEDROOM?  Use night lights.  Make sure that you have a light by your bed that is easy to reach.  Do not use any sheets or blankets that are too big for your bed. They should not hang down onto the floor.  Have a firm chair that has side arms. You can use this for support while you get dressed.  Do not have throw rugs and other things on the floor that can make you trip. WHAT CAN I DO IN THE KITCHEN?  Clean up any spills right away.  Avoid walking on wet floors.  Keep items that you use a lot in easy-to-reach places.  If you need to reach something above you, use a strong step stool that has a grab bar.  Keep electrical cords out of the way.  Do not use floor polish or wax that makes floors slippery. If you must use wax, use non-skid floor wax.  Do not have throw rugs and other things on the floor that can make you trip. WHAT CAN I DO WITH MY STAIRS?  Do not leave any items on the stairs.  Make sure that there are handrails on both sides of the stairs and use them. Fix handrails that are broken or loose. Make sure that handrails are as long as the stairways.  Check any carpeting to make sure that it is firmly attached to the stairs. Fix any carpet that is loose or worn.  Avoid having throw rugs at the top or bottom of the stairs. If you do have throw rugs, attach them to the floor with carpet tape.  Make sure that you have a light switch at the top of the stairs and the bottom of the stairs. If you do not have them, ask someone to add them for you. WHAT ELSE CAN I DO TO HELP PREVENT FALLS?  Wear shoes that:  Do not have high heels.  Have rubber bottoms.  Are comfortable and fit you well.  Are closed at the toe. Do not wear sandals.  If you use a stepladder:  Make sure that it is fully opened. Do not climb a closed stepladder.  Make sure that both sides of the  stepladder are locked into place.  Ask someone to hold it for you, if possible.  Clearly mark and make sure that you can see:  Any grab bars or handrails.  First and last steps.  Where the edge of each step is.  Use tools that help you move around (mobility aids) if they are needed. These include:  Canes.  Walkers.  Scooters.  Crutches.  Turn on the lights when you go  into a dark area. Replace any light bulbs as soon as they burn out.  Set up your furniture so you have a clear path. Avoid moving your furniture around.  If any of your floors are uneven, fix them.  If there are any pets around you, be aware of where they are.  Review your medicines with your doctor. Some medicines can make you feel dizzy. This can increase your chance of falling. Ask your doctor what other things that you can do to help prevent falls.   This information is not intended to replace advice given to you by your health care provider. Make sure you discuss any questions you have with your health care provider.   Document Released: 09/18/2009 Document Revised: 04/08/2015 Document Reviewed: 12/27/2014 Elsevier Interactive Patient Education 2016 [employer] Maintenance, Female Adopting a healthy lifestyle and getting preventive care can go a long way to promote health and wellness. Talk with your health care provider about what schedule of regular examinations is right for you. This is a good chance for you to check in with your provider about disease prevention and staying healthy. In between checkups, there are plenty of things you can do on your own. Experts have done a lot of research about which lifestyle changes and preventive measures are most likely to keep you healthy. Ask your health care provider for more information. WEIGHT AND DIET  Eat a healthy diet  Be sure to include plenty of vegetables, fruits, low-fat dairy products, and lean protein.  Do not eat a lot of foods high  in solid fats, added sugars, or salt.  Get regular exercise. This is one of the most important things you can do for your health.  Most adults should exercise for at least 150 minutes each week. The exercise should increase your heart rate and make you sweat (moderate-intensity exercise).  Most adults should also do strengthening exercises at least twice a week. This is in addition to the moderate-intensity exercise.  Maintain a healthy weight  Body mass index (BMI) is a measurement that can be used to identify possible weight problems. It estimates body fat based on height and weight. Your health care provider can help determine your BMI and help you achieve or maintain a healthy weight.  For females 35 years of age and older:   A BMI below 18.5 is considered underweight.  A BMI of 18.5 to 24.9 is normal.  A BMI of 25 to 29.9 is considered overweight.  A BMI of 30 and above is considered obese.  Watch levels of cholesterol and blood lipids  You should start having your blood tested for lipids and cholesterol at 70 years of age, then have this test every 5 years.  You may need to have your cholesterol levels checked more often if:  Your lipid or cholesterol levels are high.  You are older than 70 years of age.  You are at high risk for heart disease.  CANCER SCREENING   Lung Cancer  Lung cancer screening is recommended for adults 13-67 years old who are at high risk for lung cancer because of a history of smoking.  A yearly low-dose CT scan of the lungs is recommended for people who:  Currently smoke.  Have quit within the past 15 years.  Have at least a 30-pack-year history of smoking. A pack year is smoking an average of one pack of cigarettes a day for 1 year.  Yearly screening should continue until it has  been 15 years since you quit.  Yearly screening should stop if you develop a health problem that would prevent you from having lung cancer treatment.   Breast Cancer  Practice breast self-awareness. This means understanding how your breasts normally appear and feel.  It also means doing regular breast self-exams. Let your health care provider know about any changes, no matter how small.  If you are in your 20s or 30s, you should have a clinical breast exam (CBE) by a health care provider every 1-3 years as part of a regular health exam.  If you are 58 or older, have a CBE every year. Also consider having a breast X-ray (mammogram) every year.  If you have a family history of breast cancer, talk to your health care provider about genetic screening.  If you are at high risk for breast cancer, talk to your health care provider about having an MRI and a mammogram every year.  Breast cancer gene (BRCA) assessment is recommended for women who have family members with BRCA-related cancers. BRCA-related cancers include:  Breast.  Ovarian.  Tubal.  Peritoneal cancers.  Results of the assessment will determine the need for genetic counseling and BRCA1 and BRCA2 testing. Cervical Cancer Your health care provider may recommend that you be screened regularly for cancer of the pelvic organs (ovaries, uterus, and vagina). This screening involves a pelvic examination, including checking for microscopic changes to the surface of your cervix (Pap test). You may be encouraged to have this screening done every 3 years, beginning at age 12.  For women ages 89-65, health care providers may recommend pelvic exams and Pap testing every 3 years, or they may recommend the Pap and pelvic exam, combined with testing for human papilloma virus (HPV), every 5 years. Some types of HPV increase your risk of cervical cancer. Testing for HPV may also be done on women of any age with unclear Pap test results.  Other health care providers may not recommend any screening for nonpregnant women who are considered low risk for pelvic cancer and who do not have symptoms.  Ask your health care provider if a screening pelvic exam is right for you.  If you have had past treatment for cervical cancer or a condition that could lead to cancer, you need Pap tests and screening for cancer for at least 20 years after your treatment. If Pap tests have been discontinued, your risk factors (such as having a new sexual partner) need to be reassessed to determine if screening should resume. Some women have medical problems that increase the chance of getting cervical cancer. In these cases, your health care provider may recommend more frequent screening and Pap tests. Colorectal Cancer  This type of cancer can be detected and often prevented.  Routine colorectal cancer screening usually begins at 70 years of age and continues through 70 years of age.  Your health care provider may recommend screening at an earlier age if you have risk factors for colon cancer.  Your health care provider may also recommend using home test kits to check for hidden blood in the stool.  A small camera at the end of a tube can be used to examine your colon directly (sigmoidoscopy or colonoscopy). This is done to check for the earliest forms of colorectal cancer.  Routine screening usually begins at age 38.  Direct examination of the colon should be repeated every 5-10 years through 70 years of age. However, you may need to be screened more often  if early forms of precancerous polyps or small growths are found. Skin Cancer  Check your skin from head to toe regularly.  Tell your health care provider about any new moles or changes in moles, especially if there is a change in a mole's shape or color.  Also tell your health care provider if you have a mole that is larger than the size of a pencil eraser.  Always use sunscreen. Apply sunscreen liberally and repeatedly throughout the day.  Protect yourself by wearing long sleeves, pants, a wide-brimmed hat, and sunglasses whenever you are  outside. HEART DISEASE, DIABETES, AND HIGH BLOOD PRESSURE   High blood pressure causes heart disease and increases the risk of stroke. High blood pressure is more likely to develop in:  People who have blood pressure in the high end of the normal range (130-139/85-89 mm Hg).  People who are overweight or obese.  People who are African American.  If you are 66-47 years of age, have your blood pressure checked every 3-5 years. If you are 81 years of age or older, have your blood pressure checked every year. You should have your blood pressure measured twice--once when you are at a hospital or clinic, and once when you are not at a hospital or clinic. Record the average of the two measurements. To check your blood pressure when you are not at a hospital or clinic, you can use:  An automated blood pressure machine at a pharmacy.  A home blood pressure monitor.  If you are between 54 years and 76 years old, ask your health care provider if you should take aspirin to prevent strokes.  Have regular diabetes screenings. This involves taking a blood sample to check your fasting blood sugar level.  If you are at a normal weight and have a low risk for diabetes, have this test once every three years after 70 years of age.  If you are overweight and have a high risk for diabetes, consider being tested at a younger age or more often. PREVENTING INFECTION  Hepatitis B  If you have a higher risk for hepatitis B, you should be screened for this virus. You are considered at high risk for hepatitis B if:  You were born in a country where hepatitis B is common. Ask your health care provider which countries are considered high risk.  Your parents were born in a high-risk country, and you have not been immunized against hepatitis B (hepatitis B vaccine).  You have HIV or AIDS.  You use needles to inject street drugs.  You live with someone who has hepatitis B.  You have had sex with someone who has  hepatitis B.  You get hemodialysis treatment.  You take certain medicines for conditions, including cancer, organ transplantation, and autoimmune conditions. Hepatitis C  Blood testing is recommended for:  Everyone born from [birth year] through 1965.  Anyone with known risk factors for hepatitis C. Sexually transmitted infections (STIs)  You should be screened for sexually transmitted infections (STIs) including gonorrhea and chlamydia if:  You are sexually active and are younger than 70 years of age.  You are older than 70 years of age and your health care provider tells you that you are at risk for this type of infection.  Your sexual activity has changed since you were last screened and you are at an increased risk for chlamydia or gonorrhea. Ask your health care provider if you are at risk.  If you do not have HIV,  but are at risk, it may be recommended that you take a prescription medicine daily to prevent HIV infection. This is called pre-exposure prophylaxis (PrEP). You are considered at risk if:  You are sexually active and do not regularly use condoms or know the HIV status of your partner(s).  You take drugs by injection.  You are sexually active with a partner who has HIV. Talk with your health care provider about whether you are at high risk of being infected with HIV. If you choose to begin PrEP, you should first be tested for HIV. You should then be tested every 3 months for as long as you are taking PrEP.  PREGNANCY   If you are premenopausal and you may become pregnant, ask your health care provider about preconception counseling.  If you may become pregnant, take 400 to 800 micrograms (mcg) of folic acid every day.  If you want to prevent pregnancy, talk to your health care provider about birth control (contraception). OSTEOPOROSIS AND MENOPAUSE   Osteoporosis is a disease in which the bones lose minerals and strength with aging. This can result in serious bone  fractures. Your risk for osteoporosis can be identified using a bone density scan.  If you are 53 years of age or older, or if you are at risk for osteoporosis and fractures, ask your health care provider if you should be screened.  Ask your health care provider whether you should take a calcium or vitamin D supplement to lower your risk for osteoporosis.  Menopause may have certain physical symptoms and risks.  Hormone replacement therapy may reduce some of these symptoms and risks. Talk to your health care provider about whether hormone replacement therapy is right for you.  HOME CARE INSTRUCTIONS   Schedule regular health, dental, and eye exams.  Stay current with your immunizations.   Do not use any tobacco products including cigarettes, chewing tobacco, or electronic cigarettes.  If you are pregnant, do not drink alcohol.  If you are breastfeeding, limit how much and how often you drink alcohol.  Limit alcohol intake to no more than 1 drink per day for nonpregnant women. One drink equals 12 ounces of beer, 5 ounces of wine, or 1 ounces of hard liquor.  Do not use street drugs.  Do not share needles.  Ask your health care provider for help if you need support or information about quitting drugs.  Tell your health care provider if you often feel depressed.  Tell your health care provider if you have ever been abused or do not feel safe at home.   This information is not intended to replace advice given to you by your health care provider. Make sure you discuss any questions you have with your health care provider.   Document Released: 06/07/2011 Document Revised: 12/13/2014 Document Reviewed: 10/24/2013 Elsevier Interactive Patient Education [employer].

## 2016-04-05 ENCOUNTER — Encounter (HOSPITAL_COMMUNITY): Payer: Self-pay | Admitting: Nurse Practitioner

## 2016-04-05 ENCOUNTER — Encounter: Payer: Self-pay | Admitting: Cardiology

## 2016-04-05 ENCOUNTER — Ambulatory Visit (HOSPITAL_COMMUNITY)
Admission: RE | Admit: 2016-04-05 | Discharge: 2016-04-05 | Disposition: A | Discharge status: Home / Self Care | Payer: PPO | Source: Ambulatory Visit | Attending: Nurse Practitioner | Admitting: Nurse Practitioner

## 2016-04-05 ENCOUNTER — Encounter: Payer: Self-pay | Admitting: *Deleted

## 2016-04-05 ENCOUNTER — Telehealth: Payer: Self-pay

## 2016-04-05 ENCOUNTER — Ambulatory Visit (INDEPENDENT_AMBULATORY_CARE_PROVIDER_SITE_OTHER): Payer: PPO | Admitting: Cardiology

## 2016-04-05 VITALS — BP 128/62 | HR 80 | Ht 63.0 in | Wt 128.8 lb

## 2016-04-05 VITALS — BP 130/70 | HR 62 | Ht 63.0 in | Wt 128.8 lb

## 2016-04-05 DIAGNOSIS — Z87891 Personal history of nicotine dependence: Secondary | ICD-10-CM | POA: Insufficient documentation

## 2016-04-05 DIAGNOSIS — M8580 Other specified disorders of bone density and structure, unspecified site: Secondary | ICD-10-CM | POA: Diagnosis not present

## 2016-04-05 DIAGNOSIS — Z8249 Family history of ischemic heart disease and other diseases of the circulatory system: Secondary | ICD-10-CM | POA: Insufficient documentation

## 2016-04-05 DIAGNOSIS — I481 Persistent atrial fibrillation: Secondary | ICD-10-CM

## 2016-04-05 DIAGNOSIS — E785 Hyperlipidemia, unspecified: Secondary | ICD-10-CM | POA: Diagnosis not present

## 2016-04-05 DIAGNOSIS — K219 Gastro-esophageal reflux disease without esophagitis: Secondary | ICD-10-CM | POA: Diagnosis not present

## 2016-04-05 DIAGNOSIS — Z853 Personal history of malignant neoplasm of breast: Secondary | ICD-10-CM | POA: Diagnosis not present

## 2016-04-05 DIAGNOSIS — I4819 Other persistent atrial fibrillation: Secondary | ICD-10-CM

## 2016-04-05 DIAGNOSIS — Z885 Allergy status to narcotic agent status: Secondary | ICD-10-CM | POA: Diagnosis not present

## 2016-04-05 DIAGNOSIS — J309 Allergic rhinitis, unspecified: Secondary | ICD-10-CM | POA: Insufficient documentation

## 2016-04-05 DIAGNOSIS — Z79899 Other long term (current) drug therapy: Secondary | ICD-10-CM | POA: Diagnosis not present

## 2016-04-05 DIAGNOSIS — Z7901 Long term (current) use of anticoagulants: Secondary | ICD-10-CM | POA: Diagnosis not present

## 2016-04-05 DIAGNOSIS — I480 Paroxysmal atrial fibrillation: Secondary | ICD-10-CM

## 2016-04-05 NOTE — Progress Notes (Signed)
Electrophysiology Office Note   Date:  04/05/2016   ID:  Vicki [name], DOB 09-10-1946, MRN [id number]  PCP:  Walker Kehr, MD  Cardiologist:  Quay Burow Primary Electrophysiologist: Constance Haw, MD    Chief Complaint  Patient presents with  . Atrial Fibrillation     History of Present Illness: Vicki [name] is a 70 y.o. female who presents today for electrophysiology evaluation.   She has a history of persistent atrial fibrillation, hyperlipidemia, and aortic insufficiency. She is on Xarelto for [facility] of 2.  She's been having breakthrough episodes of atrial fibrillation while on her dofetilide.  She has lightheadedness and blurry vision as well as near syncope at times. She says that most the time she feels well but does go in atrial fibrillation. She presents today in sinus rhythm.   Today, she denies symptoms of palpitations, chest pain, shortness of breath, orthopnea, PND, lower extremity edema, claudication, dizziness, presyncope, syncope, bleeding, or neurologic sequela. The patient is tolerating medications without difficulties and is otherwise without complaint today.    Past Medical History  Diagnosis Date  . Allergic rhinitis   . Aortic insufficiency   . Paroxysmal atrial fibrillation (HCC)     A. fib is now persistent.  . Hyperlipidemia   . GERD (gastroesophageal reflux disease)   . Diverticulosis of colon   . Hx of colonic polyps   . Adenocarcinoma, breast ([place])   . DJD (degenerative joint disease)   . Osteopenia   . Anxiety   . Contact dermatitis   . Visit for monitoring Tikosyn therapy 09/06/2015   Past Surgical History  Procedure Laterality Date  . Abdominal hysterectomy  1984    with 1 ovary removal  . Breast lumpectomy Left 08/2007     with node bx     Current Outpatient Prescriptions  Medication Sig Dispense Refill  . cholecalciferol (VITAMIN D) 1000 UNITS tablet Take 1,000 Units by mouth 2 (two) times daily.    Marland Kitchen  dofetilide (TIKOSYN) 500 MCG capsule Take 1 capsule (500 mcg total) by mouth 2 (two) times daily. 60 capsule 11  . famotidine (PEPCID) 20 MG tablet TAKE ONE TABLET BY MOUTH ONCE DAILY 30 tablet 9  . loratadine (CLARITIN) 10 MG tablet Take 10 mg by mouth daily.    . Multiple Vitamins-Minerals (PRESERVISION AREDS PO) Take 1 tablet by mouth 2 (two) times daily.    . rivaroxaban (XARELTO) 20 MG TABS tablet Take 1 tablet (20 mg total) by mouth daily. 90 tablet 3  . simvastatin (ZOCOR) 20 MG tablet TAKE ONE TABLET BY MOUTH AT BEDTIME 90 tablet 1   No current facility-administered medications for this visit.    Allergies:   Codeine   Social History:  The patient  reports that she quit smoking about 39 years ago. Her smoking use included Cigarettes. She has never used smokeless tobacco. She reports that she does not drink alcohol or use illicit drugs.   Family History:  The patient's family history includes Clotting disorder (age of onset: 37) in her mother; Healthy in her brother; Heart attack in her father; Heart disease in her mother; Heart disease (age of onset: 105) in her father; Hypertension in her sister; Lymphoma (age of onset: 40) in her sister; Other (age of onset: 85) in her mother.    ROS:  Please see the history of present illness.   Otherwise, review of systems is positive palpitations.   All other systems are reviewed and negative.  PHYSICAL EXAM: VS:  BP 130/70 mmHg  Pulse 62  Ht 5\' 3"  (1.6 m)  Wt 128 lb 12.8 oz (58.423 kg)  BMI 22.82 kg/m2 , BMI Body mass index is 22.82 kg/(m^2). GEN: Well nourished, well developed, in no acute distress HEENT: normal Neck: no JVD, carotid bruits, or masses Cardiac: RRR; no murmurs, rubs, or gallops,no edema  Respiratory:  clear to auscultation bilaterally, normal work of breathing GI: soft, nontender, nondistended, + BS MS: no deformity or atrophy Skin: warm and dry Neuro:  Strength and sensation are intact Psych: euthymic mood, full  affect  EKG:  EKG is not ordered today. The ekg ordered today shows  Atrial fibrillation  Recent Labs: 08/07/2015: ALT 14; Hemoglobin 12.6; Platelets 296; TSH 1.733 09/22/2015: BUN 13; Creatinine, Ser 0.70; Magnesium 2.3; Potassium 4.4; Sodium 137    Lipid Panel     Component Value Date/Time   CHOL 167 07/04/2015 0956   TRIG 117.0 07/04/2015 0956   HDL 65.50 07/04/2015 0956   CHOLHDL 3 07/04/2015 0956   VLDL 23.4 07/04/2015 0956   LDLCALC 78 07/04/2015 0956     Wt Readings from Last 3 Encounters:  04/05/16 128 lb 12.8 oz (58.423 kg)  04/05/16 128 lb 12.8 oz (58.423 kg)  04/02/16 128 lb 4 oz (58.174 kg)      Other studies Reviewed: Additional studies/ records that were reviewed today include: 07/09/15 TTE Review of the above records today demonstrates: - Normal LV function; elevated LV filling pressure; mild AI; thickened MV with prolapse of the anterior leaflet; eccentric, posteriorly directed MR difficult to quantitate but appears to be mild (may be underestimated due to eccentric nature); mild TR, mildly elevated pulmonary pressures.    ASSESSMENT AND PLAN:  1.   PERSISTENT ATRIAL FIBRILLATION:  Patient was loaded on Dofetilide in September.  Today her EKG is unchanged. We [name] continue her  Dofetilide without any adjustments. She is also on  Xarelto for a chads 2 vasc of 2. I discussed with her further options of medications versus I told her that the only medicine that would be likely to help her at this point would be amiodarone, and she is not interested in that. I did discuss with her ablation. We talked about the risks and benefits. Risks include bleeding, tamponade, heart block, stroke, and damage to the esophagus. She understands these risks and wishes to proceed.    Current medicines are reviewed at length with the patient today.   The patient does not have concerns regarding her medicines.  The following changes were made today:    Labs/ tests ordered  today include:  No orders of the defined types were placed in this encounter.     Disposition:   FU with [name] 3  months  Signed, Arhianna Ebey Meredith Leeds, MD  04/05/2016 2:54 PM     [address] [place] [address] [phone] (office) [phone] (fax)

## 2016-04-05 NOTE — Progress Notes (Signed)
Patient ID: Vicki [name], female   DOB: [birth year], 70 y.o.   MRN: [id number]     Primary Care Physician: Walker Kehr, MD Referring Physician:Dr. Karlton Lemon Saina Manfred is a 70 y.o. female with a h/o PAF on tikosyn therapy for f/u. She reports that she is having lots of breakthrough afib, several times a week. She describes feeling at times very lightheaded and her vision will dim and she fears that she will pass out, but it only lasts a couple of seconds. She has also reached the do-nut hole with dofetilide and xarelto which is very expensive for her. He would like to discuss other options. Last echo showed normal atrial size. She exercises on a regular basis. Does not smoke or drink alcohol. No snoring.Per Dr. Curt Bears last note, if she failed tikosyn, consider for ablation.  Today, she denies symptoms of palpitations, chest pain, shortness of breath, orthopnea, PND, lower extremity edema, dizziness, presyncope, syncope, or neurologic sequela. Positive for periods of weakness and periods of lightheadedness. The patient is tolerating medications without difficulties and is otherwise without complaint today.   Past Medical History  Diagnosis Date  . Allergic rhinitis   . Aortic insufficiency   . Paroxysmal atrial fibrillation (HCC)     A. fib is now persistent.  . Hyperlipidemia   . GERD (gastroesophageal reflux disease)   . Diverticulosis of colon   . Hx of colonic polyps   . Adenocarcinoma, breast ([place])   . DJD (degenerative joint disease)   . Osteopenia   . Anxiety   . Contact dermatitis   . Visit for monitoring Tikosyn therapy 09/06/2015   Past Surgical History  Procedure Laterality Date  . Abdominal hysterectomy  1984    with 1 ovary removal  . Breast lumpectomy Left 08/2007     with node bx    Current Outpatient Prescriptions  Medication Sig Dispense Refill  . cholecalciferol (VITAMIN D) 1000 UNITS tablet Take 1,000 Units by mouth 2 (two) times daily.    Marland Kitchen  dofetilide (TIKOSYN) 500 MCG capsule Take 1 capsule (500 mcg total) by mouth 2 (two) times daily. 60 capsule 11  . famotidine (PEPCID) 20 MG tablet TAKE ONE TABLET BY MOUTH ONCE DAILY 30 tablet 9  . loratadine (CLARITIN) 10 MG tablet Take 10 mg by mouth daily.    . Multiple Vitamins-Minerals (PRESERVISION AREDS PO) Take 1 tablet by mouth 2 (two) times daily.    . rivaroxaban (XARELTO) 20 MG TABS tablet Take 1 tablet (20 mg total) by mouth daily. 90 tablet 3  . simvastatin (ZOCOR) 20 MG tablet TAKE ONE TABLET BY MOUTH AT BEDTIME 90 tablet 1   No current facility-administered medications for this encounter.    Allergies  Allergen Reactions  . Codeine     REACTION: unknown    Social History   Social History  . Marital Status: Married    Spouse Name: N/A  . Number of Children: 1  . Years of Education: N/A   Occupational History  . [employer]   Social History Main Topics  . Smoking status: Former Smoker    Types: Cigarettes    Quit date: 12/06/1976  . Smokeless tobacco: Never Used     Comment: smoked approx 10 years  . Alcohol Use: No  . Drug Use: No  . Sexual Activity: Not on file   Other Topics Concern  . Not on file   Social History Narrative    Family History  Problem Relation  Age of Onset  . Other Mother 28    AVR  . Heart disease Mother   . Clotting disorder Mother 94  . Heart disease Father 8  . Heart attack Father   . Lymphoma Sister 66  . Hypertension Sister   . Healthy Brother     ROS- All systems are reviewed and negative except as per the HPI above  Physical Exam: Filed Vitals:   04/05/16 0854  BP: 128/62  Pulse: 80  Height: 5\' 3"  (1.6 m)  Weight: 128 lb 12.8 oz (58.423 kg)    GEN- The patient is well appearing, alert and oriented x 3 today.   Head- normocephalic, atraumatic Eyes-  Sclera clear, conjunctiva pink Ears- hearing intact Oropharynx- clear Neck- supple, no JVP Lymph- no cervical lymphadenopathy Lungs- Clear to  ausculation bilaterally, normal work of breathing Heart- irregular rate and rhythm, no murmurs, rubs or gallops, PMI not laterally displaced GI- soft, NT, ND, + BS Extremities- no clubbing, cyanosis, or edema MS- no significant deformity or atrophy Skin- no rash or lesion Psych- euthymic mood, full affect Neuro- strength and sensation are intact  EKG- Afib at 80 bp,. qrs int 78 ms, qtc 475 ms Epic records reviewed  Assessment and Plan: 1. PAF Appears to be failing tikosyn and may be having post termination pauses Discussed options and she would not like amiodarone due to side effects Will send her back to Dr. Curt Bears  to further discuss.  Appointment available at 2:30 pm today and pt would like to be seen Will be further discussed if pt is to continue tikosyn Will defer labs until decision can be made re treatment Continue xarelto Pt Assistance forms given for tikosyn and Louanna Raw C. Termaine Roupp, [facility] [address] [place], [place] [address] [phone]

## 2016-04-05 NOTE — Telephone Encounter (Signed)
Just FYI,   Vicki [name] in for [facility] 04/28. Before leaving, noted irregular mole on posterior left shoulder; that had pink irregular border with center brown-black area which was also irregular. Not sure if this has changed any. Reviewed with Dr. Alain Marion and the patient is scheduled for shaved bx of mole on Friday 5/12.  This patient also noted recent  transient episodes described as feeling she may pass out with vision changes but resolve quickly at AWV; Occurrence estimated as 2 times per week x 2 months.  Cardiology fup was recommended and she was seen today by cardiology; stated she may need ablation; planning trip overseas soon; will go back this afternoon to discuss options.

## 2016-04-05 NOTE — Patient Instructions (Addendum)
Medication Instructions:  Your physician recommends that you continue on your current medications as directed. Please refer to the Current Medication list given to you today.  Labwork: You will have pre procedure lab work when you go to see Vicki [name] in the next month.  Testing/Procedures: Your physician has requested that you have cardiac CT (with in seven days of procedure on 6/1). Cardiac computed tomography (CT) is a painless test that uses an x-ray machine to take clear, detailed pictures of your heart. For further information please visit [email or web address]. Please follow instruction sheet as given.  Your physician has recommended that you have an AFib ablation. Catheter ablation is a medical procedure used to treat some cardiac arrhythmias (irregular heartbeats). During catheter ablation, a long, thin, flexible tube is put into a blood vessel in your groin (upper thigh), or neck. This tube is called an ablation catheter. It is then guided to your heart through the blood vessel. Radio frequency waves destroy small areas of heart tissue where abnormal heartbeats may cause an arrhythmia to start. Please see the instruction sheet given to you today.  Follow-Up: Your physician recommends that you schedule a follow-up appointment in: 2-3 weeks with Vicki Palau, NP in the AFib clinic for H&P and pre procedure lab work.  Your physician recommends that you schedule a follow-up appointment in: 4 weeks, after ablation on 05/07/16, with Vicki Palau, NP in the AFib clinic.  Your physician recommends that you schedule a follow-up appointment in: 3 months, after ablation on 05/07/16, with Vicki [name].  If you need a refill on your cardiac medications before your next appointment, please call your pharmacy.  Thank you for choosing [facility]!!   Trinidad Curet, RN [phone]   Any Other Special Instructions Will Be Listed Below (If Applicable).  Cardiac Ablation Cardiac ablation is a  procedure to disable a small amount of heart tissue in very specific places. The heart has many electrical connections. Sometimes these connections are abnormal and can cause the heart to beat very fast or irregularly. By disabling some of the problem areas, heart rhythm can be improved or made normal. Ablation is done for people who:   Have Wolff-Parkinson-White syndrome.   Have other fast heart rhythms (tachycardia).   Have taken medicines for an abnormal heart rhythm (arrhythmia) that resulted in:   No success.   Side effects.   May have a high-risk heartbeat that could result in death.  LET [facility] CARE PROVIDER KNOW ABOUT:   Any allergies you have or any previous reactions you have had to X-ray dye, food (such as seafood), medicine, or tape.   All medicines you are taking, including vitamins, herbs, eye drops, creams, and over-the-counter medicines.   Previous problems you or members of your family have had with the use of anesthetics.   Any blood disorders you have.   Previous surgeries or procedures (such as a kidney transplant) you have had.   Medical conditions you have (such as kidney failure).  RISKS AND COMPLICATIONS Generally, cardiac ablation is a safe procedure. However, problems can occur and include:   Increased risk of cancer. Depending on how long it takes to do the ablation, the dose of radiation can be high.  Bruising and bleeding where a thin, flexible tube (catheter) was inserted during the procedure.   Bleeding into the chest, especially into the sac that surrounds the heart (serious).  Need for a permanent pacemaker if the normal electrical system is damaged.   The procedure may  not be fully effective, and this may not be recognized for months. Repeat ablation procedures are sometimes required. BEFORE THE PROCEDURE   Follow any instructions from your health care provider regarding eating and drinking before the procedure.   Take  your medicines as directed at regular times with water, unless instructed otherwise by your health care provider. If you are taking diabetes medicine, including insulin, ask how you are to take it and if there are any special instructions you should follow. It is common to adjust insulin dosing the day of the ablation.  PROCEDURE  An ablation is usually performed in a catheterization laboratory with the guidance of fluoroscopy. Fluoroscopy is a type of X-ray that helps your health care provider see images of your heart during the procedure.   An ablation is a minimally invasive procedure. This means a small cut (incision) is made in either your neck or groin. Your health care provider will decide where to make the incision based on your medical history and physical exam.  An IV tube will be started before the procedure begins. You will be given an anesthetic or medicine to help you relax (sedative).  The skin on your neck or groin will be numbed. A needle will be inserted into a large vein in your neck or groin and catheters will be threaded to your heart.  A special dye that shows up on fluoroscopy pictures may be injected through the catheter. The dye helps your health care provider see the area of the heart that needs treatment.  The catheter has electrodes on the tip. When the area of heart tissue that is causing the arrhythmia is found, the catheter tip will send an electrical current to the area and "scar" the tissue. Three types of energy can be used to ablate the heart tissue:   Heat (radiofrequency energy).   Laser energy.   Extreme cold (cryoablation).   When the area of the heart has been ablated, the catheter will be taken out. Pressure will be held on the insertion site. This will help the insertion site clot and keep it from bleeding. A bandage will be placed on the insertion site.  AFTER THE PROCEDURE   After the procedure, you will be taken to a recovery area where  your vital signs (blood pressure, heart rate, and breathing) will be monitored. The insertion site will also be monitored for bleeding.   You will need to lie still for 4-6 hours. This is to ensure you do not bleed from the catheter insertion site.    This information is not intended to replace advice given to you by your health care provider. Make sure you discuss any questions you have with your health care provider.   Document Released: 04/10/2009 Document Revised: 12/13/2014 Document Reviewed: 04/16/2013 Elsevier Interactive Patient Education [employer].

## 2016-04-05 NOTE — Addendum Note (Signed)
Addended by: Stanton Kidney on: 04/05/2016 03:27 PM   Modules accepted: Orders

## 2016-04-06 ENCOUNTER — Encounter: Payer: Self-pay | Admitting: Cardiology

## 2016-04-12 ENCOUNTER — Encounter (INDEPENDENT_AMBULATORY_CARE_PROVIDER_SITE_OTHER): Payer: PPO | Admitting: Ophthalmology

## 2016-04-12 DIAGNOSIS — H2513 Age-related nuclear cataract, bilateral: Secondary | ICD-10-CM | POA: Diagnosis not present

## 2016-04-12 DIAGNOSIS — H353221 Exudative age-related macular degeneration, left eye, with active choroidal neovascularization: Secondary | ICD-10-CM | POA: Diagnosis not present

## 2016-04-12 DIAGNOSIS — H353111 Nonexudative age-related macular degeneration, right eye, early dry stage: Secondary | ICD-10-CM | POA: Diagnosis not present

## 2016-04-12 DIAGNOSIS — H35033 Hypertensive retinopathy, bilateral: Secondary | ICD-10-CM | POA: Diagnosis not present

## 2016-04-12 DIAGNOSIS — H43813 Vitreous degeneration, bilateral: Secondary | ICD-10-CM | POA: Diagnosis not present

## 2016-04-12 DIAGNOSIS — I10 Essential (primary) hypertension: Secondary | ICD-10-CM

## 2016-04-16 ENCOUNTER — Ambulatory Visit: Payer: PPO | Admitting: Internal Medicine

## 2016-04-16 ENCOUNTER — Encounter: Payer: Self-pay | Admitting: Internal Medicine

## 2016-04-16 ENCOUNTER — Ambulatory Visit (INDEPENDENT_AMBULATORY_CARE_PROVIDER_SITE_OTHER): Payer: PPO | Admitting: Internal Medicine

## 2016-04-16 VITALS — BP 140/90 | HR 55 | Wt 126.0 lb

## 2016-04-16 DIAGNOSIS — L820 Inflamed seborrheic keratosis: Secondary | ICD-10-CM | POA: Diagnosis not present

## 2016-04-16 DIAGNOSIS — I480 Paroxysmal atrial fibrillation: Secondary | ICD-10-CM | POA: Diagnosis not present

## 2016-04-16 DIAGNOSIS — D485 Neoplasm of uncertain behavior of skin: Secondary | ICD-10-CM | POA: Diagnosis not present

## 2016-04-16 NOTE — Assessment & Plan Note (Signed)
Relapsed Xarelto samples Ablation is sch for 05/07/16

## 2016-04-16 NOTE — Assessment & Plan Note (Signed)
5/17 L post shoulder

## 2016-04-16 NOTE — Progress Notes (Signed)
   Subjective:  Patient ID: Vicki [name], female    DOB: 17-Jun-1946  Age: 70 y.o. MRN: [id number]  CC: No chief complaint on file.   HPI Vicki [name] presents for a skin bx C/o being in a "donut hole" for Xarelto which she is taking for A fib  Outpatient Prescriptions Prior to Visit  Medication Sig Dispense Refill  . cholecalciferol (VITAMIN D) 1000 UNITS tablet Take 1,000 Units by mouth 2 (two) times daily.    Marland Kitchen dofetilide (TIKOSYN) 500 MCG capsule Take 1 capsule (500 mcg total) by mouth 2 (two) times daily. 60 capsule 11  . famotidine (PEPCID) 20 MG tablet TAKE ONE TABLET BY MOUTH ONCE DAILY 30 tablet 9  . loratadine (CLARITIN) 10 MG tablet Take 10 mg by mouth daily.    . Multiple Vitamins-Minerals (PRESERVISION AREDS PO) Take 1 tablet by mouth 2 (two) times daily.    . rivaroxaban (XARELTO) 20 MG TABS tablet Take 1 tablet (20 mg total) by mouth daily. 90 tablet 3  . simvastatin (ZOCOR) 20 MG tablet TAKE ONE TABLET BY MOUTH AT BEDTIME 90 tablet 1   No facility-administered medications prior to visit.    ROS Review of Systems  Respiratory: Negative for chest tightness and shortness of breath.   Cardiovascular: Positive for palpitations. Negative for chest pain.  Skin: Negative for wound.    Objective:  BP 140/90 mmHg  Pulse 55  Wt 126 lb (57.153 kg)  SpO2 98%  BP Readings from Last 3 Encounters:  04/16/16 140/90  04/05/16 130/70  04/05/16 128/62    Wt Readings from Last 3 Encounters:  04/16/16 126 lb (57.153 kg)  04/05/16 128 lb 12.8 oz (58.423 kg)  04/05/16 128 lb 12.8 oz (58.423 kg)    Physical Exam  Constitutional: She appears well-developed. No distress.  Mole  Lab Results  Component Value Date   WBC 6.2 08/07/2015   HGB 12.6 08/07/2015   HCT 37.4 08/07/2015   PLT 296 08/07/2015   GLUCOSE 85 09/22/2015   CHOL 167 07/04/2015   TRIG 117.0 07/04/2015   HDL 65.50 07/04/2015   LDLCALC 78 07/04/2015   ALT 14 08/07/2015   AST 18 08/07/2015   NA  137 09/22/2015   K 4.4 09/22/2015   CL 102 09/22/2015   CREATININE 0.70 09/22/2015   BUN 13 09/22/2015   CO2 27 09/22/2015   TSH 1.733 08/07/2015   INR 1.41 08/07/2015   Procedure - Excisional biopsy: Indication: skin cancer Risks including bleeding, infection, incomplete removal and other were discussed. Consent was signed.  Lesion #1 on   measuring  11x10 mm on L post shoulder   Skin over lesion #1  was prepped with Betadine and alcohol  and anesthetized with 2 cc of 2% lidocaine and epinephrine, using a 25-gauge 1 inch needle. Excisional biopsy with a sterile #11 blade was carried out in the usual eliptical  fashion. Closed with 2 internal Vicryl sutures. Derma glue applied to close the edges. Band-Aid was applied with antibiotic ointment. Tolerated well. Complications - none     No results found.  Assessment & Plan:   There are no diagnoses linked to this encounter. I am having Ms. Townsell maintain her loratadine, cholecalciferol, dofetilide, rivaroxaban, Multiple Vitamins-Minerals (PRESERVISION AREDS PO), famotidine, and simvastatin.  No orders of the defined types were placed in this encounter.     Follow-up: No Follow-up on file.  Walker Kehr, MD

## 2016-04-16 NOTE — Patient Instructions (Addendum)
Postprocedure instructions :    A Band-Aid should be  changed twice daily. You can take a shower tomorrow.  Keep the wounds clean. You can wash them with liquid soap and water. Pat dry with gauze or a Kleenex tissue  Before applying antibiotic ointment and a Band-Aid.   You need to report immediately  if fever, chills or any signs of infection develop.    The biopsy results should be available in 1 -2 weeks. 

## 2016-04-16 NOTE — Progress Notes (Signed)
Pre visit review using our clinic review tool, if applicable. No additional management support is needed unless otherwise documented below in the visit note. 

## 2016-04-27 ENCOUNTER — Encounter (HOSPITAL_COMMUNITY): Payer: Self-pay | Admitting: Nurse Practitioner

## 2016-04-27 ENCOUNTER — Ambulatory Visit (HOSPITAL_COMMUNITY)
Admission: RE | Admit: 2016-04-27 | Discharge: 2016-04-27 | Disposition: A | Discharge status: Home / Self Care | Payer: PPO | Source: Ambulatory Visit | Attending: Nurse Practitioner | Admitting: Nurse Practitioner

## 2016-04-27 VITALS — BP 130/68 | HR 56 | Ht 63.0 in | Wt 126.2 lb

## 2016-04-27 DIAGNOSIS — Z853 Personal history of malignant neoplasm of breast: Secondary | ICD-10-CM | POA: Insufficient documentation

## 2016-04-27 DIAGNOSIS — Z90711 Acquired absence of uterus with remaining cervical stump: Secondary | ICD-10-CM | POA: Diagnosis not present

## 2016-04-27 DIAGNOSIS — R9431 Abnormal electrocardiogram [ECG] [EKG]: Secondary | ICD-10-CM | POA: Insufficient documentation

## 2016-04-27 DIAGNOSIS — Z9889 Other specified postprocedural states: Secondary | ICD-10-CM | POA: Insufficient documentation

## 2016-04-27 DIAGNOSIS — Z87891 Personal history of nicotine dependence: Secondary | ICD-10-CM | POA: Diagnosis not present

## 2016-04-27 DIAGNOSIS — M8580 Other specified disorders of bone density and structure, unspecified site: Secondary | ICD-10-CM | POA: Insufficient documentation

## 2016-04-27 DIAGNOSIS — K219 Gastro-esophageal reflux disease without esophagitis: Secondary | ICD-10-CM | POA: Insufficient documentation

## 2016-04-27 DIAGNOSIS — E785 Hyperlipidemia, unspecified: Secondary | ICD-10-CM | POA: Insufficient documentation

## 2016-04-27 DIAGNOSIS — I4819 Other persistent atrial fibrillation: Secondary | ICD-10-CM

## 2016-04-27 DIAGNOSIS — Z8249 Family history of ischemic heart disease and other diseases of the circulatory system: Secondary | ICD-10-CM | POA: Insufficient documentation

## 2016-04-27 DIAGNOSIS — J309 Allergic rhinitis, unspecified: Secondary | ICD-10-CM | POA: Diagnosis not present

## 2016-04-27 DIAGNOSIS — R001 Bradycardia, unspecified: Secondary | ICD-10-CM | POA: Insufficient documentation

## 2016-04-27 DIAGNOSIS — F419 Anxiety disorder, unspecified: Secondary | ICD-10-CM | POA: Diagnosis not present

## 2016-04-27 DIAGNOSIS — Z8489 Family history of other specified conditions: Secondary | ICD-10-CM | POA: Insufficient documentation

## 2016-04-27 DIAGNOSIS — I481 Persistent atrial fibrillation: Secondary | ICD-10-CM

## 2016-04-27 DIAGNOSIS — I4891 Unspecified atrial fibrillation: Secondary | ICD-10-CM | POA: Diagnosis not present

## 2016-04-27 LAB — BASIC METABOLIC PANEL
ANION GAP: 6 (ref 5–15)
BUN: 15 mg/dL (ref 6–20)
CALCIUM: 9.2 mg/dL (ref 8.9–10.3)
CO2: 29 mmol/L (ref 22–32)
Chloride: 103 mmol/L (ref 101–111)
Creatinine, Ser: 0.74 mg/dL (ref 0.44–1.00)
Glucose, Bld: 106 mg/dL — ABNORMAL HIGH (ref 65–99)
Potassium: 4.2 mmol/L (ref 3.5–5.1)
Sodium: 138 mmol/L (ref 135–145)

## 2016-04-27 LAB — MAGNESIUM: MAGNESIUM: 2.1 mg/dL (ref 1.7–2.4)

## 2016-04-27 LAB — CBC
HCT: 36.9 % (ref 36.0–46.0)
HEMOGLOBIN: 11.9 g/dL — AB (ref 12.0–15.0)
MCH: 30.8 pg (ref 26.0–34.0)
MCHC: 32.2 g/dL (ref 30.0–36.0)
MCV: 95.6 fL (ref 78.0–100.0)
PLATELETS: 255 10*3/uL (ref 150–400)
RBC: 3.86 MIL/uL — AB (ref 3.87–5.11)
RDW: 12.8 % (ref 11.5–15.5)
WBC: 5.3 10*3/uL (ref 4.0–10.5)

## 2016-04-27 NOTE — Progress Notes (Signed)
Patient ID: Vicki [name], female   DOB: October 21, 1946, 70 y.o.   MRN: [id number]     Primary Care Physician: Walker Kehr, MD Referring Physician: Dr. Karlton Lemon Vella Albaugh is a 70 y.o. female with a h/o persistent afib that is pending an afib ablation 6/2 with Dr. Curt Bears, and is here for pre op visit. She is still having a high burden of afib despite tikosyn use. She is very weak with afib and is looking forward to have some relief form this. She loves to walk and some days does not have the energy to walk when she is in afib. Questions answered re ablation. Knows not to miss any xarelto prior to procedure.  Today, she denies symptoms of chest pain, shortness of breath, orthopnea, PND, lower extremity edema, dizziness, presyncope, syncope, or neurologic sequela. Positive for fatigue and shortness of breath.The patient is tolerating medications without difficulties and is otherwise without complaint today.   Past Medical History  Diagnosis Date  . Allergic rhinitis   . Aortic insufficiency   . Paroxysmal atrial fibrillation (HCC)     A. fib is now persistent.  . Hyperlipidemia   . GERD (gastroesophageal reflux disease)   . Diverticulosis of colon   . Hx of colonic polyps   . Adenocarcinoma, breast ([place])   . DJD (degenerative joint disease)   . Osteopenia   . Anxiety   . Contact dermatitis   . Visit for monitoring Tikosyn therapy 09/06/2015   Past Surgical History  Procedure Laterality Date  . Abdominal hysterectomy  1984    with 1 ovary removal  . Breast lumpectomy Left 08/2007     with node bx    Current Outpatient Prescriptions  Medication Sig Dispense Refill  . cholecalciferol (VITAMIN D) 1000 UNITS tablet Take 1,000 Units by mouth 2 (two) times daily.    Marland Kitchen dofetilide (TIKOSYN) 500 MCG capsule Take 1 capsule (500 mcg total) by mouth 2 (two) times daily. 60 capsule 11  . famotidine (PEPCID) 20 MG tablet TAKE ONE TABLET BY MOUTH ONCE DAILY 30 tablet 9  . loratadine  (CLARITIN) 10 MG tablet Take 10 mg by mouth daily.    . Multiple Vitamins-Minerals (PRESERVISION AREDS PO) Take 1 tablet by mouth 2 (two) times daily.    . rivaroxaban (XARELTO) 20 MG TABS tablet Take 1 tablet (20 mg total) by mouth daily. 90 tablet 3  . simvastatin (ZOCOR) 20 MG tablet TAKE ONE TABLET BY MOUTH AT BEDTIME 90 tablet 1   No current facility-administered medications for this encounter.    Allergies  Allergen Reactions  . Codeine     REACTION: unknown    Social History   Social History  . Marital Status: Married    Spouse Name: N/A  . Number of Children: 1  . Years of Education: N/A   Occupational History  . [employer]   Social History Main Topics  . Smoking status: Former Smoker    Types: Cigarettes    Quit date: 12/06/1976  . Smokeless tobacco: Never Used     Comment: smoked approx 10 years  . Alcohol Use: No  . Drug Use: No  . Sexual Activity: Not on file   Other Topics Concern  . Not on file   Social History Narrative    Family History  Problem Relation Age of Onset  . Other Mother 71    AVR  . Heart disease Mother   . Clotting disorder Mother 8  . Heart  disease Father 23  . Heart attack Father   . Lymphoma Sister 46  . Hypertension Sister   . Healthy Brother     ROS- All systems are reviewed and negative except as per the HPI above  Physical Exam: Filed Vitals:   04/27/16 1029  BP: 130/68  Pulse: 56  Height: 5\' 3"  (1.6 m)  Weight: 126 lb 3.2 oz (57.244 kg)    GEN- The patient is well appearing, alert and oriented x 3 today.   Head- normocephalic, atraumatic Eyes-  Sclera clear, conjunctiva pink Ears- hearing intact Oropharynx- clear Neck- supple, no JVP Lymph- no cervical lymphadenopathy Lungs- Clear to ausculation bilaterally, normal work of breathing Heart- Regular rate and rhythm, no murmurs, rubs or gallops, PMI not laterally displaced GI- soft, NT, ND, + BS Extremities- no clubbing, cyanosis, or  edema MS- no significant deformity or atrophy Skin- no rash or lesion Psych- euthymic mood, full affect Neuro- strength and sensation are intact  EKG-Sinus brady at 56 bpm, pr int 142 ms, qrs int 78 ms, qtc 428 ms Epic records reviewed  Assessment and Plan: 1. Symptomatic persistent afib Pending afib ablation 6/2 with Dr. Curt Bears Continue Phyllis Ginger Continue xarelto Bmet,cbc, mag today  afib clinic one month after procedure/as needed  Butch Penny C. Nylen Creque, [facility] [address] [place], [place] [address] [phone]

## 2016-04-30 ENCOUNTER — Ambulatory Visit (HOSPITAL_COMMUNITY)
Admission: RE | Admit: 2016-04-30 | Discharge: 2016-04-30 | Disposition: A | Discharge status: Home / Self Care | Payer: PPO | Source: Ambulatory Visit | Attending: Cardiology | Admitting: Cardiology

## 2016-04-30 ENCOUNTER — Encounter (HOSPITAL_COMMUNITY): Payer: Self-pay

## 2016-04-30 DIAGNOSIS — I481 Persistent atrial fibrillation: Secondary | ICD-10-CM | POA: Diagnosis not present

## 2016-04-30 DIAGNOSIS — N200 Calculus of kidney: Secondary | ICD-10-CM | POA: Diagnosis not present

## 2016-04-30 DIAGNOSIS — I4891 Unspecified atrial fibrillation: Secondary | ICD-10-CM | POA: Diagnosis not present

## 2016-04-30 DIAGNOSIS — I4819 Other persistent atrial fibrillation: Secondary | ICD-10-CM

## 2016-04-30 MED ORDER — IOPAMIDOL (ISOVUE-370) INJECTION 76%
INTRAVENOUS | Status: AC
  Administered 2016-04-30: 80 mL
  Filled 2016-04-30: qty 100

## 2016-04-30 MED ORDER — NITROGLYCERIN 0.4 MG SL SUBL
SUBLINGUAL_TABLET | SUBLINGUAL | Status: AC
  Filled 2016-04-30: qty 1

## 2016-04-30 MED ORDER — NITROGLYCERIN 0.4 MG SL SUBL
0.4000 mg | SUBLINGUAL_TABLET | Freq: Once | SUBLINGUAL | Status: AC
  Administered 2016-04-30: 0.4 mg via SUBLINGUAL
  Filled 2016-04-30: qty 25

## 2016-04-30 NOTE — Progress Notes (Signed)
CT scan completed. Tolerated well. D/C home walking with husband. Awake and alert. In no distress. 

## 2016-05-07 ENCOUNTER — Ambulatory Visit (HOSPITAL_COMMUNITY)
Admission: RE | Admit: 2016-05-07 | Discharge: 2016-05-08 | Disposition: A | Discharge status: Home / Self Care | Payer: PPO | Source: Ambulatory Visit | Attending: Cardiology | Admitting: Cardiology

## 2016-05-07 ENCOUNTER — Ambulatory Visit (HOSPITAL_COMMUNITY): Payer: PPO | Admitting: Certified Registered"

## 2016-05-07 ENCOUNTER — Encounter (HOSPITAL_COMMUNITY)
Admission: RE | Disposition: A | Discharge status: Home / Self Care | Payer: Self-pay | Source: Ambulatory Visit | Attending: Cardiology

## 2016-05-07 ENCOUNTER — Encounter (HOSPITAL_COMMUNITY): Payer: Self-pay | Admitting: *Deleted

## 2016-05-07 DIAGNOSIS — Z87891 Personal history of nicotine dependence: Secondary | ICD-10-CM | POA: Insufficient documentation

## 2016-05-07 DIAGNOSIS — I4891 Unspecified atrial fibrillation: Secondary | ICD-10-CM | POA: Diagnosis not present

## 2016-05-07 DIAGNOSIS — I480 Paroxysmal atrial fibrillation: Secondary | ICD-10-CM | POA: Diagnosis not present

## 2016-05-07 DIAGNOSIS — F419 Anxiety disorder, unspecified: Secondary | ICD-10-CM | POA: Insufficient documentation

## 2016-05-07 DIAGNOSIS — K219 Gastro-esophageal reflux disease without esophagitis: Secondary | ICD-10-CM | POA: Diagnosis not present

## 2016-05-07 DIAGNOSIS — E785 Hyperlipidemia, unspecified: Secondary | ICD-10-CM | POA: Diagnosis not present

## 2016-05-07 DIAGNOSIS — Z79899 Other long term (current) drug therapy: Secondary | ICD-10-CM | POA: Diagnosis not present

## 2016-05-07 DIAGNOSIS — Z853 Personal history of malignant neoplasm of breast: Secondary | ICD-10-CM | POA: Insufficient documentation

## 2016-05-07 DIAGNOSIS — J309 Allergic rhinitis, unspecified: Secondary | ICD-10-CM | POA: Diagnosis not present

## 2016-05-07 DIAGNOSIS — Z7901 Long term (current) use of anticoagulants: Secondary | ICD-10-CM | POA: Insufficient documentation

## 2016-05-07 HISTORY — PX: ELECTROPHYSIOLOGIC STUDY: SHX172A

## 2016-05-07 LAB — POCT ACTIVATED CLOTTING TIME
ACTIVATED CLOTTING TIME: 384 s
Activated Clotting Time: 348 seconds
Activated Clotting Time: 147 seconds

## 2016-05-07 LAB — MRSA PCR SCREENING: MRSA by PCR: NEGATIVE

## 2016-05-07 SURGERY — ATRIAL FIBRILLATION ABLATION
Anesthesia: Monitor Anesthesia Care

## 2016-05-07 MED ORDER — HEPARIN (PORCINE) IN NACL 2-0.9 UNIT/ML-% IJ SOLN
INTRAMUSCULAR | Status: AC
  Filled 2016-05-07: qty 500

## 2016-05-07 MED ORDER — HEPARIN SODIUM (PORCINE) 1000 UNIT/ML IJ SOLN
INTRAMUSCULAR | Status: DC | PRN
  Administered 2016-05-07: 1000 [IU] via INTRAVENOUS

## 2016-05-07 MED ORDER — SIMVASTATIN 20 MG PO TABS
20.0000 mg | ORAL_TABLET | Freq: Every day | ORAL | Status: DC
  Administered 2016-05-07: 20 mg via ORAL
  Filled 2016-05-07: qty 1

## 2016-05-07 MED ORDER — LORATADINE 10 MG PO TABS
10.0000 mg | ORAL_TABLET | Freq: Every day | ORAL | Status: DC
  Administered 2016-05-07 – 2016-05-08 (×2): 10 mg via ORAL
  Filled 2016-05-07 (×2): qty 1

## 2016-05-07 MED ORDER — SODIUM CHLORIDE 0.9 % IV SOLN: 250.0000 mL | INTRAVENOUS | Status: DC | PRN

## 2016-05-07 MED ORDER — HEPARIN (PORCINE) IN NACL 2-0.9 UNIT/ML-% IJ SOLN
INTRAMUSCULAR | Status: DC | PRN
  Administered 2016-05-07: 08:00:00

## 2016-05-07 MED ORDER — BUPIVACAINE HCL (PF) 0.25 % IJ SOLN
INTRAMUSCULAR | Status: DC | PRN
  Administered 2016-05-07: 10 mL

## 2016-05-07 MED ORDER — BUPIVACAINE HCL (PF) 0.25 % IJ SOLN
INTRAMUSCULAR | Status: AC
  Filled 2016-05-07: qty 30

## 2016-05-07 MED ORDER — HEPARIN SODIUM (PORCINE) 1000 UNIT/ML IJ SOLN
INTRAMUSCULAR | Status: AC
  Filled 2016-05-07: qty 1

## 2016-05-07 MED ORDER — ROCURONIUM BROMIDE 100 MG/10ML IV SOLN
INTRAVENOUS | Status: DC | PRN
  Administered 2016-05-07: 50 mg via INTRAVENOUS
  Administered 2016-05-07: 20 mg via INTRAVENOUS

## 2016-05-07 MED ORDER — ONDANSETRON HCL 4 MG/2ML IJ SOLN: 4.0000 mg | Freq: Four times a day (QID) | INTRAMUSCULAR | Status: DC | PRN

## 2016-05-07 MED ORDER — SODIUM CHLORIDE 0.9% FLUSH
3.0000 mL | Freq: Two times a day (BID) | INTRAVENOUS | Status: DC
  Administered 2016-05-07 – 2016-05-08 (×2): 3 mL via INTRAVENOUS

## 2016-05-07 MED ORDER — RIVAROXABAN 20 MG PO TABS: 20.0000 mg | ORAL_TABLET | Freq: Every day | ORAL | Status: DC

## 2016-05-07 MED ORDER — ONDANSETRON HCL 4 MG/2ML IJ SOLN: 4.0000 mg | Freq: Once | INTRAMUSCULAR | Status: DC | PRN

## 2016-05-07 MED ORDER — SODIUM CHLORIDE 0.9% FLUSH: 3.0000 mL | INTRAVENOUS | Status: DC | PRN

## 2016-05-07 MED ORDER — PROPOFOL 10 MG/ML IV BOLUS
INTRAVENOUS | Status: DC | PRN
  Administered 2016-05-07: 20 mg via INTRAVENOUS
  Administered 2016-05-07: 30 mg via INTRAVENOUS
  Administered 2016-05-07: 170 mg via INTRAVENOUS

## 2016-05-07 MED ORDER — FAMOTIDINE 20 MG PO TABS
20.0000 mg | ORAL_TABLET | Freq: Every day | ORAL | Status: DC
  Administered 2016-05-07 – 2016-05-08 (×2): 20 mg via ORAL
  Filled 2016-05-07 (×2): qty 1

## 2016-05-07 MED ORDER — PROTAMINE SULFATE 10 MG/ML IV SOLN
INTRAVENOUS | Status: DC | PRN
  Administered 2016-05-07: 40 mg via INTRAVENOUS

## 2016-05-07 MED ORDER — VITAMIN D 1000 UNITS PO TABS
1000.0000 [IU] | ORAL_TABLET | Freq: Two times a day (BID) | ORAL | Status: DC
  Administered 2016-05-07 – 2016-05-08 (×2): 1000 [IU] via ORAL
  Filled 2016-05-07 (×3): qty 1

## 2016-05-07 MED ORDER — POLYMYXIN B-TRIMETHOPRIM 10000-0.1 UNIT/ML-% OP SOLN: 1.0000 [drp] | OPHTHALMIC | Status: DC

## 2016-05-07 MED ORDER — RIVAROXABAN 20 MG PO TABS
20.0000 mg | ORAL_TABLET | Freq: Every day | ORAL | Status: DC
  Administered 2016-05-07: 20 mg via ORAL
  Filled 2016-05-07: qty 1

## 2016-05-07 MED ORDER — DOFETILIDE 500 MCG PO CAPS
500.0000 ug | ORAL_CAPSULE | Freq: Two times a day (BID) | ORAL | Status: DC
  Administered 2016-05-07 – 2016-05-08 (×3): 500 ug via ORAL
  Filled 2016-05-07 (×3): qty 1

## 2016-05-07 MED ORDER — DOBUTAMINE IN D5W 4-5 MG/ML-% IV SOLN
INTRAVENOUS | Status: AC
  Filled 2016-05-07: qty 250

## 2016-05-07 MED ORDER — FENTANYL CITRATE (PF) 100 MCG/2ML IJ SOLN
INTRAMUSCULAR | Status: DC | PRN
  Administered 2016-05-07: 100 ug via INTRAVENOUS

## 2016-05-07 MED ORDER — PHENYLEPHRINE HCL 10 MG/ML IJ SOLN
10.0000 mg | INTRAVENOUS | Status: DC | PRN
  Administered 2016-05-07: 50 ug/min via INTRAVENOUS

## 2016-05-07 MED ORDER — ALPRAZOLAM 0.5 MG PO TABS
1.0000 mg | ORAL_TABLET | Freq: Once | ORAL | Status: AC
  Administered 2016-05-07: 0.5 mg via ORAL
  Filled 2016-05-07: qty 2

## 2016-05-07 MED ORDER — DOBUTAMINE-DEXTROSE 2-5 MG/ML-% IV SOLN
INTRAVENOUS | Status: DC | PRN
  Administered 2016-05-07: 20 ug/kg/min via INTRAVENOUS

## 2016-05-07 MED ORDER — GLYCOPYRROLATE 0.2 MG/ML IJ SOLN
INTRAMUSCULAR | Status: DC | PRN
  Administered 2016-05-07: 0.1 mg via INTRAVENOUS

## 2016-05-07 MED ORDER — SUGAMMADEX SODIUM 200 MG/2ML IV SOLN
INTRAVENOUS | Status: DC | PRN
  Administered 2016-05-07: 100 mg via INTRAVENOUS

## 2016-05-07 MED ORDER — FENTANYL CITRATE (PF) 100 MCG/2ML IJ SOLN: 25.0000 ug | INTRAMUSCULAR | Status: DC | PRN

## 2016-05-07 MED ORDER — HYPROMELLOSE (GONIOSCOPIC) 2.5 % OP SOLN
1.0000 [drp] | OPHTHALMIC | Status: DC | PRN
  Filled 2016-05-07: qty 15

## 2016-05-07 MED ORDER — MIDAZOLAM HCL 5 MG/5ML IJ SOLN
INTRAMUSCULAR | Status: DC | PRN
  Administered 2016-05-07: 0.5 mg via INTRAVENOUS

## 2016-05-07 MED ORDER — ACETAMINOPHEN 325 MG PO TABS
650.0000 mg | ORAL_TABLET | ORAL | Status: DC | PRN
  Administered 2016-05-07 – 2016-05-08 (×2): 650 mg via ORAL
  Filled 2016-05-07: qty 2

## 2016-05-07 MED ORDER — DOPAMINE-DEXTROSE 3.2-5 MG/ML-% IV SOLN
INTRAVENOUS | Status: AC
  Filled 2016-05-07: qty 250

## 2016-05-07 MED ORDER — ACETAMINOPHEN 325 MG PO TABS
ORAL_TABLET | ORAL | Status: AC
  Filled 2016-05-07: qty 2

## 2016-05-07 MED ORDER — HEPARIN SODIUM (PORCINE) 1000 UNIT/ML IJ SOLN
INTRAMUSCULAR | Status: DC | PRN
  Administered 2016-05-07: 12000 [IU] via INTRAVENOUS

## 2016-05-07 MED ORDER — SALINE SPRAY 0.65 % NA SOLN
1.0000 | Freq: Every evening | NASAL | Status: DC | PRN
  Filled 2016-05-07: qty 44

## 2016-05-07 MED ORDER — LACTATED RINGERS IV SOLN
INTRAVENOUS | Status: DC | PRN
  Administered 2016-05-07: 07:00:00 via INTRAVENOUS

## 2016-05-07 SURGICAL SUPPLY — 18 items
BAG SNAP BAND KOVER 36X36 (MISCELLANEOUS) ×3 IMPLANT
CATH NAVISTAR SMARTTOUCH DF (ABLATOR) ×2 IMPLANT
CATH SOUNDSTAR 3D IMAGING (CATHETERS) ×2 IMPLANT
CATH VARIABLE LASSO NAV 2515 (CATHETERS) ×2 IMPLANT
CATH WEBSTER BI DIR CS D-F CRV (CATHETERS) ×2 IMPLANT
COVER SWIFTLINK CONNECTOR (BAG) ×3 IMPLANT
NDL TRANSSEPTAL BRK 98CM (NEEDLE) IMPLANT
NEEDLE TRANSSEPTAL BRK 98CM (NEEDLE) ×6 IMPLANT
PACK EP LATEX FREE (CUSTOM PROCEDURE TRAY) ×3
PACK EP LF (CUSTOM PROCEDURE TRAY) ×1 IMPLANT
PAD DEFIB LIFELINK (PAD) ×3 IMPLANT
PATCH CARTO3 (PAD) ×2 IMPLANT
SHEATH AGILIS NXT 8.5F 71CM (SHEATH) ×4 IMPLANT
SHEATH AVANTI 11F 11CM (SHEATH) ×2 IMPLANT
SHEATH PINNACLE 7F 10CM (SHEATH) ×2 IMPLANT
SHEATH PINNACLE 8F 10CM (SHEATH) ×4 IMPLANT
SHEATH PINNACLE VASC 9FR (SHEATH) ×4 IMPLANT
TUBING SMART ABLATE COOLFLOW (TUBING) ×2 IMPLANT

## 2016-05-07 NOTE — Anesthesia Postprocedure Evaluation (Signed)
Anesthesia Post Note  Patient: Vicki [name]  Procedure(s) Performed: Procedure(s) (LRB): Atrial Fibrillation Ablation (N/A)  Patient location during evaluation: Cath Lab Anesthesia Type: General Level of consciousness: awake, awake and alert and oriented Pain management: pain level controlled Vital Signs Assessment: post-procedure vital signs reviewed and stable Respiratory status: spontaneous breathing, nonlabored ventilation and respiratory function stable Cardiovascular status: blood pressure returned to baseline Anesthetic complications: no    Last Vitals:  Filed Vitals:   05/07/16 1239 05/07/16 1245  BP:    Pulse:  59  Temp: 36.5 C   Resp:  19    Last Pain: There were no vitals filed for this visit.               Caily Rakers COKER

## 2016-05-07 NOTE — H&P (Signed)
Vicki [name] presents today with atrial fibrillation.  Regular rhythm, no murmurs, lungs clear.  She has been on Tikosyn but has continued to have episodic symptoms.  Plan for today for atrial fibrillation ablation.  Risks and benefits explained.  Risks include bleeding, tamponade, heart block, stroke, and damage to surrounding organs, among others.  Patient understands these risks and has agreed to the procedure.    Alison Breeding Curt Bears, MD 05/07/2016 7:06 AM

## 2016-05-07 NOTE — Progress Notes (Signed)
Right and left femoral venous sheaths removed and pressure held for 25 minutes by Satira Sark RTR on the right side and [place] on the left side. Groins level 0. Distal pedal pulses intact. Patient given post instructions and verbalizes understanding.

## 2016-05-07 NOTE — Transfer of Care (Signed)
Immediate Anesthesia Transfer of Care Note  Patient: Vicki [name]  Procedure(s) Performed: Procedure(s): Atrial Fibrillation Ablation (N/A)  Patient Location: PACU  Anesthesia Type:General  Level of Consciousness: awake, alert , oriented and patient cooperative  Airway & Oxygen Therapy: Patient Spontanous Breathing and Patient connected to nasal cannula oxygen  Post-op Assessment: Report given to RN, Post -op Vital signs reviewed and stable and Patient moving all extremities  Post vital signs: Reviewed and stable  Last Vitals:  Filed Vitals:   05/07/16 0615  BP: 125/77  Pulse: 53  Temp: 36.4 C  Resp: 18    Last Pain: There were no vitals filed for this visit.    Patients Stated Pain Goal: 3 ([id number])  Complications: No apparent anesthesia complications

## 2016-05-07 NOTE — Anesthesia Procedure Notes (Signed)
Procedure Name: Intubation Date/Time: 05/07/2016 7:37 AM Performed by: Izora Gala Pre-anesthesia Checklist: Emergency Drugs available, Patient identified, Suction available and Patient being monitored Patient Re-evaluated:Patient Re-evaluated prior to inductionOxygen Delivery Method: Circle system utilized Preoxygenation: Pre-oxygenation with 100% oxygen Intubation Type: IV induction Ventilation: Mask ventilation without difficulty and Oral airway inserted - appropriate to patient size Laryngoscope Size: Miller and 3 Grade View: Grade I Tube type: Oral Tube size: 7.0 mm Number of attempts: 1 Airway Equipment and Method: Stylet Placement Confirmation: ETT inserted through vocal cords under direct vision,  positive ETCO2 and breath sounds checked- equal and bilateral Secured at: 21 cm Tube secured with: Tape Dental Injury: Teeth and Oropharynx as per pre-operative assessment

## 2016-05-07 NOTE — Discharge Instructions (Signed)
No driving for 1 week. No lifting over 5 lbs for 1 week. No vigorous or sexual activity for 1 week. You may return to work on 05/14/16. Keep procedure site clean & dry. If you notice increased pain, swelling, bleeding or pus, call/return!  You may shower, but no soaking baths/hot tubs/pools for 1 week.  ° ° °You have an appointment set up with the Atrial Fibrillation Clinic.  Multiple studies have shown that being followed by a dedicated atrial fibrillation clinic in addition to the standard care you receive from your other physicians improves health. We believe that enrollment in the atrial fibrillation clinic will allow us to better care for you.  ° °The phone number to the Atrial Fibrillation Clinic is [phone]. The clinic is staffed Monday through Friday from 8:30am to 5pm. ° °Parking Directions: The clinic is located in the Heart and Vascular Building connected to [facility] [place] [facility]. °1)From [address] turn on to [address] and go to the 3rd entrance  (Heart and Vascular entrance) on the right. °2)Look to the right for Heart &Vascular Parking Garage. °3)A code for the entrance is required please call the clinic to receive this.   °4)Take the elevators to the 1st floor. Registration is in the room with the glass walls at the end of the hallway. ° °If you have any trouble parking or locating the clinic, please don’t hesitate to call [phone]. ° °

## 2016-05-07 NOTE — Anesthesia Preprocedure Evaluation (Signed)
Anesthesia Evaluation  Patient identified by MRN, date of birth, ID band Patient awake    Reviewed: Allergy & Precautions, NPO status , Patient's Chart, lab work & pertinent test results, reviewed documented beta blocker date and time   History of Anesthesia Complications Negative for: history of anesthetic complications  Airway Mallampati: II  TM Distance: >3 FB Neck ROM: Full    Dental  (+) Teeth Intact   Pulmonary former smoker,    Pulmonary exam normal        Cardiovascular + dysrhythmias Atrial Fibrillation  Rhythm:Regular Rate:Bradycardia     Neuro/Psych negative neurological ROS  negative psych ROS   GI/Hepatic negative GI ROS, Neg liver ROS,   Endo/Other  negative endocrine ROS  Renal/GU negative Renal ROS     Musculoskeletal negative musculoskeletal ROS (+)   Abdominal Normal abdominal exam  (+)   Peds negative pediatric ROS (+)  Hematology negative hematology ROS (+)   Anesthesia Other Findings   Reproductive/Obstetrics negative OB ROS                             Anesthesia Physical Anesthesia Plan  ASA: II  Anesthesia Plan: General   Post-op Pain Management:    Induction: Intravenous  Airway Management Planned: Oral ETT  Additional Equipment:   Intra-op Plan:   Post-operative Plan: Extubation in OR  Informed Consent:   Plan Discussed with: Anesthesiologist and Surgeon  Anesthesia Plan Comments:         Anesthesia Quick Evaluation

## 2016-05-08 DIAGNOSIS — I480 Paroxysmal atrial fibrillation: Secondary | ICD-10-CM | POA: Diagnosis not present

## 2016-05-08 DIAGNOSIS — K219 Gastro-esophageal reflux disease without esophagitis: Secondary | ICD-10-CM | POA: Diagnosis not present

## 2016-05-08 DIAGNOSIS — F419 Anxiety disorder, unspecified: Secondary | ICD-10-CM | POA: Diagnosis not present

## 2016-05-08 DIAGNOSIS — E785 Hyperlipidemia, unspecified: Secondary | ICD-10-CM | POA: Diagnosis not present

## 2016-05-08 NOTE — Progress Notes (Signed)
Patient ID: Vicki [name], female   DOB: Mar 20, 1946, 69 y.o.   MRN: [id number]    Patient Name: Vicki [name] Date of Encounter: 05/08/2016     Active Problems:   AF (atrial fibrillation) ([place])    SUBJECTIVE  C/o being sore. Chest, throat and head all hurts. No sob.   CURRENT MEDS . cholecalciferol  1,000 Units Oral BID  . dofetilide  500 mcg Oral BID  . famotidine  20 mg Oral Daily  . loratadine  10 mg Oral Daily  . rivaroxaban  20 mg Oral Q supper  . simvastatin  20 mg Oral QHS  . sodium chloride flush  3 mL Intravenous Q12H    OBJECTIVE  Filed Vitals:   05/08/16 0200 05/08/16 0300 05/08/16 0400 05/08/16 0757  BP:   110/62 123/66  Pulse: 58 61 62 69  Temp:   98.7 F (37.1 C) 98.3 F (36.8 C)  TempSrc:   Oral Oral  Resp: 17 19 19 25   Height:      Weight:      SpO2: 94% 91% 93% 95%    Intake/Output Summary (Last 24 hours) at 05/08/16 1000 Last data filed at 05/08/16 0500  Gross per 24 hour  Intake    480 ml  Output    525 ml  Net    -45 ml   Filed Weights   05/07/16 0615  Weight: 120 lb (54.432 kg)    PHYSICAL EXAM  General: Pleasant, NAD. Neuro: Alert and oriented X 3. Moves all extremities spontaneously. Psych: Normal affect. HEENT:  Normal  Neck: Supple without bruits or JVD. Lungs:  Resp regular and unlabored, CTA. Heart: RRR no s3, s4, or murmurs. No rub Abdomen: Soft, non-tender, non-distended, BS + x 4.  Extremities: No clubbing, cyanosis or edema. DP/PT/Radials 2+ and equal bilaterally. No hematoma.  Accessory Clinical Findings  CBC No results for input(s): WBC, NEUTROABS, HGB, HCT, MCV, PLT in the last 72 hours. Basic Metabolic Panel No results for input(s): NA, K, CL, CO2, GLUCOSE, BUN, CREATININE, CALCIUM, MG, PHOS in the last 72 hours. Liver Function Tests No results for input(s): AST, ALT, ALKPHOS, BILITOT, PROT, ALBUMIN in the last 72 hours. No results for input(s): LIPASE, AMYLASE in the last 72 hours. Cardiac Enzymes No  results for input(s): CKTOTAL, CKMB, CKMBINDEX, TROPONINI in the last 72 hours. BNP Invalid input(s): POCBNP D-Dimer No results for input(s): DDIMER in the last 72 hours. Hemoglobin A1C No results for input(s): HGBA1C in the last 72 hours. Fasting Lipid Panel No results for input(s): CHOL, HDL, LDLCALC, TRIG, CHOLHDL, LDLDIRECT in the last 72 hours. Thyroid Function Tests No results for input(s): TSH, T4TOTAL, T3FREE, THYROIDAB in the last 72 hours.  Invalid input(s): FREET3  TELE  nsr  Radiology/Studies  Ct Heart Morp W/cta Cor W/score W/ca W/cm &/or Wo/cm  04/30/2016  ADDENDUM REPORT: 04/30/2016 18:25 CLINICAL DATA:  Atrial fibrillation scheduled for an ablation. EXAM: Cardiac CT/CTA TECHNIQUE: The patient was scanned on a Siemens Somatom scanner. FINDINGS: A 120 kV prospective scan was triggered in the descending thoracic aorta at 111 HU's. Gantry rotation speed was 280 msecs and collimation was .9 mm. No beta blockade and no NTG was given. The 3D data set was reconstructed in 5% intervals of the 60-80 % of the R-R cycle. Diastolic phases were analyzed on a dedicated work station using MPR, MIP and VRT modes. The patient received 80 cc of contrast. There is normal pulmonary vein drainage into the left atrium (2  on the right and 2 on the left). There is a large common pulmonary venous trunk on the left that further divides into two smaller pulmonary veins. RUPV:  18 x 17 mm RLPV:  18 x 16 mm Left common pulmonary venous trunk:  30 x 30 mm LUPV:  15 x 11 mm LLPV:  18 x 16 mm The left atrial appendage is large with chicken wing morphology with one lobe, ostial size 27 x 16 mm and length 43 mm. There is no thrombus in the left atrial appendage. The esophagus runs in the left atrial midline and is not in the proximity to any of the pulmonary veins. Aorta:  Normal caliber.  No dissection or calcifications. Aortic Valve:  Trileaflet.  No calcifications. Coronary Arteries: Right dominance. Left main  artery originates in between left and non-coronary cusp and then continues in its usual course. The study was performed without use of NTG and insufficient for plaque evaluation. Calcium score is 149 that represents 78 percentile for the age/sex. IMPRESSION: 1. There is normal pulmonary vein drainage into the left atrium. 2. The left atrial appendage is large with chicken wing morphology with one lobe, ostial size 24 x 16 mm and length 43 mm. There is no thrombus in the left atrial appendage. 3. The esophagus runs in the left atrial midline and is not in the proximity to any of the pulmonary veins. 4. Left main artery originates in between left and non-coronary cusp and then continues in its usual course. Calcium score is 149 that represents 78 percentile for the age/sex. Ena Dawley Electronically Signed   By: Ena Dawley   On: 04/30/2016 18:25  04/30/2016  EXAM: OVER-READ INTERPRETATION  CT CHEST The following report is an over-read performed by radiologist Dr. Rebekah Chesterfield [facility] on 04/30/2016. This over-read does not include interpretation of cardiac or coronary anatomy or pathology. The coronary calcium score/coronary CTA interpretation by the cardiologist is attached. COMPARISON:  Chest CT 08/21/2015. FINDINGS: Tiny calcified granuloma in the right lower lobe. Within the visualized portions of the thorax there are no larger more suspicious appearing pulmonary nodules or masses, there is no acute consolidative airspace disease, no pleural effusions, no pneumothorax and no lymphadenopathy. Visualized portions of the upper abdomen demonstrate calculi within the left renal collecting system, largest of which measures up to 8 mm. There are no aggressive appearing lytic or blastic lesions noted in the visualized portions of the skeleton. IMPRESSION: 1. Nonobstructive calculi in the left renal collecting system. Electronically Signed: By: Vinnie Langton M.D. On: 04/30/2016 12:32     ASSESSMENT AND PLAN  1. PAF - s/p atrial fib RFA. She is maintaining NSR and is stable for DC home despite being sore. Usual follow. Continue current meds.   Jedadiah Abdallah,M.D.  05/08/2016 10:00 AM

## 2016-05-08 NOTE — Discharge Summary (Signed)
Discharge Summary    Patient ID: Vicki [name],  MRN: [id number], DOB/AGE: 70-Dec-1947 70 y.o.  Admit date: 05/07/2016 Discharge date: 05/08/2016  Primary Care Provider: Walker Kehr Primary Cardiologist: Dr. Gwenlyn Found Primary Electrophysiologist: Will Meredith Leeds, MD  Discharge Diagnoses    Active Problems:   AF (atrial fibrillation) (HCC)   Allergies Allergies  Allergen Reactions  . Codeine     REACTION: unknown    Diagnostic Studies/Procedures    05/07/2016  Conclusion    SURGEON: Allegra Lai, MD  PREPROCEDURE DIAGNOSES: 1. Paroxysmal atrial fibrillation.  POSTPROCEDURE DIAGNOSES: 1. Paroxysmal atrial fibrillation.  PROCEDURES: 1. Comprehensive electrophysiologic study. 2. Coronary sinus pacing and recording. 3. Three-dimensional mapping of atrial fibrillation with additional mapping 4. Ablation of atrial fibrillation  5. Intracardiac echocardiography. 6. Transseptal puncture of an intact septum. 7. Arrhythmia induction with pacing with dobutamine infusion  INTRODUCTION: Vicki [name] is a 70 y.o. female with a history of paroxysmal atrial fibrillation who now presents for EP study and radiofrequency ablation. The patient reports initially being diagnosed with atrial fibrillation after presenting with symptomatic palpitations and fatgiue. The patient reports increasing frequency and duration of atrial fibrillation since that time. The patient has failed medical therapy. The patient therefore presents today for catheter ablation of atrial fibrillation.  DESCRIPTION OF PROCEDURE: Informed written consent was obtained, and the patient was brought to the electrophysiology lab in a fasting state. The patient was adequately sedated with intravenous medications as outlined in the anesthesia report. The patient's left and right groins were prepped and draped in the usual sterile fashion by the EP lab staff. Using a percutaneous Seldinger technique, two  7-French and one 11-French hemostasis sheaths were placed into the right common femoral vein.  Catheter Placement: A 7-French Biosense Webster Decapolar coronary sinus catheter was introduced through the right common femoral vein and advanced into the coronary sinus for recording and pacing from this location.   Initial Measurements: The patient presented to the electrophysiology lab in sinus rhythm. her PR interval measured 139 msec with a QRS duration of 72 msec and a QT interval of 504 msec.   Intracardiac Echocardiography: A 10-French Biosense Webster AcuNav intracardiac echocardiography catheter was introduced through the right common femoral vein and advanced into the right atrium. Intracardiac echocardiography was performed of the left atrium, and a three-dimensional anatomical rendering of the left atrium was performed using CARTO sound technology. The patient was noted to have a moderate sized left atrium. The interatrial septum was prominent but not aneurysmal. All 4 pulmonary veins were visualized and noted to have separate ostia. The pulmonary veins were moderate in size. The left atrial appendage was visualized and did not reveal thrombus. There was no evidence of pulmonary vein stenosis.   Transseptal Puncture: The right common femoral vein sheaths were exchanged for an 8.5 French Agillis transseptal sheath and transseptal access was achieved in a standard fashion using a Brockenbrough needle under fluoroscopy with intracardiac echocardiography confirmation of the transseptal puncture. Once transseptal access had been achieved, heparin was administered intravenously and intra- arterially in order to maintain an ACT of greater than 350 seconds throughout the procedure.   3D Mapping and Ablation: A 3.5 mm Biosense [employer] Thermocool ablation catheter was advanced into the left atrium through one agillis sheath. A duodecapolar Biosense Webster circular mapping  catheter was introduced through the transseptal sheath and positioned over the mouth of all 4 pulmonary veins. Three-dimensional electroanatomical mapping was performed using CARTO technology. This demonstrated  electrical activity within all four pulmonary veins at baseline. The patient underwent successful sequential electrical isolation and anatomical encircling of all four pulmonary veins using radiofrequency current with a circular mapping catheter as a guide. A WACA approach was used. Entrance and exit block were confirmed.   Measurements Following Ablation: Following ablation, dobutamine was infused up to 20 mcg/kg/min with no inducible atrial fibrillation, atrial tachycardia, atrial flutter, or sustained PACs. In sinus rhythm with RR interval was 976 msec, with PR 147 msec, QRS 81 msec, and Qt 488 msec. Following ablation the AH interval measured 60 msec with an HV interval of 29 msec. Ventricular pacing was performed, which revealed midline decremental VA conduction with a VA Wenckebach cycle length of less than 300 msec.  Rapid atrial pacing was performed, which revealed an AV Wenckebach cycle length of 430 msec, AVNERP 600/300 msec. VAERP 600/470 msec.   The procedure was therefore considered completed. All catheters were removed, and the sheaths were aspirated and flushed. The patient was transferred to the recovery area for sheath removal per protocol. EBL<63ml. A limited bedside transthoracic echocardiogram revealed no pericardial effusion. There were no early apparent complications.  CONCLUSIONS: 1. Sinus rhythm upon presentation.  2.Successful electrical isolation and anatomical encircling of all four pulmonary veins with radiofrequency current. 3. No inducible arrhythmias following ablation both on and off of dobutamine 4. No early apparent complications.    _____________   History of Present Illness     Vicki [name] is a 70 female with PMH of PAF/HLD/arotic valve  insufficiency who presented to the EP office on 04/05/2016 with reports of breakthrough symptomatic a-fib while on dofetilide. She was offered the medication option of amiodarone at the office visit, but was not interested at that time. The risks and benefits of ablation were explained to her and procedure was planned.   Hospital Course     Vicki [name] presented for outpatient atrial fibrillation ablation on 05/07/2016 with Dr. Curt Bears. She underwent successful procedure with NSR noted post-procedure. She had not inducible arrhythmias following the ablation both on and off dobutamine. She was admitted to [facility] overnight and no arrhythmias noted during the admission. EKG on 05/08/2016 showed NSR with a rate of 79.   She has been seen and examined by Dr. Lovena Le on the day of discharge and did c/o chest, throat and head discomfort. Given tylenol with improvement. Is considered stable for discharge.  Follow up has been arranged prior to admission and has been added to discharge instructions.  _____________  Discharge Vitals Blood pressure 116/61, pulse 63, temperature 99.2 F (37.3 C), temperature source Oral, resp. rate 24, height 5\' 3"  (1.6 m), weight 120 lb (54.432 kg), SpO2 96 %.  Filed Weights   05/07/16 0615  Weight: 120 lb (54.432 kg)    Labs & Radiologic Studies     Disposition   Pt is being discharged home today in good condition.  Follow-up Plans & Appointments    Follow-up Information    Follow up with [facility] On 06/07/2016.   Specialty:  Cardiology   Why:  11:30AM   Contact information:   [address] [id number] [place] [phone]      Follow up with Will Meredith Leeds, MD On 08/11/2016.   Specialty:  Cardiology   Why:  9:30AM   Contact information:   [place] [address] [phone]        Discharge Medications   Current Discharge  Medication List    CONTINUE these  medications which have NOT CHANGED   Details  cholecalciferol (VITAMIN D) 1000 UNITS tablet Take 1,000 Units by mouth 2 (two) times daily.    dofetilide (TIKOSYN) 500 MCG capsule Take 1 capsule (500 mcg total) by mouth 2 (two) times daily. Qty: 60 capsule, Refills: 11    famotidine (PEPCID) 20 MG tablet TAKE ONE TABLET BY MOUTH ONCE DAILY Qty: 30 tablet, Refills: 9    loratadine (CLARITIN) 10 MG tablet Take 10 mg by mouth daily.    Multiple Vitamins-Minerals (PRESERVISION AREDS PO) Take 1 tablet by mouth 2 (two) times daily.    rivaroxaban (XARELTO) 20 MG TABS tablet Take 1 tablet (20 mg total) by mouth daily. Qty: 90 tablet, Refills: 3    simvastatin (ZOCOR) 20 MG tablet TAKE ONE TABLET BY MOUTH AT BEDTIME Qty: 90 tablet, Refills: 1    sodium chloride (OCEAN) 0.65 % SOLN nasal spray Place 1 spray into both nostrils at bedtime as needed for congestion.    triamcinolone ointment (KENALOG) 0.5 % Apply 1 application topically 2 (two) times daily as needed.     trimethoprim-polymyxin b (POLYTRIM) ophthalmic solution Place 1 drop into the left eye See admin instructions. After eye injection, every 5 weeks, 1 drop 4 times daily in Left eye for 2 days    hydroxypropyl methylcellulose / hypromellose (ISOPTO TEARS / GONIOVISC) 2.5 % ophthalmic solution Place 1 drop into both eyes as needed for dry eyes.           Outstanding Labs/Studies   None  Duration of Discharge Encounter   Greater than 30 minutes including physician time.  Signed, Reino Bellis NP-C 05/08/2016, 11:45 AM  EP Attending  Patient seen and examined. Agree with above. She is s/p catheter ablation and appears stable for DC home. Usual follow up.  Mikle Bosworth.D.

## 2016-05-10 ENCOUNTER — Encounter (HOSPITAL_COMMUNITY): Payer: Self-pay | Admitting: Cardiology

## 2016-05-10 ENCOUNTER — Telehealth (HOSPITAL_COMMUNITY): Payer: Self-pay | Admitting: *Deleted

## 2016-05-10 MED FILL — Dopamine in Dextrose 5% Inj 3.2 MG/ML: INTRAVENOUS | Qty: 250 | Status: AC

## 2016-05-10 NOTE — Telephone Encounter (Signed)
Pt called in stating since leaving hospital she has developed an itching rash over chest, under breasts and abdominal area. The rash is not raise but itches. She is not having any shortness of breath and the rash does not seem to be spreading. No new medications noted. Encouraged use of hydrocortisone cream and call back if rash does not improve over next several days. Pt is on Tikosyn so benadryl should be avoided due to prolonged QT. Pt verbalized understanding.

## 2016-05-21 ENCOUNTER — Encounter (INDEPENDENT_AMBULATORY_CARE_PROVIDER_SITE_OTHER): Payer: PPO | Admitting: Ophthalmology

## 2016-05-21 DIAGNOSIS — H43813 Vitreous degeneration, bilateral: Secondary | ICD-10-CM | POA: Diagnosis not present

## 2016-05-21 DIAGNOSIS — H35033 Hypertensive retinopathy, bilateral: Secondary | ICD-10-CM | POA: Diagnosis not present

## 2016-05-21 DIAGNOSIS — H353112 Nonexudative age-related macular degeneration, right eye, intermediate dry stage: Secondary | ICD-10-CM

## 2016-05-21 DIAGNOSIS — H2513 Age-related nuclear cataract, bilateral: Secondary | ICD-10-CM | POA: Diagnosis not present

## 2016-05-21 DIAGNOSIS — I10 Essential (primary) hypertension: Secondary | ICD-10-CM | POA: Diagnosis not present

## 2016-05-21 DIAGNOSIS — H353221 Exudative age-related macular degeneration, left eye, with active choroidal neovascularization: Secondary | ICD-10-CM

## 2016-06-07 ENCOUNTER — Other Ambulatory Visit: Payer: Self-pay

## 2016-06-07 ENCOUNTER — Ambulatory Visit (HOSPITAL_COMMUNITY)
Admission: RE | Admit: 2016-06-07 | Discharge: 2016-06-07 | Disposition: A | Discharge status: Home / Self Care | Payer: PPO | Source: Ambulatory Visit | Attending: Nurse Practitioner | Admitting: Nurse Practitioner

## 2016-06-07 ENCOUNTER — Encounter (HOSPITAL_COMMUNITY): Payer: Self-pay | Admitting: Nurse Practitioner

## 2016-06-07 VITALS — BP 140/76 | HR 56 | Ht 63.0 in | Wt 122.2 lb

## 2016-06-07 DIAGNOSIS — Z79899 Other long term (current) drug therapy: Secondary | ICD-10-CM | POA: Diagnosis not present

## 2016-06-07 DIAGNOSIS — I4819 Other persistent atrial fibrillation: Secondary | ICD-10-CM

## 2016-06-07 DIAGNOSIS — Z853 Personal history of malignant neoplasm of breast: Secondary | ICD-10-CM | POA: Diagnosis not present

## 2016-06-07 DIAGNOSIS — K219 Gastro-esophageal reflux disease without esophagitis: Secondary | ICD-10-CM | POA: Insufficient documentation

## 2016-06-07 DIAGNOSIS — E785 Hyperlipidemia, unspecified: Secondary | ICD-10-CM | POA: Diagnosis not present

## 2016-06-07 DIAGNOSIS — Z885 Allergy status to narcotic agent status: Secondary | ICD-10-CM | POA: Diagnosis not present

## 2016-06-07 DIAGNOSIS — I4891 Unspecified atrial fibrillation: Secondary | ICD-10-CM | POA: Insufficient documentation

## 2016-06-07 DIAGNOSIS — Z9889 Other specified postprocedural states: Secondary | ICD-10-CM | POA: Insufficient documentation

## 2016-06-07 DIAGNOSIS — Z87891 Personal history of nicotine dependence: Secondary | ICD-10-CM | POA: Diagnosis not present

## 2016-06-07 DIAGNOSIS — Z7901 Long term (current) use of anticoagulants: Secondary | ICD-10-CM | POA: Diagnosis not present

## 2016-06-07 DIAGNOSIS — Z8249 Family history of ischemic heart disease and other diseases of the circulatory system: Secondary | ICD-10-CM | POA: Diagnosis not present

## 2016-06-07 DIAGNOSIS — I481 Persistent atrial fibrillation: Secondary | ICD-10-CM

## 2016-06-07 NOTE — Progress Notes (Signed)
Patient ID: Vicki [name], female   DOB: [birth year], 70 y.o.   MRN: [id number]     Primary Care Physician: Walker Kehr, MD Referring Physician: Dr. Karlton Lemon Vicki [name] is a 70 y.o. female with a h/o afib s/p ablation 6/2 and continues on tikosyn. She did notice some afib in first 1-2 weeks but for the last week has been feeling great. SR on EKG today.No issues with swallowing or groin. She is planning a trip to [place] in the next 1-2 weeks and we did discuss how to time the tikosyn.  Today, she denies symptoms of palpitations, chest pain, shortness of breath, orthopnea, PND, lower extremity edema, dizziness, presyncope, syncope, or neurologic sequela. The patient is tolerating medications without difficulties and is otherwise without complaint today.   Past Medical History  Diagnosis Date  . Allergic rhinitis   . Aortic insufficiency   . Paroxysmal atrial fibrillation (HCC)     A. fib is now persistent.  . Hyperlipidemia   . GERD (gastroesophageal reflux disease)   . Diverticulosis of colon   . Hx of colonic polyps   . Adenocarcinoma, breast ([place])   . DJD (degenerative joint disease)   . Osteopenia   . Anxiety   . Contact dermatitis   . Visit for monitoring Tikosyn therapy 09/06/2015   Past Surgical History  Procedure Laterality Date  . Abdominal hysterectomy  1984    with 1 ovary removal  . Breast lumpectomy Left 08/2007     with node bx  . Electrophysiologic study N/A 05/07/2016    Procedure: Atrial Fibrillation Ablation;  Surgeon: Will Meredith Leeds, MD;  Location: [facility];  Service: Cardiovascular;  Laterality: N/A;    Current Outpatient Prescriptions  Medication Sig Dispense Refill  . cholecalciferol (VITAMIN D) 1000 UNITS tablet Take 1,000 Units by mouth 2 (two) times daily.    Marland Kitchen dofetilide (TIKOSYN) 500 MCG capsule Take 1 capsule (500 mcg total) by mouth 2 (two) times daily. 60 capsule 11  . famotidine (PEPCID) 20 MG tablet TAKE ONE TABLET BY  MOUTH ONCE DAILY 30 tablet 9  . hydroxypropyl methylcellulose / hypromellose (ISOPTO TEARS / GONIOVISC) 2.5 % ophthalmic solution Place 1 drop into both eyes as needed for dry eyes.    Marland Kitchen loratadine (CLARITIN) 10 MG tablet Take 10 mg by mouth daily.    . Multiple Vitamins-Minerals (PRESERVISION AREDS PO) Take 1 tablet by mouth 2 (two) times daily.    . rivaroxaban (XARELTO) 20 MG TABS tablet Take 1 tablet (20 mg total) by mouth daily. 90 tablet 3  . simvastatin (ZOCOR) 20 MG tablet TAKE ONE TABLET BY MOUTH AT BEDTIME 90 tablet 1  . sodium chloride (OCEAN) 0.65 % SOLN nasal spray Place 1 spray into both nostrils at bedtime as needed for congestion.    . triamcinolone ointment (KENALOG) 0.5 % Apply 1 application topically 2 (two) times daily as needed.     . trimethoprim-polymyxin b (POLYTRIM) ophthalmic solution Place 1 drop into the left eye See admin instructions. After eye injection, every 5 weeks, 1 drop 4 times daily in Left eye for 2 days     No current facility-administered medications for this encounter.    Allergies  Allergen Reactions  . Codeine     REACTION: unknown    Social History   Social History  . Marital Status: Married    Spouse Name: N/A  . Number of Children: 1  . Years of Education: N/A   Occupational History  .  [employer]   Social History Main Topics  . Smoking status: Former Smoker    Types: Cigarettes    Quit date: 12/06/1976  . Smokeless tobacco: Never Used     Comment: smoked approx 10 years  . Alcohol Use: No  . Drug Use: No  . Sexual Activity: Not on file   Other Topics Concern  . Not on file   Social History Narrative    Family History  Problem Relation Age of Onset  . Other Mother 83    AVR  . Heart disease Mother   . Clotting disorder Mother 56  . Heart disease Father 26  . Heart attack Father   . Lymphoma Sister 55  . Hypertension Sister   . Healthy Brother     ROS- All systems are reviewed and negative except as  per the HPI above  Physical Exam: Filed Vitals:   06/07/16 1111  BP: 140/76  Pulse: 56  Height: 5\' 3"  (1.6 m)  Weight: 122 lb 3.2 oz (55.43 kg)    GEN- The patient is well appearing, alert and oriented x 3 today.   Head- normocephalic, atraumatic Eyes-  Sclera clear, conjunctiva pink Ears- hearing intact Oropharynx- clear Neck- supple, no JVP Lymph- no cervical lymphadenopathy Lungs- Clear to ausculation bilaterally, normal work of breathing Heart- Regular rate and rhythm, no murmurs, rubs or gallops, PMI not laterally displaced GI- soft, NT, ND, + BS Extremities- no clubbing, cyanosis, or edema MS- no significant deformity or atrophy Skin- no rash or lesion Psych- euthymic mood, full affect Neuro- strength and sensation are intact  EKG- Sinus brady at 56 bpm, pr int 146 ms , qrs int 78 ms, qtc 434 ms Epic records reviewed Last k+/mag 5/17 in range for tikosyn  Assessment and Plan: 1. Afib s/p ablation In Sr and feeling well Pt will try to stay on time here when travels to [place] and will set alarm at night to wake her to take drug She asked for a sleeping pill on the plane and discussed with pharmD and only melatonin would be safe to use with tikosyn.  Continue xarelto  F/u as planned with Dr. Curt Bears in September as scheduled  Geroge Baseman. [place], [facility] [address] [place], [place] [address] [phone]

## 2016-07-02 ENCOUNTER — Encounter (INDEPENDENT_AMBULATORY_CARE_PROVIDER_SITE_OTHER): Payer: PPO | Admitting: Ophthalmology

## 2016-07-02 ENCOUNTER — Ambulatory Visit: Payer: PPO | Admitting: Internal Medicine

## 2016-07-02 DIAGNOSIS — H43813 Vitreous degeneration, bilateral: Secondary | ICD-10-CM

## 2016-07-02 DIAGNOSIS — H353111 Nonexudative age-related macular degeneration, right eye, early dry stage: Secondary | ICD-10-CM | POA: Diagnosis not present

## 2016-07-02 DIAGNOSIS — H35033 Hypertensive retinopathy, bilateral: Secondary | ICD-10-CM

## 2016-07-02 DIAGNOSIS — H2513 Age-related nuclear cataract, bilateral: Secondary | ICD-10-CM | POA: Diagnosis not present

## 2016-07-02 DIAGNOSIS — H353221 Exudative age-related macular degeneration, left eye, with active choroidal neovascularization: Secondary | ICD-10-CM | POA: Diagnosis not present

## 2016-07-02 DIAGNOSIS — I10 Essential (primary) hypertension: Secondary | ICD-10-CM | POA: Diagnosis not present

## 2016-07-07 ENCOUNTER — Ambulatory Visit (INDEPENDENT_AMBULATORY_CARE_PROVIDER_SITE_OTHER): Payer: PPO | Admitting: Internal Medicine

## 2016-07-07 ENCOUNTER — Encounter: Payer: Self-pay | Admitting: Internal Medicine

## 2016-07-07 ENCOUNTER — Other Ambulatory Visit (INDEPENDENT_AMBULATORY_CARE_PROVIDER_SITE_OTHER): Payer: PPO

## 2016-07-07 VITALS — BP 148/78 | HR 60 | Wt 121.0 lb

## 2016-07-07 DIAGNOSIS — I480 Paroxysmal atrial fibrillation: Secondary | ICD-10-CM

## 2016-07-07 DIAGNOSIS — R911 Solitary pulmonary nodule: Secondary | ICD-10-CM | POA: Diagnosis not present

## 2016-07-07 DIAGNOSIS — E785 Hyperlipidemia, unspecified: Secondary | ICD-10-CM

## 2016-07-07 LAB — CBC WITH DIFFERENTIAL/PLATELET
BASOS ABS: 0 10*3/uL (ref 0.0–0.1)
BASOS PCT: 0.4 % (ref 0.0–3.0)
EOS PCT: 0.1 % (ref 0.0–5.0)
Eosinophils Absolute: 0 10*3/uL (ref 0.0–0.7)
HEMATOCRIT: 39.4 % (ref 36.0–46.0)
Hemoglobin: 13.4 g/dL (ref 12.0–15.0)
LYMPHS ABS: 1.7 10*3/uL (ref 0.7–4.0)
LYMPHS PCT: 25.9 % (ref 12.0–46.0)
MCHC: 33.9 g/dL (ref 30.0–36.0)
MCV: 94.4 fl (ref 78.0–100.0)
MONOS PCT: 12.8 % — AB (ref 3.0–12.0)
Monocytes Absolute: 0.9 10*3/uL (ref 0.1–1.0)
NEUTROS ABS: 4.1 10*3/uL (ref 1.4–7.7)
NEUTROS PCT: 60.8 % (ref 43.0–77.0)
PLATELETS: 277 10*3/uL (ref 150.0–400.0)
RBC: 4.18 Mil/uL (ref 3.87–5.11)
RDW: 12.8 % (ref 11.5–15.5)
WBC: 6.7 10*3/uL (ref 4.0–10.5)

## 2016-07-07 LAB — BASIC METABOLIC PANEL
BUN: 16 mg/dL (ref 6–23)
CALCIUM: 10.1 mg/dL (ref 8.4–10.5)
CHLORIDE: 102 meq/L (ref 96–112)
CO2: 26 mEq/L (ref 19–32)
CREATININE: 0.68 mg/dL (ref 0.40–1.20)
GFR: 90.97 mL/min (ref >60.00)
Glucose, Bld: 79 mg/dL (ref 70–99)
Potassium: 5.3 mEq/L — ABNORMAL HIGH (ref 3.5–5.1)
Sodium: 139 mEq/L (ref 135–145)

## 2016-07-07 LAB — TSH: TSH: 1.68 u[IU]/mL (ref 0.35–4.50)

## 2016-07-07 NOTE — Assessment & Plan Note (Addendum)
  Stable 2 mm right upper lobe nodule. This could be followed with repeat CT in 12 months.  Repeat chest CT in 2/18

## 2016-07-07 NOTE — Progress Notes (Signed)
Subjective:  Patient ID: Vicki [name], female    DOB: 13-Oct-1946  Age: 70 y.o. MRN: [id number]  CC: No chief complaint on file.   HPI Vicki [name] presents for A fib, GERD, dyslipidemia f/u. S/p ablation 6/17  Outpatient Medications Prior to Visit  Medication Sig Dispense Refill  . cholecalciferol (VITAMIN D) 1000 UNITS tablet Take 1,000 Units by mouth 2 (two) times daily.    Marland Kitchen dofetilide (TIKOSYN) 500 MCG capsule Take 1 capsule (500 mcg total) by mouth 2 (two) times daily. 60 capsule 11  . famotidine (PEPCID) 20 MG tablet TAKE ONE TABLET BY MOUTH ONCE DAILY 30 tablet 9  . hydroxypropyl methylcellulose / hypromellose (ISOPTO TEARS / GONIOVISC) 2.5 % ophthalmic solution Place 1 drop into both eyes as needed for dry eyes.    Marland Kitchen loratadine (CLARITIN) 10 MG tablet Take 10 mg by mouth daily.    . Multiple Vitamins-Minerals (PRESERVISION AREDS PO) Take 1 tablet by mouth 2 (two) times daily.    . rivaroxaban (XARELTO) 20 MG TABS tablet Take 1 tablet (20 mg total) by mouth daily. 90 tablet 3  . simvastatin (ZOCOR) 20 MG tablet TAKE ONE TABLET BY MOUTH AT BEDTIME 90 tablet 1  . sodium chloride (OCEAN) 0.65 % SOLN nasal spray Place 1 spray into both nostrils at bedtime as needed for congestion.    . triamcinolone ointment (KENALOG) 0.5 % Apply 1 application topically 2 (two) times daily as needed.     . trimethoprim-polymyxin b (POLYTRIM) ophthalmic solution Place 1 drop into the left eye See admin instructions. After eye injection, every 5 weeks, 1 drop 4 times daily in Left eye for 2 days     No facility-administered medications prior to visit.     ROS Review of Systems  Constitutional: Negative for activity change, appetite change, chills, fatigue and unexpected weight change.  HENT: Negative for congestion, mouth sores and sinus pressure.   Eyes: Negative for visual disturbance.  Respiratory: Negative for cough and chest tightness.   Gastrointestinal: Negative for abdominal pain  and nausea.  Genitourinary: Negative for difficulty urinating, frequency and vaginal pain.  Musculoskeletal: Negative for back pain and gait problem.  Skin: Negative for pallor and rash.  Neurological: Negative for dizziness, tremors, weakness, numbness and headaches.  Psychiatric/Behavioral: Negative for confusion, sleep disturbance and suicidal ideas.    Objective:  BP (!) 148/78   Pulse 60   Wt 121 lb (54.9 kg)   SpO2 99%   BMI 21.43 kg/m   BP Readings from Last 3 Encounters:  07/07/16 (!) 148/78  06/07/16 140/76  05/08/16 116/61    Wt Readings from Last 3 Encounters:  07/07/16 121 lb (54.9 kg)  06/07/16 122 lb 3.2 oz (55.4 kg)  05/07/16 120 lb (54.4 kg)    Physical Exam  Constitutional: She appears well-developed. No distress.  HENT:  Head: Normocephalic.  Right Ear: External ear normal.  Left Ear: External ear normal.  Nose: Nose normal.  Mouth/Throat: Oropharynx is clear and moist.  Eyes: Conjunctivae are normal. Pupils are equal, round, and reactive to light. Right eye exhibits no discharge. Left eye exhibits no discharge.  Neck: Normal range of motion. Neck supple. No JVD present. No tracheal deviation present. No thyromegaly present.  Cardiovascular: Normal rate, regular rhythm and normal heart sounds.   Pulmonary/Chest: No stridor. No respiratory distress. She has no wheezes.  Abdominal: Soft. Bowel sounds are normal. She exhibits no distension and no mass. There is no tenderness. There is no rebound  and no guarding.  Musculoskeletal: She exhibits no edema or tenderness.  Lymphadenopathy:    She has no cervical adenopathy.  Neurological: She displays normal reflexes. No cranial nerve deficit. She exhibits normal muscle tone. Coordination normal.  Skin: No rash noted. No erythema.  Psychiatric: She has a normal mood and affect. Her behavior is normal. Judgment and thought content normal.    Lab Results  Component Value Date   WBC 5.3 04/27/2016   HGB 11.9  (L) 04/27/2016   HCT 36.9 04/27/2016   PLT 255 04/27/2016   GLUCOSE 106 (H) 04/27/2016   CHOL 167 07/04/2015   TRIG 117.0 07/04/2015   HDL 65.50 07/04/2015   LDLCALC 78 07/04/2015   ALT 14 08/07/2015   AST 18 08/07/2015   NA 138 04/27/2016   K 4.2 04/27/2016   CL 103 04/27/2016   CREATININE 0.74 04/27/2016   BUN 15 04/27/2016   CO2 29 04/27/2016   TSH 1.733 08/07/2015   INR 1.41 08/07/2015    No results found.  Assessment & Plan:   There are no diagnoses linked to this encounter. I am having Ms. Tuberville maintain her loratadine, cholecalciferol, dofetilide, rivaroxaban, Multiple Vitamins-Minerals (PRESERVISION AREDS PO), famotidine, simvastatin, triamcinolone ointment, sodium chloride, trimethoprim-polymyxin b, and hydroxypropyl methylcellulose / hypromellose.  No orders of the defined types were placed in this encounter.    Follow-up: No Follow-up on file.  Walker Kehr, MD

## 2016-07-07 NOTE — Assessment & Plan Note (Signed)
On Simvastatin 

## 2016-07-07 NOTE — Assessment & Plan Note (Signed)
Dr Gwenlyn Found.  On Xarelto and Tycosyn Recurrent

## 2016-07-07 NOTE — Assessment & Plan Note (Signed)
Chronic  On Pepcid

## 2016-07-07 NOTE — Progress Notes (Signed)
Pre visit review using our clinic review tool, if applicable. No additional management support is needed unless otherwise documented below in the visit note. 

## 2016-07-28 ENCOUNTER — Encounter: Payer: Self-pay | Admitting: *Deleted

## 2016-08-10 NOTE — Progress Notes (Signed)
Electrophysiology Office Note   Date:  08/11/2016   ID:  Vicki [name], DOB November 17, 1946, MRN [id number]  PCP:  Walker Kehr, MD  Cardiologist:  Quay Burow Primary Electrophysiologist: Constance Haw, MD    Chief Complaint  Patient presents with  . Follow-up    PAROXYSMAL ATRIAL FIBRILLATION     History of Present Illness: Vicki [name] is a 70 y.o. female who presents today for electrophysiology evaluation.   She has a history of persistent atrial fibrillation, hyperlipidemia, and aortic insufficiency. She is on Xarelto for [facility] of 2.  AF ablation 05/07/16. Since that time, she is felt well. She went on a trip in June to [place] and was able to walk and keep up with the others on the trip without issue.  Today, she denies symptoms of palpitations, chest pain, shortness of breath, orthopnea, PND, lower extremity edema, claudication, dizziness, presyncope, syncope, bleeding, or neurologic sequela. The patient is tolerating medications without difficulties and is otherwise without complaint today.    Past Medical History:  Diagnosis Date  . Adenocarcinoma, breast ([place])   . Allergic rhinitis   . Anxiety   . Aortic insufficiency   . Contact dermatitis   . Diverticulosis of colon   . DJD (degenerative joint disease)   . GERD (gastroesophageal reflux disease)   . Hx of colonic polyps   . Hyperlipidemia   . Osteopenia   . Paroxysmal atrial fibrillation (HCC)    A. fib is now persistent.  . Visit for monitoring Tikosyn therapy 09/06/2015   Past Surgical History:  Procedure Laterality Date  . ABDOMINAL HYSTERECTOMY  1984   with 1 ovary removal  . BREAST LUMPECTOMY Left 08/2007    with node bx  . ELECTROPHYSIOLOGIC STUDY N/A 05/07/2016   Procedure: Atrial Fibrillation Ablation;  Surgeon: Arrie Borrelli Meredith Leeds, MD;  Location: [facility];  Service: Cardiovascular;  Laterality: N/A;     Current Outpatient Prescriptions  Medication Sig Dispense Refill  .  cholecalciferol (VITAMIN D) 1000 UNITS tablet Take 1,000 Units by mouth 2 (two) times daily.    Marland Kitchen dofetilide (TIKOSYN) 500 MCG capsule Take 1 capsule (500 mcg total) by mouth 2 (two) times daily. 60 capsule 11  . famotidine (PEPCID) 20 MG tablet TAKE ONE TABLET BY MOUTH ONCE DAILY 30 tablet 9  . hydroxypropyl methylcellulose / hypromellose (ISOPTO TEARS / GONIOVISC) 2.5 % ophthalmic solution Place 1 drop into both eyes as needed for dry eyes.    Marland Kitchen loratadine (CLARITIN) 10 MG tablet Take 10 mg by mouth daily.    . Multiple Vitamins-Minerals (PRESERVISION AREDS PO) Take 1 tablet by mouth 2 (two) times daily.    . rivaroxaban (XARELTO) 20 MG TABS tablet Take 1 tablet (20 mg total) by mouth daily. 90 tablet 3  . simvastatin (ZOCOR) 20 MG tablet TAKE ONE TABLET BY MOUTH AT BEDTIME 90 tablet 1  . sodium chloride (OCEAN) 0.65 % SOLN nasal spray Place 1 spray into both nostrils at bedtime as needed for congestion.    . triamcinolone ointment (KENALOG) 0.5 % Apply 1 application topically 2 (two) times daily as needed.     . trimethoprim-polymyxin b (POLYTRIM) ophthalmic solution Place 1 drop into the left eye See admin instructions. After eye injection, every 5 weeks, 1 drop 4 times daily in Left eye for 2 days     No current facility-administered medications for this visit.     Allergies:   Codeine   Social History:  The patient  reports that she quit smoking about 39 years ago. Her smoking use included Cigarettes. She has never used smokeless tobacco. She reports that she does not drink alcohol or use drugs.   Family History:  The patient's family history includes Clotting disorder (age of onset: 63) in her mother; Healthy in her brother; Heart attack in her father; Heart disease in her mother; Heart disease (age of onset: 19) in her father; Hypertension in her sister; Lymphoma (age of onset: 25) in her sister; Other (age of onset: 67) in her mother.    ROS:  Please see the history of present illness.    Otherwise, review of systems is positive none.   All other systems are reviewed and negative.    PHYSICAL EXAM: VS:  BP 138/72   Pulse (!) 59   Ht 5\' 3"  (1.6 m)   Wt 122 lb (55.3 kg)   BMI 21.61 kg/m  , BMI Body mass index is 21.61 kg/m. GEN: Well nourished, well developed, in no acute distress  HEENT: normal  Neck: no JVD, carotid bruits, or masses Cardiac: RRR; no murmurs, rubs, or gallops,no edema  Respiratory:  clear to auscultation bilaterally, normal work of breathing GI: soft, nontender, nondistended, + BS MS: no deformity or atrophy  Skin: warm and dry Neuro:  Strength and sensation are intact Psych: euthymic mood, full affect  EKG:  EKG is not ordered today. The ekg ordered today shows sinus rhythm, rate 59  Recent Labs: 04/27/2016: Magnesium 2.1 07/07/2016: BUN 16; Creatinine, Ser 0.68; Hemoglobin 13.4; Platelets 277.0; Potassium 5.3; Sodium 139; TSH 1.68    Lipid Panel     Component Value Date/Time   CHOL 167 07/04/2015 0956   TRIG 117.0 07/04/2015 0956   HDL 65.50 07/04/2015 0956   CHOLHDL 3 07/04/2015 0956   VLDL 23.4 07/04/2015 0956   LDLCALC 78 07/04/2015 0956     Wt Readings from Last 3 Encounters:  08/11/16 122 lb (55.3 kg)  07/07/16 121 lb (54.9 kg)  06/07/16 122 lb 3.2 oz (55.4 kg)      Other studies Reviewed: Additional studies/ records that were reviewed today include: 07/09/15 TTE Review of the above records today demonstrates: - Normal LV function; elevated LV filling pressure; mild AI; thickened MV with prolapse of the anterior leaflet; eccentric, posteriorly directed MR difficult to quantitate but appears to be mild (may be underestimated due to eccentric nature); mild TR, mildly elevated pulmonary pressures.    ASSESSMENT AND PLAN:  1.   PERSISTENT ATRIAL FIBRILLATION:  Patient was loaded on Dofetilide in September.  Today her EKG is unchanged. We Amar Sippel continue her  Dofetilide without any adjustments. She is also on  Xarelto  for a chads 2 vasc of 2.  AF ablation 05/07/16. Has had no recurrence since her ablation. She is on dofetilide, and wishes to stop at this time. We'll therefore hold this medication.  2. Hyperlipidemia: continue simvastatin  Current medicines are reviewed at length with the patient today.   The patient does not have concerns regarding her medicines.  The following changes were made today:  Stop dofetilide  Labs/ tests ordered today include:  No orders of the defined types were placed in this encounter.    Disposition:   FU with Shaunice Levitan 6  months  Signed, Ladavion Savitz Meredith Leeds, MD  08/11/2016 10:09 AM     [facility] [address] [place] [address] [phone] (office) [phone] (fax)

## 2016-08-11 ENCOUNTER — Encounter: Payer: Self-pay | Admitting: Cardiology

## 2016-08-11 ENCOUNTER — Ambulatory Visit (INDEPENDENT_AMBULATORY_CARE_PROVIDER_SITE_OTHER): Payer: PPO | Admitting: Cardiology

## 2016-08-11 VITALS — BP 138/72 | HR 59 | Ht 63.0 in | Wt 122.0 lb

## 2016-08-11 DIAGNOSIS — I481 Persistent atrial fibrillation: Secondary | ICD-10-CM | POA: Diagnosis not present

## 2016-08-11 DIAGNOSIS — I4819 Other persistent atrial fibrillation: Secondary | ICD-10-CM

## 2016-08-11 NOTE — Patient Instructions (Signed)
Medication Instructions:   Your physician has recommended you make the following change in your medication:  1) STOP Tikosyn  --- If you need a refill on your cardiac medications before your next appointment, please call your pharmacy. ---  Labwork:  None ordered  Testing/Procedures:  None ordered  Follow-Up:  Your physician wants you to follow-up in: 6 months with Dr. Curt Bears.  You will receive a reminder letter in the mail two months in advance. If you don't receive a letter, please call our office to schedule the follow-up appointment.  Thank you for choosing [facility]!!   Trinidad Curet, RN [phone]

## 2016-08-20 ENCOUNTER — Encounter (INDEPENDENT_AMBULATORY_CARE_PROVIDER_SITE_OTHER): Payer: PPO | Admitting: Ophthalmology

## 2016-08-20 DIAGNOSIS — H353221 Exudative age-related macular degeneration, left eye, with active choroidal neovascularization: Secondary | ICD-10-CM

## 2016-08-20 DIAGNOSIS — H353111 Nonexudative age-related macular degeneration, right eye, early dry stage: Secondary | ICD-10-CM

## 2016-08-20 DIAGNOSIS — I10 Essential (primary) hypertension: Secondary | ICD-10-CM | POA: Diagnosis not present

## 2016-08-20 DIAGNOSIS — H35033 Hypertensive retinopathy, bilateral: Secondary | ICD-10-CM

## 2016-08-20 DIAGNOSIS — H43813 Vitreous degeneration, bilateral: Secondary | ICD-10-CM | POA: Diagnosis not present

## 2016-09-24 ENCOUNTER — Other Ambulatory Visit: Payer: Self-pay | Admitting: Internal Medicine

## 2016-09-28 ENCOUNTER — Ambulatory Visit (INDEPENDENT_AMBULATORY_CARE_PROVIDER_SITE_OTHER): Payer: PPO | Admitting: General Practice

## 2016-09-28 DIAGNOSIS — Z23 Encounter for immunization: Secondary | ICD-10-CM | POA: Diagnosis not present

## 2016-09-28 DIAGNOSIS — Z2911 Encounter for prophylactic immunotherapy for respiratory syncytial virus (RSV): Secondary | ICD-10-CM

## 2016-09-30 ENCOUNTER — Other Ambulatory Visit: Payer: Self-pay | Admitting: Geriatric Medicine

## 2016-09-30 ENCOUNTER — Other Ambulatory Visit (INDEPENDENT_AMBULATORY_CARE_PROVIDER_SITE_OTHER): Payer: PPO

## 2016-09-30 ENCOUNTER — Telehealth: Payer: Self-pay | Admitting: Internal Medicine

## 2016-09-30 DIAGNOSIS — R300 Dysuria: Secondary | ICD-10-CM

## 2016-09-30 LAB — URINALYSIS, ROUTINE W REFLEX MICROSCOPIC
Bilirubin Urine: NEGATIVE
Hgb urine dipstick: NEGATIVE
Ketones, ur: NEGATIVE
Leukocytes, UA: NEGATIVE
Nitrite: NEGATIVE
SPECIFIC GRAVITY, URINE: 1.01 (ref 1.000–1.030)
Total Protein, Urine: NEGATIVE
URINE GLUCOSE: NEGATIVE
Urobilinogen, UA: 1 (ref 0.0–1.0)
pH: 7 (ref 5.0–8.0)

## 2016-09-30 NOTE — Telephone Encounter (Signed)
I called pt- her spouse states she is on her way to our office now. UA order placed.

## 2016-09-30 NOTE — Telephone Encounter (Signed)
OK UA Thx

## 2016-09-30 NOTE — Telephone Encounter (Signed)
Pt called stating she has symptom of UTI, she was wondering if she can come in and give urine sample to lab. Please advise.

## 2016-10-07 ENCOUNTER — Encounter (INDEPENDENT_AMBULATORY_CARE_PROVIDER_SITE_OTHER): Payer: PPO | Admitting: Ophthalmology

## 2016-10-13 ENCOUNTER — Encounter (INDEPENDENT_AMBULATORY_CARE_PROVIDER_SITE_OTHER): Payer: PPO | Admitting: Ophthalmology

## 2016-10-13 DIAGNOSIS — H2513 Age-related nuclear cataract, bilateral: Secondary | ICD-10-CM | POA: Diagnosis not present

## 2016-10-13 DIAGNOSIS — H353112 Nonexudative age-related macular degeneration, right eye, intermediate dry stage: Secondary | ICD-10-CM | POA: Diagnosis not present

## 2016-10-13 DIAGNOSIS — H43813 Vitreous degeneration, bilateral: Secondary | ICD-10-CM

## 2016-10-13 DIAGNOSIS — H35033 Hypertensive retinopathy, bilateral: Secondary | ICD-10-CM

## 2016-10-13 DIAGNOSIS — H353221 Exudative age-related macular degeneration, left eye, with active choroidal neovascularization: Secondary | ICD-10-CM

## 2016-10-13 DIAGNOSIS — I10 Essential (primary) hypertension: Secondary | ICD-10-CM | POA: Diagnosis not present

## 2016-10-21 DIAGNOSIS — Z01419 Encounter for gynecological examination (general) (routine) without abnormal findings: Secondary | ICD-10-CM | POA: Diagnosis not present

## 2016-10-31 ENCOUNTER — Other Ambulatory Visit (HOSPITAL_COMMUNITY): Payer: Self-pay | Admitting: Nurse Practitioner

## 2016-11-02 ENCOUNTER — Other Ambulatory Visit: Payer: Self-pay | Admitting: Internal Medicine

## 2016-11-02 DIAGNOSIS — Z1231 Encounter for screening mammogram for malignant neoplasm of breast: Secondary | ICD-10-CM

## 2016-12-08 ENCOUNTER — Ambulatory Visit
Admission: RE | Admit: 2016-12-08 | Discharge: 2016-12-08 | Disposition: A | Discharge status: Home / Self Care | Payer: PPO | Source: Ambulatory Visit | Attending: Internal Medicine | Admitting: Internal Medicine

## 2016-12-08 DIAGNOSIS — Z1231 Encounter for screening mammogram for malignant neoplasm of breast: Secondary | ICD-10-CM | POA: Diagnosis not present

## 2016-12-15 ENCOUNTER — Encounter (INDEPENDENT_AMBULATORY_CARE_PROVIDER_SITE_OTHER): Payer: PPO | Admitting: Ophthalmology

## 2016-12-15 DIAGNOSIS — H353111 Nonexudative age-related macular degeneration, right eye, early dry stage: Secondary | ICD-10-CM | POA: Diagnosis not present

## 2016-12-15 DIAGNOSIS — I10 Essential (primary) hypertension: Secondary | ICD-10-CM

## 2016-12-15 DIAGNOSIS — H43813 Vitreous degeneration, bilateral: Secondary | ICD-10-CM

## 2016-12-15 DIAGNOSIS — H353221 Exudative age-related macular degeneration, left eye, with active choroidal neovascularization: Secondary | ICD-10-CM

## 2016-12-15 DIAGNOSIS — H35033 Hypertensive retinopathy, bilateral: Secondary | ICD-10-CM

## 2017-01-06 ENCOUNTER — Ambulatory Visit (INDEPENDENT_AMBULATORY_CARE_PROVIDER_SITE_OTHER): Payer: PPO | Admitting: Internal Medicine

## 2017-01-06 ENCOUNTER — Other Ambulatory Visit (INDEPENDENT_AMBULATORY_CARE_PROVIDER_SITE_OTHER): Payer: PPO

## 2017-01-06 ENCOUNTER — Encounter: Payer: Self-pay | Admitting: Internal Medicine

## 2017-01-06 VITALS — BP 138/66 | HR 60 | Temp 98.0°F | Resp 16 | Ht 63.0 in | Wt 115.8 lb

## 2017-01-06 DIAGNOSIS — E785 Hyperlipidemia, unspecified: Secondary | ICD-10-CM | POA: Diagnosis not present

## 2017-01-06 DIAGNOSIS — Z0000 Encounter for general adult medical examination without abnormal findings: Secondary | ICD-10-CM

## 2017-01-06 DIAGNOSIS — I480 Paroxysmal atrial fibrillation: Secondary | ICD-10-CM

## 2017-01-06 DIAGNOSIS — R634 Abnormal weight loss: Secondary | ICD-10-CM | POA: Insufficient documentation

## 2017-01-06 DIAGNOSIS — M8580 Other specified disorders of bone density and structure, unspecified site: Secondary | ICD-10-CM

## 2017-01-06 DIAGNOSIS — Z7289 Other problems related to lifestyle: Secondary | ICD-10-CM | POA: Diagnosis not present

## 2017-01-06 DIAGNOSIS — R911 Solitary pulmonary nodule: Secondary | ICD-10-CM

## 2017-01-06 DIAGNOSIS — F411 Generalized anxiety disorder: Secondary | ICD-10-CM

## 2017-01-06 LAB — HEPATIC FUNCTION PANEL
ALK PHOS: 115 U/L (ref 39–117)
ALT: 31 U/L (ref 0–35)
AST: <3 U/L (ref 0–37)
Albumin: 4.1 g/dL (ref 3.5–5.2)
BILIRUBIN DIRECT: 0.1 mg/dL (ref 0.0–0.3)
BILIRUBIN TOTAL: 0.5 mg/dL (ref 0.2–1.2)
Total Protein: 7.1 g/dL (ref 6.0–8.3)

## 2017-01-06 LAB — URINALYSIS
Bilirubin Urine: NEGATIVE
HGB URINE DIPSTICK: NEGATIVE
Ketones, ur: NEGATIVE
Leukocytes, UA: NEGATIVE
NITRITE: NEGATIVE
Specific Gravity, Urine: 1.015 (ref 1.000–1.030)
TOTAL PROTEIN, URINE-UPE24: NEGATIVE
Urine Glucose: NEGATIVE
Urobilinogen, UA: 0.2 (ref 0.0–1.0)
pH: 6.5 (ref 5.0–8.0)

## 2017-01-06 LAB — CBC WITH DIFFERENTIAL/PLATELET
BASOS PCT: 0.6 % (ref 0.0–3.0)
Basophils Absolute: 0 10*3/uL (ref 0.0–0.1)
EOS PCT: 0 % (ref 0.0–5.0)
Eosinophils Absolute: 0 10*3/uL (ref 0.0–0.7)
HCT: 40.2 % (ref 36.0–46.0)
HEMOGLOBIN: 13.4 g/dL (ref 12.0–15.0)
LYMPHS ABS: 1.9 10*3/uL (ref 0.7–4.0)
Lymphocytes Relative: 25.6 % (ref 12.0–46.0)
MCHC: 33.3 g/dL (ref 30.0–36.0)
MCV: 92.8 fl (ref 78.0–100.0)
MONO ABS: 0.9 10*3/uL (ref 0.1–1.0)
Monocytes Relative: 11.9 % (ref 3.0–12.0)
NEUTROS ABS: 4.7 10*3/uL (ref 1.4–7.7)
NEUTROS PCT: 61.9 % (ref 43.0–77.0)
PLATELETS: 322 10*3/uL (ref 150.0–400.0)
RBC: 4.33 Mil/uL (ref 3.87–5.11)
RDW: 12.7 % (ref 11.5–15.5)
WBC: 7.6 10*3/uL (ref 4.0–10.5)

## 2017-01-06 LAB — BASIC METABOLIC PANEL
BUN: 13 mg/dL (ref 6–23)
CHLORIDE: 104 meq/L (ref 96–112)
CO2: 33 mEq/L — ABNORMAL HIGH (ref 19–32)
CREATININE: 0.65 mg/dL (ref 0.40–1.20)
Calcium: 9.6 mg/dL (ref 8.4–10.5)
GFR: 95.69 mL/min (ref >60.00)
GLUCOSE: 107 mg/dL — AB (ref 70–99)
Potassium: 5.2 mEq/L — ABNORMAL HIGH (ref 3.5–5.1)
Sodium: 140 mEq/L (ref 135–145)

## 2017-01-06 LAB — LIPID PANEL
CHOLESTEROL: 153 mg/dL (ref 0–200)
HDL: 53 mg/dL (ref >39.00)
LDL CALC: 77 mg/dL (ref 0–99)
NonHDL: 100.12
Total CHOL/HDL Ratio: 3
Triglycerides: 115 mg/dL (ref 0.0–149.0)
VLDL: 23 mg/dL (ref 0.0–40.0)

## 2017-01-06 LAB — HEPATITIS C ANTIBODY: HCV Ab: NEGATIVE

## 2017-01-06 LAB — TSH: TSH: 1.49 u[IU]/mL (ref 0.35–4.50)

## 2017-01-06 NOTE — Progress Notes (Signed)
Subjective:  Patient ID: Vicki [name], female    DOB: [birth year]  Age: 71 y.o. MRN: [id number]  CC: No chief complaint on file.   HPI Vicki [name] presents for a well exam. C/o wt loss in 1 year  Outpatient Medications Prior to Visit  Medication Sig Dispense Refill  . cholecalciferol (VITAMIN D) 1000 UNITS tablet Take 1,000 Units by mouth 2 (two) times daily.    . famotidine (PEPCID) 20 MG tablet TAKE ONE TABLET BY MOUTH ONCE DAILY 30 tablet 9  . hydroxypropyl methylcellulose / hypromellose (ISOPTO TEARS / GONIOVISC) 2.5 % ophthalmic solution Place 1 drop into both eyes as needed for dry eyes.    Marland Kitchen loratadine (CLARITIN) 10 MG tablet Take 10 mg by mouth daily.    . Multiple Vitamins-Minerals (PRESERVISION AREDS PO) Take 1 tablet by mouth 2 (two) times daily.    . simvastatin (ZOCOR) 20 MG tablet TAKE ONE TABLET BY MOUTH AT BEDTIME 90 tablet 1  . sodium chloride (OCEAN) 0.65 % SOLN nasal spray Place 1 spray into both nostrils at bedtime as needed for congestion.    . triamcinolone ointment (KENALOG) 0.5 % Apply 1 application topically 2 (two) times daily as needed.     . trimethoprim-polymyxin b (POLYTRIM) ophthalmic solution Place 1 drop into the left eye See admin instructions. After eye injection, every 5 weeks, 1 drop 4 times daily in Left eye for 2 days    . XARELTO 20 MG TABS tablet TAKE ONE TABLET BY MOUTH ONCE DAILY 90 tablet 3   No facility-administered medications prior to visit.     ROS Review of Systems  Constitutional: Positive for unexpected weight change. Negative for activity change, appetite change, chills and fatigue.  HENT: Negative for congestion, mouth sores and sinus pressure.   Eyes: Negative for visual disturbance.  Respiratory: Negative for cough and chest tightness.   Gastrointestinal: Negative for abdominal pain and nausea.  Genitourinary: Negative for difficulty urinating, frequency and vaginal pain.  Musculoskeletal: Negative for back pain and  gait problem.  Skin: Negative for pallor and rash.  Neurological: Negative for dizziness, tremors, weakness, numbness and headaches.  Psychiatric/Behavioral: Negative for confusion, sleep disturbance and suicidal ideas.    Objective:  BP 138/66   Pulse 60   Temp 98 F (36.7 C) (Oral)   Resp 16   Ht 5\' 3"  (1.6 m)   Wt 115 lb 12.8 oz (52.5 kg)   SpO2 98%   BMI 20.51 kg/m   BP Readings from Last 3 Encounters:  01/06/17 138/66  08/11/16 138/72  07/07/16 (!) 148/78    Wt Readings from Last 3 Encounters:  01/06/17 115 lb 12.8 oz (52.5 kg)  08/11/16 122 lb (55.3 kg)  07/07/16 121 lb (54.9 kg)    Physical Exam  Constitutional: She appears well-developed. No distress.  HENT:  Head: Normocephalic.  Right Ear: External ear normal.  Left Ear: External ear normal.  Nose: Nose normal.  Mouth/Throat: Oropharynx is clear and moist.  Eyes: Conjunctivae are normal. Pupils are equal, round, and reactive to light. Right eye exhibits no discharge. Left eye exhibits no discharge.  Neck: Normal range of motion. Neck supple. No JVD present. No tracheal deviation present. No thyromegaly present.  Cardiovascular: Normal rate, regular rhythm and normal heart sounds.   Pulmonary/Chest: No stridor. No respiratory distress. She has no wheezes.  Abdominal: Soft. Bowel sounds are normal. She exhibits no distension and no mass. There is no tenderness. There is no rebound and no  guarding.  Musculoskeletal: She exhibits no edema or tenderness.  Lymphadenopathy:    She has no cervical adenopathy.  Neurological: She displays normal reflexes. No cranial nerve deficit. She exhibits normal muscle tone. Coordination normal.  Skin: No rash noted. No erythema.  Psychiatric: She has a normal mood and affect. Her behavior is normal. Judgment and thought content normal.    Lab Results  Component Value Date   WBC 6.7 07/07/2016   HGB 13.4 07/07/2016   HCT 39.4 07/07/2016   PLT 277.0 07/07/2016   GLUCOSE 79  07/07/2016   CHOL 167 07/04/2015   TRIG 117.0 07/04/2015   HDL 65.50 07/04/2015   LDLCALC 78 07/04/2015   ALT 14 08/07/2015   AST 18 08/07/2015   NA 139 07/07/2016   K 5.3 (H) 07/07/2016   CL 102 07/07/2016   CREATININE 0.68 07/07/2016   BUN 16 07/07/2016   CO2 26 07/07/2016   TSH 1.68 07/07/2016   INR 1.41 08/07/2015    Mm Screening Breast Tomo Bilateral  Result Date: 12/09/2016 CLINICAL DATA:  Screening. EXAM: 2D DIGITAL SCREENING BILATERAL MAMMOGRAM WITH CAD AND ADJUNCT TOMO COMPARISON:  Previous exam(s). ACR Breast Density Category c: The breast tissue is heterogeneously dense, which may obscure small masses. FINDINGS: There are no findings suspicious for malignancy. Images were processed with CAD. IMPRESSION: No mammographic evidence of malignancy. A result letter of this screening mammogram will be mailed directly to the patient. RECOMMENDATION: Screening mammogram in one year. (Code:SM-B-01Y) BI-RADS CATEGORY  1: Negative. Electronically Signed   By: Lovey Newcomer M.D.   On: 12/09/2016 11:13    Assessment & Plan:   There are no diagnoses linked to this encounter. I am having [name] maintain her loratadine, cholecalciferol, Multiple Vitamins-Minerals (PRESERVISION AREDS PO), famotidine, triamcinolone ointment, sodium chloride, trimethoprim-polymyxin b, hydroxypropyl methylcellulose / hypromellose, simvastatin, and XARELTO.  No orders of the defined types were placed in this encounter.    Follow-up: No Follow-up on file.  Walker Kehr, MD

## 2017-01-06 NOTE — Progress Notes (Signed)
Pre visit review using our clinic review tool, if applicable. No additional management support is needed unless otherwise documented below in the visit note. 

## 2017-01-06 NOTE — Assessment & Plan Note (Signed)
Vit D BDS 

## 2017-01-06 NOTE — Assessment & Plan Note (Signed)
Repeat CT chest

## 2017-01-06 NOTE — Patient Instructions (Signed)

## 2017-01-06 NOTE — Assessment & Plan Note (Signed)
On Xarelto 

## 2017-01-06 NOTE — Assessment & Plan Note (Signed)
Labs

## 2017-01-06 NOTE — Assessment & Plan Note (Signed)
Here for medicare wellness/physical  Diet: heart healthy  Physical activity: not sedentary  Depression/mood screen: negative  Hearing: intact to whispered voice  Visual acuity: grossly normal, performs frequent eye exam for MD ADLs: capable  Fall risk: low to none  Home safety: good  Cognitive evaluation: intact to orientation, naming, recall and repetition  EOL planning: adv directives, full code/ I agree  I have personally reviewed and have noted  1. The patient's medical, surgical and social history  2. Their use of alcohol, tobacco or illicit drugs  3. Their current medications and supplements  4. The patient's functional ability including ADL's, fall risks, home safety risks and hearing or visual impairment.  5. Diet and physical activities  6. Evidence for depression or mood disorders 7. The roster of all physicians providing medical care to patient - is listed in the Snapshot section of the chart and reviewed today.    Today patient counseled on age appropriate routine health concerns for screening and prevention, each reviewed and up to date or declined. Immunizations reviewed and up to date or declined. Labs ordered and reviewed. Risk factors for depression reviewed and negative. Hearing function and visual acuity are intact. ADLs screened and addressed as needed. Functional ability and level of safety reviewed and appropriate. Education, counseling and referrals performed based on assessed risks today. Patient provided with a copy of personalized plan for preventive services.

## 2017-01-24 ENCOUNTER — Other Ambulatory Visit: Payer: Self-pay | Admitting: Cardiovascular Disease

## 2017-01-27 ENCOUNTER — Ambulatory Visit (INDEPENDENT_AMBULATORY_CARE_PROVIDER_SITE_OTHER)
Admission: RE | Admit: 2017-01-27 | Discharge: 2017-01-27 | Disposition: A | Discharge status: Home / Self Care | Payer: PPO | Source: Ambulatory Visit | Attending: Internal Medicine | Admitting: Internal Medicine

## 2017-01-27 DIAGNOSIS — R911 Solitary pulmonary nodule: Secondary | ICD-10-CM | POA: Diagnosis not present

## 2017-02-02 ENCOUNTER — Encounter (INDEPENDENT_AMBULATORY_CARE_PROVIDER_SITE_OTHER): Payer: PPO | Admitting: Ophthalmology

## 2017-02-02 DIAGNOSIS — H353111 Nonexudative age-related macular degeneration, right eye, early dry stage: Secondary | ICD-10-CM | POA: Diagnosis not present

## 2017-02-02 DIAGNOSIS — H35033 Hypertensive retinopathy, bilateral: Secondary | ICD-10-CM | POA: Diagnosis not present

## 2017-02-02 DIAGNOSIS — H2513 Age-related nuclear cataract, bilateral: Secondary | ICD-10-CM

## 2017-02-02 DIAGNOSIS — H43813 Vitreous degeneration, bilateral: Secondary | ICD-10-CM

## 2017-02-02 DIAGNOSIS — H353221 Exudative age-related macular degeneration, left eye, with active choroidal neovascularization: Secondary | ICD-10-CM

## 2017-02-02 DIAGNOSIS — I10 Essential (primary) hypertension: Secondary | ICD-10-CM

## 2017-02-03 ENCOUNTER — Encounter: Payer: Self-pay | Admitting: Cardiology

## 2017-02-09 ENCOUNTER — Encounter: Payer: Self-pay | Admitting: Cardiology

## 2017-02-09 ENCOUNTER — Ambulatory Visit (INDEPENDENT_AMBULATORY_CARE_PROVIDER_SITE_OTHER): Payer: PPO | Admitting: Cardiology

## 2017-02-09 VITALS — BP 132/68 | HR 60 | Ht 63.0 in | Wt 118.8 lb

## 2017-02-09 DIAGNOSIS — I481 Persistent atrial fibrillation: Secondary | ICD-10-CM | POA: Diagnosis not present

## 2017-02-09 DIAGNOSIS — I4819 Other persistent atrial fibrillation: Secondary | ICD-10-CM

## 2017-02-09 NOTE — Progress Notes (Signed)
Electrophysiology Office Note   Date:  02/09/2017   ID:  Vicki [name], DOB 31-Mar-1946, MRN [id number]  PCP:  Vicki Kehr, MD  Cardiologist:  Quay Burow Primary Electrophysiologist: Vicki Haw, MD    Chief Complaint  Patient presents with  . Follow-up    Persistent Afib     History of Present Illness: Vicki [name] is a 71 y.o. female who presents today for electrophysiology evaluation.   She has a history of persistent atrial fibrillation, hyperlipidemia, and aortic insufficiency. She is on Xarelto for [facility] of 2.  AF ablation 05/07/16.   Today, she denies symptoms of palpitations, chest pain, shortness of breath, orthopnea, PND, lower extremity edema, claudication, dizziness, presyncope, syncope, bleeding, or neurologic sequela. The patient is tolerating medications without difficulties and is otherwise without complaint today.    Past Medical History:  Diagnosis Date  . Adenocarcinoma, breast ([place])   . Allergic rhinitis   . Anxiety   . Aortic insufficiency   . Contact dermatitis   . Diverticulosis of colon   . DJD (degenerative joint disease)   . GERD (gastroesophageal reflux disease)   . Hx of colonic polyps   . Hyperlipidemia   . Osteopenia   . Paroxysmal atrial fibrillation (HCC)    A. fib is now persistent.  . Visit for monitoring Tikosyn therapy 09/06/2015   Past Surgical History:  Procedure Laterality Date  . ABDOMINAL HYSTERECTOMY  1984   with 1 ovary removal  . BREAST LUMPECTOMY Left 08/2007    with node bx  . ELECTROPHYSIOLOGIC STUDY N/A 05/07/2016   Procedure: Atrial Fibrillation Ablation;  Surgeon: Vicki Swearingin Meredith Leeds, MD;  Location: [place] [facility];  Service: Cardiovascular;  Laterality: N/A;     Current Outpatient Prescriptions  Medication Sig Dispense Refill  . cholecalciferol (VITAMIN D) 1000 UNITS tablet Take 1,000 Units by mouth 2 (two) times daily.    . famotidine (PEPCID) 20 MG tablet TAKE ONE TABLET BY MOUTH ONCE  DAILY 30 tablet 1  . hydroxypropyl methylcellulose / hypromellose (ISOPTO TEARS / GONIOVISC) 2.5 % ophthalmic solution Place 1 drop into both eyes as needed for dry eyes.    Marland Kitchen loratadine (CLARITIN) 10 MG tablet Take 10 mg by mouth daily.    . simvastatin (ZOCOR) 20 MG tablet TAKE ONE TABLET BY MOUTH AT BEDTIME 90 tablet 1  . sodium chloride (OCEAN) 0.65 % SOLN nasal spray Place 1 spray into both nostrils at bedtime as needed for congestion.    . triamcinolone ointment (KENALOG) 0.5 % Apply 1 application topically 2 (two) times daily as needed.     . trimethoprim-polymyxin b (POLYTRIM) ophthalmic solution Place 1 drop into the left eye See admin instructions. After eye injection, every 5 weeks, 1 drop 4 times daily in Left eye for 2 days    . XARELTO 20 MG TABS tablet TAKE ONE TABLET BY MOUTH ONCE DAILY 90 tablet 3   No current facility-administered medications for this visit.     Allergies:   Codeine   Social History:  The patient  reports that she quit smoking about 40 years ago. Her smoking use included Cigarettes. She has never used smokeless tobacco. She reports that she does not drink alcohol or use drugs.   Family History:  The patient's family history includes Clotting disorder (age of onset: 18) in her mother; Healthy in her brother; Heart attack in her father; Heart disease in her mother; Heart disease (age of onset: 5) in her father; Hypertension in  her sister; Lymphoma (age of onset: 60) in her sister; Other (age of onset: 58) in her mother.    ROS:  Please see the history of present illness.   Otherwise, review of systems is positive for none.   All other systems are reviewed and negative.    PHYSICAL EXAM: VS:  BP 132/68   Pulse 60   Ht 5\' 3"  (1.6 m)   Wt 118 lb 12.8 oz (53.9 kg)   BMI 21.04 kg/m  , BMI Body mass index is 21.04 kg/m. GEN: Well nourished, well developed, in no acute distress  HEENT: normal  Neck: no JVD, carotid bruits, or masses Cardiac: RRR; no  murmurs, rubs, or gallops,no edema  Respiratory:  clear to auscultation bilaterally, normal work of breathing GI: soft, nontender, nondistended, + BS MS: no deformity or atrophy  Skin: warm and dry Neuro:  Strength and sensation are intact Psych: euthymic mood, full affect  EKG:  EKG is ordered today. The ekg ordered today shows sinus rhythm, rate 60, nonspecific T wave changes  Recent Labs: 04/27/2016: Magnesium 2.1 01/06/2017: ALT 31; BUN 13; Creatinine, Ser 0.65; Hemoglobin 13.4; Platelets 322.0; Potassium 5.2; Sodium 140; TSH 1.49    Lipid Panel     Component Value Date/Time   CHOL 153 01/06/2017 0913   TRIG 115.0 01/06/2017 0913   HDL 53.00 01/06/2017 0913   CHOLHDL 3 01/06/2017 0913   VLDL 23.0 01/06/2017 0913   LDLCALC 77 01/06/2017 0913     Wt Readings from Last 3 Encounters:  02/09/17 118 lb 12.8 oz (53.9 kg)  01/06/17 115 lb 12.8 oz (52.5 kg)  08/11/16 122 lb (55.3 kg)      Other studies Reviewed: Additional studies/ records that were reviewed today include: 07/09/15 TTE Review of the above records today demonstrates: - Normal LV function; elevated LV filling pressure; mild AI; thickened MV with prolapse of the anterior leaflet; eccentric, posteriorly directed MR difficult to quantitate but appears to be mild (may be underestimated due to eccentric nature); mild TR, mildly elevated pulmonary pressures.    ASSESSMENT AND PLAN:  1.   PERSISTENT ATRIAL FIBRILLATION:  She is on  Xarelto for a chads 2 vasc of 2.  AF ablation 05/07/16. Tikosyn stopped at the last visit. Remains in atrial fibrillation, no changes needed today.  This patients CHA2DS2-VASc Score and unadjusted Ischemic Stroke Rate (% per year) is equal to 2.2 % stroke rate/year from a score of 2  Above score calculated as 1 point each if present [CHF, HTN, DM, Vascular=MI/PAD/Aortic Plaque, Age if 65-74, or Female] Above score calculated as 2 points each if present [Age > 75, or  Stroke/TIA/TE]  2. Hyperlipidemia: continue simvastatin  Current medicines are reviewed at length with the patient today.   The patient does not have concerns regarding her medicines.  The following changes were made today:  Stop dofetilide  Labs/ tests ordered today include:  Orders Placed This Encounter  Procedures  . EKG 12-Lead     Disposition:   FU with Shaivi Rothschild 6  months  Signed, Gaetan Spieker Meredith Leeds, MD  02/09/2017 10:09 AM     [facility] [address] [place] [address] [phone] (office) [phone] (fax)

## 2017-02-09 NOTE — Patient Instructions (Signed)

## 2017-03-20 ENCOUNTER — Other Ambulatory Visit: Payer: Self-pay | Admitting: Internal Medicine

## 2017-03-23 ENCOUNTER — Encounter (INDEPENDENT_AMBULATORY_CARE_PROVIDER_SITE_OTHER): Payer: PPO | Admitting: Ophthalmology

## 2017-03-23 DIAGNOSIS — H353111 Nonexudative age-related macular degeneration, right eye, early dry stage: Secondary | ICD-10-CM

## 2017-03-23 DIAGNOSIS — H2513 Age-related nuclear cataract, bilateral: Secondary | ICD-10-CM

## 2017-03-23 DIAGNOSIS — H43813 Vitreous degeneration, bilateral: Secondary | ICD-10-CM | POA: Diagnosis not present

## 2017-03-23 DIAGNOSIS — H35033 Hypertensive retinopathy, bilateral: Secondary | ICD-10-CM

## 2017-03-23 DIAGNOSIS — I10 Essential (primary) hypertension: Secondary | ICD-10-CM

## 2017-03-23 DIAGNOSIS — H353221 Exudative age-related macular degeneration, left eye, with active choroidal neovascularization: Secondary | ICD-10-CM

## 2017-03-24 ENCOUNTER — Encounter: Payer: Self-pay | Admitting: Internal Medicine

## 2017-03-24 ENCOUNTER — Ambulatory Visit (INDEPENDENT_AMBULATORY_CARE_PROVIDER_SITE_OTHER): Payer: PPO | Admitting: Internal Medicine

## 2017-03-24 ENCOUNTER — Other Ambulatory Visit: Payer: Self-pay | Admitting: Internal Medicine

## 2017-03-24 VITALS — BP 140/78 | HR 64 | Temp 97.9°F | Ht 63.0 in | Wt 118.1 lb

## 2017-03-24 DIAGNOSIS — D485 Neoplasm of uncertain behavior of skin: Secondary | ICD-10-CM

## 2017-03-24 NOTE — Assessment & Plan Note (Signed)
?  irritated SK Options discussed - will remove See procedure

## 2017-03-24 NOTE — Progress Notes (Signed)
Pre visit review using our clinic review tool, if applicable. No additional management support is needed unless otherwise documented below in the visit note. 

## 2017-03-24 NOTE — Progress Notes (Signed)
Subjective:  Patient ID: Vicki [name], female    DOB: 09-Jul-1946  Age: 71 y.o. MRN: [id number]  CC: No chief complaint on file.   HPI Vicki [name] presents for irritated skin mole on R neck  Outpatient Medications Prior to Visit  Medication Sig Dispense Refill  . cholecalciferol (VITAMIN D) 1000 UNITS tablet Take 1,000 Units by mouth 2 (two) times daily.    . famotidine (PEPCID) 20 MG tablet TAKE ONE TABLET BY MOUTH ONCE DAILY 30 tablet 1  . hydroxypropyl methylcellulose / hypromellose (ISOPTO TEARS / GONIOVISC) 2.5 % ophthalmic solution Place 1 drop into both eyes as needed for dry eyes.    Marland Kitchen loratadine (CLARITIN) 10 MG tablet Take 10 mg by mouth daily.    . simvastatin (ZOCOR) 20 MG tablet TAKE ONE TABLET BY MOUTH AT BEDTIME 90 tablet 1  . sodium chloride (OCEAN) 0.65 % SOLN nasal spray Place 1 spray into both nostrils at bedtime as needed for congestion.    . triamcinolone ointment (KENALOG) 0.5 % Apply 1 application topically 2 (two) times daily as needed.     . trimethoprim-polymyxin b (POLYTRIM) ophthalmic solution Place 1 drop into the left eye See admin instructions. After eye injection, every 5 weeks, 1 drop 4 times daily in Left eye for 2 days    . XARELTO 20 MG TABS tablet TAKE ONE TABLET BY MOUTH ONCE DAILY 90 tablet 3   No facility-administered medications prior to visit.     ROS Review of Systems  Constitutional: Negative for fever.  Gastrointestinal: Negative for abdominal pain.  Skin: Positive for color change. Negative for pallor, rash and wound.    Objective:  BP 140/78 (BP Location: Right Arm, Patient Position: Sitting, Cuff Size: Normal)   Pulse 64   Temp 97.9 F (36.6 C) (Oral)   Ht 5\' 3"  (1.6 m)   Wt 118 lb 1.3 oz (53.6 kg)   SpO2 99%   BMI 20.92 kg/m   BP Readings from Last 3 Encounters:  03/24/17 140/78  02/09/17 132/68  01/06/17 138/66    Wt Readings from Last 3 Encounters:  03/24/17 118 lb 1.3 oz (53.6 kg)  02/09/17 118 lb 12.8  oz (53.9 kg)  01/06/17 115 lb 12.8 oz (52.5 kg)    Physical Exam  Constitutional: She appears well-developed and well-nourished. No distress.  Cardiovascular: Normal rate.  Exam reveals no gallop.   Pulmonary/Chest: She has no wheezes.  Musculoskeletal: She exhibits no tenderness.  Skin: There is erythema.  irritated lesion on R neck    Procedure Note :     Procedure :  Skin biopsy   Indication:  Changing mole (s ),  Suspicious lesion(s)   Risks including unsuccessful procedure , bleeding, infection, bruising, scar, a need for another complete procedure and others were explained to the patient in detail as well as the benefits. Informed consent was obtained.  The patient was placed in a decubitus position.  Lesion #1 on   R neck  measuring   11x6  mm   Skin over lesion #1  was prepped with Betadine and alcohol  and anesthetized with 1 cc of 2% lidocaine and epinephrine, using a 25-gauge 1 inch needle.  Shave biopsy with a sterile Dermablade was carried out in the usual fashion. Hyfrecator was used to destroy the rest of the lesion potentially left behind and for hemostasis. Band-Aid was applied with antibiotic ointment.      Tolerated well. Complications none.  Lab Results  Component Value Date   WBC 7.6 01/06/2017   HGB 13.4 01/06/2017   HCT 40.2 01/06/2017   PLT 322.0 01/06/2017   GLUCOSE 107 (H) 01/06/2017   CHOL 153 01/06/2017   TRIG 115.0 01/06/2017   HDL 53.00 01/06/2017   LDLCALC 77 01/06/2017   ALT 31 01/06/2017   AST <3 01/06/2017   NA 140 01/06/2017   K 5.2 (H) 01/06/2017   CL 104 01/06/2017   CREATININE 0.65 01/06/2017   BUN 13 01/06/2017   CO2 33 (H) 01/06/2017   TSH 1.49 01/06/2017   INR 1.41 08/07/2015    Ct Chest Wo Contrast  Result Date: 01/27/2017 CLINICAL DATA:  Follow-up lung nodule EXAM: CT CHEST WITHOUT CONTRAST TECHNIQUE: Multidetector CT imaging of the chest was performed following the standard protocol without IV contrast.  COMPARISON:  01/28/2016 FINDINGS: Cardiovascular: Atherosclerotic calcifications of thoracic aorta and coronary arteries. No aortic aneurysm. Heart size within normal limits. No pericardial effusion. Mediastinum/Nodes: No mediastinal hematoma or adenopathy. No hilar adenopathy is noted on this unenhanced scan. Central airways are patent. Lungs/Pleura: Images of the lung parenchyma shows no infiltrate or pulmonary edema. No emphysema. No bronchiectasis. No bronchitic changes. No fibrotic changes. Axial image 49 Stable 2 mm nodule in posterior aspect of the right upper lobe. No new pulmonary nodules are noted. Upper Abdomen: The visualized upper abdomen shows atherosclerotic calcifications of abdominal aorta. No adrenal gland mass. Visualized unenhanced liver spleen and tail of the pancreas is unremarkable. Musculoskeletal: No destructive bony lesions are noted. Sagittal images of the spine shows diffuse osteopenia. Degenerative changes upper and mid thoracic spine. IMPRESSION: 1. No infiltrate or pulmonary edema. No mediastinal or hilar adenopathy. 2. Stable 2 mm nodule posterior aspect of the right upper lobe. No new pulmonary nodules are noted. No follow-up needed if patient is low-risk. Non-contrast chest CT can be considered in 12 months if patient is high-risk. This recommendation follows the consensus statement: Guidelines for Management of Incidental Pulmonary Nodules Detected on CT Images: From the Fleischner Society 2017; Radiology 2017; 284:228-243. 3. Mild degenerative changes thoracic spine. 4. Atherosclerotic calcifications of thoracic aorta and coronary arteries. Electronically Signed   By: Lahoma Crocker M.D.   On: 01/27/2017 11:36    Assessment & Plan:   Diagnoses and all orders for this visit:  Neoplasm of uncertain behavior of skin -     Dermatology pathology; Future   I am having Ms. Endres maintain her loratadine, cholecalciferol, triamcinolone ointment, sodium chloride,  trimethoprim-polymyxin b, hydroxypropyl methylcellulose / hypromellose, XARELTO, famotidine, and simvastatin.  No orders of the defined types were placed in this encounter.    Follow-up: No Follow-up on file.  Walker Kehr, MD

## 2017-03-26 ENCOUNTER — Telehealth: Payer: Self-pay | Admitting: Internal Medicine

## 2017-03-26 NOTE — Telephone Encounter (Signed)
[name], Please inform the patient that her skin bx was benign Thanks, AP  

## 2017-03-28 NOTE — Telephone Encounter (Signed)
Pt.notified

## 2017-03-31 ENCOUNTER — Encounter: Payer: Self-pay | Admitting: Internal Medicine

## 2017-03-31 LAB — DERMATOLOGY PATHOLOGY

## 2017-03-31 NOTE — Progress Notes (Signed)
Abstracted result and sent to scan  

## 2017-04-23 ENCOUNTER — Other Ambulatory Visit: Payer: Self-pay | Admitting: Cardiovascular Disease

## 2017-04-25 NOTE — Telephone Encounter (Signed)
Rx request sent to pharmacy.  

## 2017-05-11 ENCOUNTER — Encounter (INDEPENDENT_AMBULATORY_CARE_PROVIDER_SITE_OTHER): Payer: PPO | Admitting: Ophthalmology

## 2017-05-11 DIAGNOSIS — H35033 Hypertensive retinopathy, bilateral: Secondary | ICD-10-CM | POA: Diagnosis not present

## 2017-05-11 DIAGNOSIS — I10 Essential (primary) hypertension: Secondary | ICD-10-CM | POA: Diagnosis not present

## 2017-05-11 DIAGNOSIS — H353221 Exudative age-related macular degeneration, left eye, with active choroidal neovascularization: Secondary | ICD-10-CM | POA: Diagnosis not present

## 2017-05-11 DIAGNOSIS — H353112 Nonexudative age-related macular degeneration, right eye, intermediate dry stage: Secondary | ICD-10-CM

## 2017-05-11 DIAGNOSIS — H43813 Vitreous degeneration, bilateral: Secondary | ICD-10-CM

## 2017-05-24 ENCOUNTER — Other Ambulatory Visit: Payer: Self-pay | Admitting: Cardiovascular Disease

## 2017-06-17 ENCOUNTER — Encounter (INDEPENDENT_AMBULATORY_CARE_PROVIDER_SITE_OTHER): Payer: PPO | Admitting: Ophthalmology

## 2017-06-17 DIAGNOSIS — I10 Essential (primary) hypertension: Secondary | ICD-10-CM

## 2017-06-17 DIAGNOSIS — H35033 Hypertensive retinopathy, bilateral: Secondary | ICD-10-CM | POA: Diagnosis not present

## 2017-06-17 DIAGNOSIS — H353112 Nonexudative age-related macular degeneration, right eye, intermediate dry stage: Secondary | ICD-10-CM

## 2017-06-17 DIAGNOSIS — H43813 Vitreous degeneration, bilateral: Secondary | ICD-10-CM | POA: Diagnosis not present

## 2017-06-17 DIAGNOSIS — H353221 Exudative age-related macular degeneration, left eye, with active choroidal neovascularization: Secondary | ICD-10-CM

## 2017-06-17 DIAGNOSIS — H2513 Age-related nuclear cataract, bilateral: Secondary | ICD-10-CM | POA: Diagnosis not present

## 2017-07-08 ENCOUNTER — Ambulatory Visit (INDEPENDENT_AMBULATORY_CARE_PROVIDER_SITE_OTHER): Payer: PPO | Admitting: Internal Medicine

## 2017-07-08 ENCOUNTER — Encounter: Payer: Self-pay | Admitting: Internal Medicine

## 2017-07-08 ENCOUNTER — Other Ambulatory Visit (INDEPENDENT_AMBULATORY_CARE_PROVIDER_SITE_OTHER): Payer: PPO

## 2017-07-08 VITALS — BP 132/82 | HR 60 | Temp 98.2°F | Ht 63.0 in | Wt 117.0 lb

## 2017-07-08 DIAGNOSIS — S93401A Sprain of unspecified ligament of right ankle, initial encounter: Secondary | ICD-10-CM

## 2017-07-08 DIAGNOSIS — M8580 Other specified disorders of bone density and structure, unspecified site: Secondary | ICD-10-CM

## 2017-07-08 DIAGNOSIS — E785 Hyperlipidemia, unspecified: Secondary | ICD-10-CM

## 2017-07-08 DIAGNOSIS — I480 Paroxysmal atrial fibrillation: Secondary | ICD-10-CM

## 2017-07-08 DIAGNOSIS — R634 Abnormal weight loss: Secondary | ICD-10-CM

## 2017-07-08 DIAGNOSIS — S93409A Sprain of unspecified ligament of unspecified ankle, initial encounter: Secondary | ICD-10-CM | POA: Insufficient documentation

## 2017-07-08 LAB — LIPID PANEL
CHOL/HDL RATIO: 2
Cholesterol: 164 mg/dL (ref 0–200)
HDL: 68.6 mg/dL (ref >39.00)
LDL Cholesterol: 73 mg/dL (ref 0–99)
NONHDL: 95.78
Triglycerides: 112 mg/dL (ref 0.0–149.0)
VLDL: 22.4 mg/dL (ref 0.0–40.0)

## 2017-07-08 LAB — CBC WITH DIFFERENTIAL/PLATELET
BASOS PCT: 0.6 % (ref 0.0–3.0)
Basophils Absolute: 0 10*3/uL (ref 0.0–0.1)
EOS PCT: 0 % (ref 0.0–5.0)
Eosinophils Absolute: 0 10*3/uL (ref 0.0–0.7)
HEMATOCRIT: 39 % (ref 36.0–46.0)
Hemoglobin: 13 g/dL (ref 12.0–15.0)
LYMPHS ABS: 1.5 10*3/uL (ref 0.7–4.0)
LYMPHS PCT: 24.4 % (ref 12.0–46.0)
MCHC: 33.4 g/dL (ref 30.0–36.0)
MCV: 97.8 fl (ref 78.0–100.0)
MONOS PCT: 15.8 % — AB (ref 3.0–12.0)
Monocytes Absolute: 0.9 10*3/uL (ref 0.1–1.0)
NEUTROS ABS: 3.6 10*3/uL (ref 1.4–7.7)
NEUTROS PCT: 59.2 % (ref 43.0–77.0)
PLATELETS: 288 10*3/uL (ref 150.0–400.0)
RBC: 3.99 Mil/uL (ref 3.87–5.11)
RDW: 13.5 % (ref 11.5–15.5)
WBC: 6 10*3/uL (ref 4.0–10.5)

## 2017-07-08 LAB — BASIC METABOLIC PANEL
BUN: 12 mg/dL (ref 6–23)
CHLORIDE: 103 meq/L (ref 96–112)
CO2: 31 mEq/L (ref 19–32)
Calcium: 9.3 mg/dL (ref 8.4–10.5)
Creatinine, Ser: 0.68 mg/dL (ref 0.40–1.20)
GFR: 90.7 mL/min (ref >60.00)
GLUCOSE: 106 mg/dL — AB (ref 70–99)
POTASSIUM: 5.1 meq/L (ref 3.5–5.1)
SODIUM: 139 meq/L (ref 135–145)

## 2017-07-08 LAB — TSH: TSH: 2.08 u[IU]/mL (ref 0.35–4.50)

## 2017-07-08 NOTE — Patient Instructions (Signed)
Well MC w/Jill 

## 2017-07-08 NOTE — Assessment & Plan Note (Signed)
Cavit D

## 2017-07-08 NOTE — Assessment & Plan Note (Signed)
R ankle recovering

## 2017-07-08 NOTE — Assessment & Plan Note (Signed)
Wt Readings from Last 3 Encounters:  07/08/17 117 lb (53.1 kg)  03/24/17 118 lb 1.3 oz (53.6 kg)  02/09/17 118 lb 12.8 oz (53.9 kg)

## 2017-07-08 NOTE — Progress Notes (Signed)
Subjective:  Patient ID: Vicki [name], female    DOB: 06-01-1946  Age: 71 y.o. MRN: [id number]  CC: No chief complaint on file.   HPI Licet Dunphy presents for A fib, HTN, dyslipidemia f/u. The pt fell in [place] - twisted R foot - X ray w/o fx  Outpatient Medications Prior to Visit  Medication Sig Dispense Refill  . cholecalciferol (VITAMIN D) 1000 UNITS tablet Take 1,000 Units by mouth 2 (two) times daily.    . famotidine (PEPCID) 20 MG tablet TAKE 1 TABLET BY MOUTH ONCE DAILY 90 tablet 0  . hydroxypropyl methylcellulose / hypromellose (ISOPTO TEARS / GONIOVISC) 2.5 % ophthalmic solution Place 1 drop into both eyes as needed for dry eyes.    Marland Kitchen loratadine (CLARITIN) 10 MG tablet Take 10 mg by mouth daily.    . simvastatin (ZOCOR) 20 MG tablet TAKE ONE TABLET BY MOUTH AT BEDTIME 90 tablet 1  . sodium chloride (OCEAN) 0.65 % SOLN nasal spray Place 1 spray into both nostrils at bedtime as needed for congestion.    . triamcinolone ointment (KENALOG) 0.5 % Apply 1 application topically 2 (two) times daily as needed.     . trimethoprim-polymyxin b (POLYTRIM) ophthalmic solution Place 1 drop into the left eye See admin instructions. After eye injection, every 5 weeks, 1 drop 4 times daily in Left eye for 2 days    . XARELTO 20 MG TABS tablet TAKE ONE TABLET BY MOUTH ONCE DAILY 90 tablet 3   No facility-administered medications prior to visit.     ROS Review of Systems  Constitutional: Negative for activity change, appetite change, chills, fatigue and unexpected weight change.  HENT: Negative for congestion, mouth sores and sinus pressure.   Eyes: Negative for visual disturbance.  Respiratory: Negative for cough and chest tightness.   Gastrointestinal: Negative for abdominal pain and nausea.  Genitourinary: Negative for difficulty urinating, frequency and vaginal pain.  Musculoskeletal: Negative for back pain and gait problem.  Skin: Negative for pallor and rash.  Neurological:  Negative for dizziness, tremors, weakness, numbness and headaches.  Psychiatric/Behavioral: Negative for confusion and sleep disturbance.    Objective:  BP 132/82 (BP Location: Right Arm, Patient Position: Sitting, Cuff Size: Normal)   Pulse 60   Temp 98.2 F (36.8 C) (Oral)   Ht 5\' 3"  (1.6 m)   Wt 117 lb (53.1 kg)   SpO2 98%   BMI 20.73 kg/m   BP Readings from Last 3 Encounters:  07/08/17 132/82  03/24/17 140/78  02/09/17 132/68    Wt Readings from Last 3 Encounters:  07/08/17 117 lb (53.1 kg)  03/24/17 118 lb 1.3 oz (53.6 kg)  02/09/17 118 lb 12.8 oz (53.9 kg)    Physical Exam  Constitutional: She appears well-developed. No distress.  HENT:  Head: Normocephalic.  Right Ear: External ear normal.  Left Ear: External ear normal.  Nose: Nose normal.  Mouth/Throat: Oropharynx is clear and moist.  Eyes: Pupils are equal, round, and reactive to light. Conjunctivae are normal. Right eye exhibits no discharge. Left eye exhibits no discharge.  Neck: Normal range of motion. Neck supple. No JVD present. No tracheal deviation present. No thyromegaly present.  Cardiovascular: Normal rate, regular rhythm and normal heart sounds.   Pulmonary/Chest: No stridor. No respiratory distress. She has no wheezes.  Abdominal: Soft. Bowel sounds are normal. She exhibits no distension and no mass. There is no tenderness. There is no rebound and no guarding.  Musculoskeletal: She exhibits no edema  or tenderness.  Lymphadenopathy:    She has no cervical adenopathy.  Neurological: She displays normal reflexes. No cranial nerve deficit. She exhibits normal muscle tone. Coordination normal.  Skin: No rash noted. No erythema.  Psychiatric: She has a normal mood and affect. Her behavior is normal. Judgment and thought content normal.    Lab Results  Component Value Date   WBC 7.6 01/06/2017   HGB 13.4 01/06/2017   HCT 40.2 01/06/2017   PLT 322.0 01/06/2017   GLUCOSE 107 (H) 01/06/2017   CHOL  153 01/06/2017   TRIG 115.0 01/06/2017   HDL 53.00 01/06/2017   LDLCALC 77 01/06/2017   ALT 31 01/06/2017   AST <3 01/06/2017   NA 140 01/06/2017   K 5.2 (H) 01/06/2017   CL 104 01/06/2017   CREATININE 0.65 01/06/2017   BUN 13 01/06/2017   CO2 33 (H) 01/06/2017   TSH 1.49 01/06/2017   INR 1.41 08/07/2015    Ct Chest Wo Contrast  Result Date: 01/27/2017 CLINICAL DATA:  Follow-up lung nodule EXAM: CT CHEST WITHOUT CONTRAST TECHNIQUE: Multidetector CT imaging of the chest was performed following the standard protocol without IV contrast. COMPARISON:  01/28/2016 FINDINGS: Cardiovascular: Atherosclerotic calcifications of thoracic aorta and coronary arteries. No aortic aneurysm. Heart size within normal limits. No pericardial effusion. Mediastinum/Nodes: No mediastinal hematoma or adenopathy. No hilar adenopathy is noted on this unenhanced scan. Central airways are patent. Lungs/Pleura: Images of the lung parenchyma shows no infiltrate or pulmonary edema. No emphysema. No bronchiectasis. No bronchitic changes. No fibrotic changes. Axial image 49 Stable 2 mm nodule in posterior aspect of the right upper lobe. No new pulmonary nodules are noted. Upper Abdomen: The visualized upper abdomen shows atherosclerotic calcifications of abdominal aorta. No adrenal gland mass. Visualized unenhanced liver spleen and tail of the pancreas is unremarkable. Musculoskeletal: No destructive bony lesions are noted. Sagittal images of the spine shows diffuse osteopenia. Degenerative changes upper and mid thoracic spine. IMPRESSION: 1. No infiltrate or pulmonary edema. No mediastinal or hilar adenopathy. 2. Stable 2 mm nodule posterior aspect of the right upper lobe. No new pulmonary nodules are noted. No follow-up needed if patient is low-risk. Non-contrast chest CT can be considered in 12 months if patient is high-risk. This recommendation follows the consensus statement: Guidelines for Management of Incidental Pulmonary  Nodules Detected on CT Images: From the Fleischner Society 2017; Radiology 2017; 284:228-243. 3. Mild degenerative changes thoracic spine. 4. Atherosclerotic calcifications of thoracic aorta and coronary arteries. Electronically Signed   By: Lahoma Crocker M.D.   On: 01/27/2017 11:36    Assessment & Plan:   There are no diagnoses linked to this encounter. I am having Ms. Merida maintain her loratadine, cholecalciferol, triamcinolone ointment, sodium chloride, trimethoprim-polymyxin b, hydroxypropyl methylcellulose / hypromellose, XARELTO, simvastatin, and famotidine.  No orders of the defined types were placed in this encounter.    Follow-up: No Follow-up on file.  Walker Kehr, MD

## 2017-07-08 NOTE — Assessment & Plan Note (Signed)
Xarelto Labs

## 2017-08-03 ENCOUNTER — Encounter (INDEPENDENT_AMBULATORY_CARE_PROVIDER_SITE_OTHER): Payer: PPO | Admitting: Ophthalmology

## 2017-08-03 DIAGNOSIS — H43813 Vitreous degeneration, bilateral: Secondary | ICD-10-CM

## 2017-08-03 DIAGNOSIS — H2513 Age-related nuclear cataract, bilateral: Secondary | ICD-10-CM | POA: Diagnosis not present

## 2017-08-03 DIAGNOSIS — H353112 Nonexudative age-related macular degeneration, right eye, intermediate dry stage: Secondary | ICD-10-CM

## 2017-08-03 DIAGNOSIS — H35033 Hypertensive retinopathy, bilateral: Secondary | ICD-10-CM | POA: Diagnosis not present

## 2017-08-03 DIAGNOSIS — H353221 Exudative age-related macular degeneration, left eye, with active choroidal neovascularization: Secondary | ICD-10-CM

## 2017-08-03 DIAGNOSIS — I10 Essential (primary) hypertension: Secondary | ICD-10-CM

## 2017-08-09 ENCOUNTER — Encounter: Payer: Self-pay | Admitting: Cardiology

## 2017-08-09 ENCOUNTER — Ambulatory Visit (INDEPENDENT_AMBULATORY_CARE_PROVIDER_SITE_OTHER): Payer: PPO | Admitting: Cardiology

## 2017-08-09 VITALS — BP 138/80 | HR 61 | Ht 63.0 in | Wt 119.0 lb

## 2017-08-09 DIAGNOSIS — I481 Persistent atrial fibrillation: Secondary | ICD-10-CM | POA: Diagnosis not present

## 2017-08-09 DIAGNOSIS — E782 Mixed hyperlipidemia: Secondary | ICD-10-CM

## 2017-08-09 DIAGNOSIS — I4819 Other persistent atrial fibrillation: Secondary | ICD-10-CM

## 2017-08-09 NOTE — Progress Notes (Signed)
Electrophysiology Office Note   Date:  08/09/2017   ID:  Vicki [name], DOB 10/11/1946, MRN [id number]  PCP:  Cassandria Anger, MD  Cardiologist:  Quay Burow Primary Electrophysiologist: Constance Haw, MD    Chief Complaint  Patient presents with  . Follow-up    Persistent Afib     History of Present Illness: Vicki [name] is a 71 y.o. female who presents today for electrophysiology evaluation.   She has a history of persistent atrial fibrillation, hyperlipidemia, and aortic insufficiency. She is on Xarelto for [name] of 2.  AF ablation 05/07/16.   Today, denies symptoms of palpitations, chest pain, shortness of breath, orthopnea, PND, lower extremity edema, claudication, dizziness, presyncope, syncope, bleeding, or neurologic sequela. The patient is tolerating medications without difficulties and is otherwise without complaint today. Has had no further episodes of AF.    Past Medical History:  Diagnosis Date  . Adenocarcinoma, breast ([place])   . Allergic rhinitis   . Anxiety   . Aortic insufficiency   . Contact dermatitis   . Diverticulosis of colon   . DJD (degenerative joint disease)   . GERD (gastroesophageal reflux disease)   . Hx of colonic polyps   . Hyperlipidemia   . Osteopenia   . Paroxysmal atrial fibrillation ([facility])    A. fib is now persistent.  . Visit for monitoring Tikosyn therapy 09/06/2015   Past Surgical History:  Procedure Laterality Date  . ABDOMINAL HYSTERECTOMY  1984   with 1 ovary removal  . BREAST LUMPECTOMY Left 08/2007    with node bx  . ELECTROPHYSIOLOGIC STUDY N/A 05/07/2016   Procedure: Atrial Fibrillation Ablation;  Surgeon: Yer Castello Meredith Leeds, MD;  Location: [facility];  Service: Cardiovascular;  Laterality: N/A;     Current Outpatient Prescriptions  Medication Sig Dispense Refill  . cholecalciferol (VITAMIN D) 1000 UNITS tablet Take 1,000 Units by mouth 2 (two) times daily.    . famotidine (PEPCID) 20 MG  tablet TAKE 1 TABLET BY MOUTH ONCE DAILY 90 tablet 0  . hydroxypropyl methylcellulose / hypromellose (ISOPTO TEARS / GONIOVISC) 2.5 % ophthalmic solution Place 1 drop into both eyes as needed for dry eyes.    Marland Kitchen loratadine (CLARITIN) 10 MG tablet Take 10 mg by mouth daily.    . simvastatin (ZOCOR) 20 MG tablet TAKE ONE TABLET BY MOUTH AT BEDTIME 90 tablet 1  . sodium chloride (OCEAN) 0.65 % SOLN nasal spray Place 1 spray into both nostrils at bedtime as needed for congestion.    . triamcinolone ointment (KENALOG) 0.5 % Apply 1 application topically 2 (two) times daily as needed.     . trimethoprim-polymyxin b (POLYTRIM) ophthalmic solution Place 1 drop into the left eye See admin instructions. After eye injection, every 5 weeks, 1 drop 4 times daily in Left eye for 2 days    . XARELTO 20 MG TABS tablet TAKE ONE TABLET BY MOUTH ONCE DAILY 90 tablet 3   No current facility-administered medications for this visit.     Allergies:   Codeine   Social History:  The patient  reports that she quit smoking about 40 years ago. Her smoking use included Cigarettes. She has never used smokeless tobacco. She reports that she does not drink alcohol or use drugs.   Family History:  The patient's family history includes Clotting disorder (age of onset: 38) in her mother; Healthy in her brother; Heart attack in her father; Heart disease in her mother; Heart disease (age of  onset: 43) in her father; Hypertension in her sister; Lymphoma (age of onset: 45) in her sister; Other (age of onset: 41) in her mother.    ROS:  Please see the history of present illness.   Otherwise, review of systems is positive for visual changes.   All other systems are reviewed and negative.   PHYSICAL EXAM: VS:  BP 138/80   Pulse 61   Ht 5\' 3"  (1.6 m)   Wt 119 lb (54 kg)   SpO2 98%   BMI 21.08 kg/m  , BMI Body mass index is 21.08 kg/m. GEN: Well nourished, well developed, in no acute distress  HEENT: normal  Neck: no JVD,  carotid bruits, or masses Cardiac: RRR; no murmurs, rubs, or gallops,no edema  Respiratory:  clear to auscultation bilaterally, normal work of breathing GI: soft, nontender, nondistended, + BS MS: no deformity or atrophy  Skin: warm and dry Neuro:  Strength and sensation are intact Psych: euthymic mood, full affect  EKG:  EKG is not ordered today. Personal review of the ekg ordered 02/2017 shows SR, nonspecific T wave changes   Recent Labs: 01/06/2017: ALT 31 07/08/2017: BUN 12; Creatinine, Ser 0.68; Hemoglobin 13.0; Platelets 288.0; Potassium 5.1; Sodium 139; TSH 2.08    Lipid Panel     Component Value Date/Time   CHOL 164 07/08/2017 0828   TRIG 112.0 07/08/2017 0828   HDL 68.60 07/08/2017 0828   CHOLHDL 2 07/08/2017 0828   VLDL 22.4 07/08/2017 0828   LDLCALC 73 07/08/2017 0828     Wt Readings from Last 3 Encounters:  08/09/17 119 lb (54 kg)  07/08/17 117 lb (53.1 kg)  03/24/17 118 lb 1.3 oz (53.6 kg)      Other studies Reviewed: Additional studies/ records that were reviewed today include: 07/09/15 TTE Review of the above records today demonstrates: - Normal LV function; elevated LV filling pressure; mild AI; thickened MV with prolapse of the anterior leaflet; eccentric, posteriorly directed MR difficult to quantitate but appears to be mild (may be underestimated due to eccentric nature); mild TR, mildly elevated pulmonary pressures.    ASSESSMENT AND PLAN:  1.   PERSISTENT ATRIAL FIBRILLATION:  On Xarelto. In sinus rhythm today. No changes at this time.  This patients CHA2DS2-VASc Score and unadjusted Ischemic Stroke Rate (% per year) is equal to 2.2 % stroke rate/year from a score of 2  Above score calculated as 1 point each if present [CHF, HTN, DM, Vascular=MI/PAD/Aortic Plaque, Age if 65-74, or Female] Above score calculated as 2 points each if present [Age > 75, or Stroke/TIA/TE]   2. Hyperlipidemia: continue Zocor.  Current medicines are reviewed  at length with the patient today.   The patient does not have concerns regarding her medicines.  The following changes were made today:  Stop dofetilide  Labs/ tests ordered today include:  No orders of the defined types were placed in this encounter.    Disposition:   FU with Sterling Mondo 6  months  Signed, Miaa Latterell Meredith Leeds, MD  08/09/2017 10:59 AM     [facility] [address] [place] [address] [phone] (office) [phone] (fax)

## 2017-08-09 NOTE — Patient Instructions (Addendum)

## 2017-08-12 ENCOUNTER — Telehealth: Payer: Self-pay | Admitting: Internal Medicine

## 2017-08-12 NOTE — Telephone Encounter (Signed)
Spoke with pt regarding wellness visit scheduled for 08/15/17. Per pt okay to r/s appt for 01/16/17. SF

## 2017-08-12 NOTE — Telephone Encounter (Signed)
AWV scheduled too early. Pt completed AWV in Feb 2018. Will need to r/s appt after 01/15/2018 for ins to cover visit. Called pt lvm for pt to call office to r/s appt. SF.

## 2017-08-15 ENCOUNTER — Ambulatory Visit: Payer: PPO

## 2017-08-25 ENCOUNTER — Other Ambulatory Visit: Payer: Self-pay | Admitting: Cardiovascular Disease

## 2017-09-03 ENCOUNTER — Other Ambulatory Visit: Payer: Self-pay | Admitting: Internal Medicine

## 2017-09-16 ENCOUNTER — Other Ambulatory Visit: Payer: Self-pay | Admitting: Internal Medicine

## 2017-09-21 ENCOUNTER — Encounter (INDEPENDENT_AMBULATORY_CARE_PROVIDER_SITE_OTHER): Payer: PPO | Admitting: Ophthalmology

## 2017-09-21 DIAGNOSIS — H2513 Age-related nuclear cataract, bilateral: Secondary | ICD-10-CM | POA: Diagnosis not present

## 2017-09-21 DIAGNOSIS — H35033 Hypertensive retinopathy, bilateral: Secondary | ICD-10-CM

## 2017-09-21 DIAGNOSIS — I10 Essential (primary) hypertension: Secondary | ICD-10-CM

## 2017-09-21 DIAGNOSIS — H43813 Vitreous degeneration, bilateral: Secondary | ICD-10-CM | POA: Diagnosis not present

## 2017-09-21 DIAGNOSIS — H353221 Exudative age-related macular degeneration, left eye, with active choroidal neovascularization: Secondary | ICD-10-CM | POA: Diagnosis not present

## 2017-09-21 DIAGNOSIS — H353112 Nonexudative age-related macular degeneration, right eye, intermediate dry stage: Secondary | ICD-10-CM

## 2017-11-02 ENCOUNTER — Other Ambulatory Visit: Payer: Self-pay | Admitting: Internal Medicine

## 2017-11-02 ENCOUNTER — Encounter (INDEPENDENT_AMBULATORY_CARE_PROVIDER_SITE_OTHER): Payer: PPO | Admitting: Ophthalmology

## 2017-11-02 DIAGNOSIS — H43813 Vitreous degeneration, bilateral: Secondary | ICD-10-CM

## 2017-11-02 DIAGNOSIS — H2513 Age-related nuclear cataract, bilateral: Secondary | ICD-10-CM | POA: Diagnosis not present

## 2017-11-02 DIAGNOSIS — H353221 Exudative age-related macular degeneration, left eye, with active choroidal neovascularization: Secondary | ICD-10-CM

## 2017-11-02 DIAGNOSIS — H353112 Nonexudative age-related macular degeneration, right eye, intermediate dry stage: Secondary | ICD-10-CM | POA: Diagnosis not present

## 2017-11-02 DIAGNOSIS — I10 Essential (primary) hypertension: Secondary | ICD-10-CM | POA: Diagnosis not present

## 2017-11-02 DIAGNOSIS — Z139 Encounter for screening, unspecified: Secondary | ICD-10-CM

## 2017-11-02 DIAGNOSIS — H35033 Hypertensive retinopathy, bilateral: Secondary | ICD-10-CM | POA: Diagnosis not present

## 2017-11-22 ENCOUNTER — Other Ambulatory Visit: Payer: Self-pay | Admitting: Cardiovascular Disease

## 2017-12-06 ENCOUNTER — Other Ambulatory Visit (HOSPITAL_COMMUNITY): Payer: Self-pay | Admitting: Nurse Practitioner

## 2017-12-07 NOTE — Telephone Encounter (Signed)
Pt last saw Dr Curt Bears on 08/09/17, last labs 07/08/17 Creat 0.68, age 72, weight 54kg, CrCl 64.69, based on CrCl pt is on appropriate dosage of Xarelto 20mg  QD.  Will refill rx.

## 2017-12-09 ENCOUNTER — Ambulatory Visit
Admission: RE | Admit: 2017-12-09 | Discharge: 2017-12-09 | Disposition: A | Discharge status: Home / Self Care | Payer: PPO | Source: Ambulatory Visit | Attending: Internal Medicine | Admitting: Internal Medicine

## 2017-12-09 DIAGNOSIS — Z139 Encounter for screening, unspecified: Secondary | ICD-10-CM

## 2017-12-09 DIAGNOSIS — Z1231 Encounter for screening mammogram for malignant neoplasm of breast: Secondary | ICD-10-CM | POA: Diagnosis not present

## 2017-12-09 HISTORY — DX: Malignant neoplasm of unspecified site of unspecified female breast: C50.919

## 2017-12-09 HISTORY — DX: Personal history of irradiation: Z92.3

## 2017-12-21 ENCOUNTER — Encounter (INDEPENDENT_AMBULATORY_CARE_PROVIDER_SITE_OTHER): Payer: PPO | Admitting: Ophthalmology

## 2017-12-21 DIAGNOSIS — I10 Essential (primary) hypertension: Secondary | ICD-10-CM | POA: Diagnosis not present

## 2017-12-21 DIAGNOSIS — H35033 Hypertensive retinopathy, bilateral: Secondary | ICD-10-CM

## 2017-12-21 DIAGNOSIS — H353112 Nonexudative age-related macular degeneration, right eye, intermediate dry stage: Secondary | ICD-10-CM | POA: Diagnosis not present

## 2017-12-21 DIAGNOSIS — H2513 Age-related nuclear cataract, bilateral: Secondary | ICD-10-CM | POA: Diagnosis not present

## 2017-12-21 DIAGNOSIS — H353221 Exudative age-related macular degeneration, left eye, with active choroidal neovascularization: Secondary | ICD-10-CM

## 2017-12-21 DIAGNOSIS — H43813 Vitreous degeneration, bilateral: Secondary | ICD-10-CM | POA: Diagnosis not present

## 2018-01-09 ENCOUNTER — Encounter: Payer: Self-pay | Admitting: Internal Medicine

## 2018-01-09 ENCOUNTER — Ambulatory Visit (INDEPENDENT_AMBULATORY_CARE_PROVIDER_SITE_OTHER): Payer: PPO | Admitting: Internal Medicine

## 2018-01-09 ENCOUNTER — Other Ambulatory Visit (INDEPENDENT_AMBULATORY_CARE_PROVIDER_SITE_OTHER): Payer: PPO

## 2018-01-09 DIAGNOSIS — R634 Abnormal weight loss: Secondary | ICD-10-CM

## 2018-01-09 DIAGNOSIS — I480 Paroxysmal atrial fibrillation: Secondary | ICD-10-CM | POA: Diagnosis not present

## 2018-01-09 DIAGNOSIS — E785 Hyperlipidemia, unspecified: Secondary | ICD-10-CM | POA: Diagnosis not present

## 2018-01-09 DIAGNOSIS — G4700 Insomnia, unspecified: Secondary | ICD-10-CM | POA: Diagnosis not present

## 2018-01-09 LAB — BASIC METABOLIC PANEL
BUN: 12 mg/dL (ref 6–23)
CHLORIDE: 99 meq/L (ref 96–112)
CO2: 29 meq/L (ref 19–32)
Calcium: 9.2 mg/dL (ref 8.4–10.5)
Creatinine, Ser: 0.6 mg/dL (ref 0.40–1.20)
GFR: 104.64 mL/min (ref >60.00)
Glucose, Bld: 98 mg/dL (ref 70–99)
POTASSIUM: 4.1 meq/L (ref 3.5–5.1)
SODIUM: 137 meq/L (ref 135–145)

## 2018-01-09 NOTE — Assessment & Plan Note (Signed)
Try Valerian root for insomnia 

## 2018-01-09 NOTE — Assessment & Plan Note (Signed)
Resolved Wt Readings from Last 3 Encounters:  01/09/18 120 lb (54.4 kg)  08/09/17 119 lb (54 kg)  07/08/17 117 lb (53.1 kg)

## 2018-01-09 NOTE — Progress Notes (Signed)
Subjective:  Patient ID: Vicki [name], female    DOB: 12/14/1945  Age: 72 y.o. MRN: [id number]  CC: No chief complaint on file.   HPI [name] presents for A fib, anticoagulation f/u C/o insomnia - melatonin did not work   Outpatient Medications Prior to Visit  Medication Sig Dispense Refill  . cholecalciferol (VITAMIN D) 1000 UNITS tablet Take 1,000 Units by mouth 2 (two) times daily.    . famotidine (PEPCID) 20 MG tablet TAKE 1 TABLET BY MOUTH ONCE DAILY 90 tablet 0  . hydroxypropyl methylcellulose / hypromellose (ISOPTO TEARS / GONIOVISC) 2.5 % ophthalmic solution Place 1 drop into both eyes as needed for dry eyes.    Marland Kitchen loratadine (CLARITIN) 10 MG tablet Take 10 mg by mouth daily.    . simvastatin (ZOCOR) 20 MG tablet Take 1 tablet (20 mg total) by mouth at bedtime. 90 tablet 3  . sodium chloride (OCEAN) 0.65 % SOLN nasal spray Place 1 spray into both nostrils at bedtime as needed for congestion.    . triamcinolone ointment (KENALOG) 0.5 % APPLY OINTMENT TOPICALLY TWICE DAILY TO HAND RASH 45 g 3  . trimethoprim-polymyxin b (POLYTRIM) ophthalmic solution Place 1 drop into the left eye See admin instructions. After eye injection, every 5 weeks, 1 drop 4 times daily in Left eye for 2 days    . XARELTO 20 MG TABS tablet TAKE ONE TABLET BY MOUTH ONCE DAILY 30 tablet 7   No facility-administered medications prior to visit.     ROS Review of Systems  Constitutional: Negative for activity change, appetite change, chills, fatigue and unexpected weight change.  HENT: Negative for congestion, mouth sores and sinus pressure.   Eyes: Negative for visual disturbance.  Respiratory: Negative for cough and chest tightness.   Gastrointestinal: Negative for abdominal pain and nausea.  Genitourinary: Negative for difficulty urinating, frequency and vaginal pain.  Musculoskeletal: Negative for back pain and gait problem.  Skin: Negative for pallor and rash.  Neurological: Negative  for dizziness, tremors, weakness, numbness and headaches.  Psychiatric/Behavioral: Positive for sleep disturbance. Negative for confusion.    Objective:  BP 122/76 (BP Location: Left Arm, Patient Position: Sitting, Cuff Size: Normal)   Pulse 60   Temp 97.8 F (36.6 C) (Oral)   Ht 5\' 3"  (1.6 m)   Wt 120 lb (54.4 kg)   SpO2 99%   BMI 21.26 kg/m   BP Readings from Last 3 Encounters:  01/09/18 122/76  08/09/17 138/80  07/08/17 132/82    Wt Readings from Last 3 Encounters:  01/09/18 120 lb (54.4 kg)  08/09/17 119 lb (54 kg)  07/08/17 117 lb (53.1 kg)    Physical Exam  Constitutional: She appears well-developed. No distress.  HENT:  Head: Normocephalic.  Right Ear: External ear normal.  Left Ear: External ear normal.  Nose: Nose normal.  Mouth/Throat: Oropharynx is clear and moist.  Eyes: Conjunctivae are normal. Pupils are equal, round, and reactive to light. Right eye exhibits no discharge. Left eye exhibits no discharge.  Neck: Normal range of motion. Neck supple. No JVD present. No tracheal deviation present. No thyromegaly present.  Cardiovascular: Normal rate, regular rhythm and normal heart sounds.  Pulmonary/Chest: No stridor. No respiratory distress. She has no wheezes.  Abdominal: Soft. Bowel sounds are normal. She exhibits no distension and no mass. There is no tenderness. There is no rebound and no guarding.  Musculoskeletal: She exhibits no edema or tenderness.  Lymphadenopathy:    She has no  cervical adenopathy.  Neurological: She displays normal reflexes. No cranial nerve deficit. She exhibits normal muscle tone. Coordination normal.  Skin: No rash noted. No erythema.  Psychiatric: She has a normal mood and affect. Her behavior is normal. Judgment and thought content normal.    Lab Results  Component Value Date   WBC 6.0 07/08/2017   HGB 13.0 07/08/2017   HCT 39.0 07/08/2017   PLT 288.0 07/08/2017   GLUCOSE 106 (H) 07/08/2017   CHOL 164 07/08/2017    TRIG 112.0 07/08/2017   HDL 68.60 07/08/2017   LDLCALC 73 07/08/2017   ALT 31 01/06/2017   AST <3 01/06/2017   NA 139 07/08/2017   K 5.1 07/08/2017   CL 103 07/08/2017   CREATININE 0.68 07/08/2017   BUN 12 07/08/2017   CO2 31 07/08/2017   TSH 2.08 07/08/2017   INR 1.41 08/07/2015    Mm Screening Breast Tomo Bilateral  Result Date: 12/09/2017 CLINICAL DATA:  Screening. EXAM: 2D DIGITAL SCREENING BILATERAL MAMMOGRAM WITH 3D TOMO WITH CAD COMPARISON:  Previous exam(s). ACR Breast Density Category b: There are scattered areas of fibroglandular density. FINDINGS: There are no findings suspicious for malignancy. Images were processed with CAD. IMPRESSION: No mammographic evidence of malignancy. A result letter of this screening mammogram will be mailed directly to the patient. RECOMMENDATION: Screening mammogram in one year. (Code:SM-B-01Y) BI-RADS CATEGORY  1: Negative. Electronically Signed   By: Dorise Bullion III M.D   On: 12/09/2017 07:46    Assessment & Plan:    Follow-up: No Follow-up on file.  Walker Kehr, MD

## 2018-01-09 NOTE — Assessment & Plan Note (Signed)
Simvastatin 

## 2018-01-09 NOTE — Assessment & Plan Note (Signed)
On Xarelto and off Tycosyn. S/p ablation 6/17 

## 2018-01-09 NOTE — Patient Instructions (Addendum)
Try Valerian root for insomnia  [facility] well w/Jill

## 2018-01-13 NOTE — Progress Notes (Addendum)
Subjective:   Vicki [name] is a 72 y.o. female who presents for Medicare Annual (Subsequent) preventive examination.  Review of Systems:  No ROS.  Medicare Wellness Visit. Additional risk factors are reflected in the social history.  Cardiac Risk Factors include: advanced age (>72men, >6 women);dyslipidemia Sleep patterns: gets up 1 times nightly to void and sleeps 5 hours nightly.    Home Safety/Smoke Alarms: Feels safe in home. Smoke alarms in place.  Living environment; residence and Firearm Safety: 1-story house/ trailer, no firearms.  Seat Belt Safety/Bike Helmet: Wears seat belt.     Objective:     Vitals: BP 128/64   Pulse 61   Resp 18   Ht 5\' 3"  (1.6 m)   Wt 120 lb (54.4 kg)   SpO2 97%   BMI 21.26 kg/m   Body mass index is 21.26 kg/m.  Advanced Directives 01/16/2018 05/07/2016 09/03/2015 08/14/2015  Does Patient Have a Medical Advance Directive? No No Yes Yes  Type of Advance Directive - - Living will Living will  Does patient want to make changes to medical advance directive? Yes (ED - Information included in AVS) - No - Patient declined -  Copy of [place] in Chart? - - No - copy requested No - copy requested  Would patient like information on creating a medical advance directive? - No - patient declined information - -    Tobacco Social History   Tobacco Use  Smoking Status Former Smoker  . Types: Cigarettes  . Last attempt to quit: 12/06/1976  . Years since quitting: 41.1  Smokeless Tobacco Never Used  Tobacco Comment   smoked approx 10 years     Counseling given: Not Answered Comment: smoked approx 10 years  Past Medical History:  Diagnosis Date  . Adenocarcinoma, breast ([place])   . Allergic rhinitis   . Anxiety   . Aortic insufficiency   . Breast cancer [facility]) 2008   Left Breast Cancer  . Contact dermatitis   . Diverticulosis of colon   . DJD (degenerative joint disease)   . GERD (gastroesophageal reflux disease)   . Hx  of colonic polyps   . Hyperlipidemia   . Osteopenia   . Paroxysmal atrial fibrillation (HCC)    A. fib is now persistent.  . Personal history of radiation therapy 2008   Left Breast Cancer  . Visit for monitoring Tikosyn therapy 09/06/2015   Past Surgical History:  Procedure Laterality Date  . ABDOMINAL HYSTERECTOMY  1984   with 1 ovary removal  . BREAST LUMPECTOMY Left 08/2007    with node bx  . ELECTROPHYSIOLOGIC STUDY N/A 05/07/2016   Procedure: Atrial Fibrillation Ablation;  Surgeon: Will Meredith Leeds, MD;  Location: [place] [facility];  Service: Cardiovascular;  Laterality: N/A;   Family History  Problem Relation Age of Onset  . Heart disease Father 24  . Heart attack Father   . Other Mother 64       AVR  . Heart disease Mother   . Clotting disorder Mother 54  . Breast cancer Sister   . Lymphoma Sister 75  . Hypertension Sister   . Healthy Brother   . Other Maternal Grandmother    Social History   Socioeconomic History  . Marital status: Married    Spouse name: None  . Number of children: 1  . Years of education: None  . Highest education level: None  Social Needs  . Financial resource strain: Not hard at all  .  Food insecurity - worry: Never true  . Food insecurity - inability: Never true  . Transportation needs - medical: No  . Transportation needs - non-medical: No  Occupational History  . Occupation: ACCOUNTING    Employer: [employer]  Tobacco Use  . Smoking status: Former Smoker    Types: Cigarettes    Last attempt to quit: 12/06/1976    Years since quitting: 41.1  . Smokeless tobacco: Never Used  . Tobacco comment: smoked approx 10 years  Substance and Sexual Activity  . Alcohol use: No    Alcohol/week: 0.0 oz  . Drug use: No  . Sexual activity: None  Other Topics Concern  . None  Social History Narrative  . None    Outpatient Encounter Medications as of 01/16/2018  Medication Sig  . cholecalciferol (VITAMIN D) 1000 UNITS tablet Take  1,000 Units by mouth 2 (two) times daily.  . famotidine (PEPCID) 20 MG tablet TAKE 1 TABLET BY MOUTH ONCE DAILY  . hydroxypropyl methylcellulose / hypromellose (ISOPTO TEARS / GONIOVISC) 2.5 % ophthalmic solution Place 1 drop into both eyes as needed for dry eyes.  Marland Kitchen loratadine (CLARITIN) 10 MG tablet Take 10 mg by mouth daily.  . Melatonin 3 MG TABS Take 3 mg by mouth.  . simvastatin (ZOCOR) 20 MG tablet Take 1 tablet (20 mg total) by mouth at bedtime.  . sodium chloride (OCEAN) 0.65 % SOLN nasal spray Place 1 spray into both nostrils at bedtime as needed for congestion.  . triamcinolone ointment (KENALOG) 0.5 % APPLY OINTMENT TOPICALLY TWICE DAILY TO HAND RASH  . trimethoprim-polymyxin b (POLYTRIM) ophthalmic solution Place 1 drop into the left eye See admin instructions. After eye injection, every 5 weeks, 1 drop 4 times daily in Left eye for 2 days  . XARELTO 20 MG TABS tablet TAKE ONE TABLET BY MOUTH ONCE DAILY   No facility-administered encounter medications on file as of 01/16/2018.     Activities of Daily Living In your present state of health, do you have any difficulty performing the following activities: 01/16/2018  Hearing? N  Vision? N  Difficulty concentrating or making decisions? N  Walking or climbing stairs? N  Dressing or bathing? N  Doing errands, shopping? N  Preparing Food and eating ? N  Using the Toilet? N  In the past six months, have you accidently leaked urine? N  Do you have problems with loss of bowel control? N  Managing your Medications? N  Managing your Finances? N  Housekeeping or managing your Housekeeping? N  Some recent data might be hidden    Patient Care Team: Plotnikov, Evie Lacks, MD as PCP - General (Internal Medicine) Lorretta Harp, MD as Consulting Physician (Cardiology)    Assessment:   This is a routine wellness examination for Vicki [name]. Physical assessment deferred to PCP.   Exercise Activities and Dietary recommendations Current  Exercise Habits: Structured exercise class, Type of exercise: walking, Time (Minutes): 35, Frequency (Times/Week): 5, Weekly Exercise (Minutes/Week): 175, Intensity: Mild, Exercise limited by: None identified Diet (meal preparation, eat out, water intake, caffeinated beverages, dairy products, fruits and vegetables): in general, a "healthy" diet  , well balanced, eats a variety of fruits and vegetables daily, limits salt, fat/cholesterol, sugar, caffeine, drinks 1 glasses of water daily.  Encouraged patient to increase daily water intake.   Goals    . Exercise 150 minutes per week (moderate activity)     [facility]; walk 1 mile in the neighborhood 2 to 3  days    . Patient Stated     Increase the amount of water I drink. I will start by drinking 2 glasses of water daily. Continue to walk, do yard work, enjoy life, family, and travel.       Fall Risk Fall Risk  01/16/2018 07/08/2017 07/07/2016 07/04/2015  Falls in the past year? Yes Yes No No  Number falls in past yr: - 1 - -  Injury with Fall? Yes Yes - -    Depression Screen PHQ 2/9 Scores 01/16/2018 07/08/2017 07/07/2016 07/04/2015  PHQ - 2 Score 0 0 0 0  PHQ- 9 Score 0 - - -     Cognitive Function MMSE - Mini Mental State Exam 01/16/2018  Orientation to time 5  Orientation to Place 5  Registration 3  Attention/ Calculation 5  Recall 3  Language- name 2 objects 2  Language- repeat 1  Language- follow 3 step command 3  Language- read & follow direction 1  Write a sentence 1  Copy design 1  Total score 30        Immunization History  Administered Date(s) Administered  . Influenza Split 09/17/2011, 09/21/2012  . Influenza Whole 09/07/2010, 09/19/2016  . Influenza, High Dose Seasonal PF 08/30/2017  . Influenza-Unspecified 09/05/2014, 09/15/2015  . Pneumococcal Conjugate-13 07/04/2015  . Pneumococcal Polysaccharide-23 03/19/2014  . Td 07/04/2015  . Zoster 09/28/2016   Screening Tests Health Maintenance  Topic Date Due    . COLONOSCOPY  01/25/2018  . MAMMOGRAM  12/10/2019  . TETANUS/TDAP  07/03/2025  . INFLUENZA VACCINE  Completed  . DEXA SCAN  Completed  . Hepatitis C Screening  Completed  . PNA vac Low Risk Adult  Completed      Plan:    Continue doing brain stimulating activities (puzzles, reading, adult coloring books, staying active) to keep memory sharp.   Continue to eat heart healthy diet (full of fruits, vegetables, whole grains, lean protein, water--limit salt, fat, and sugar intake) and increase physical activity as tolerated.  I have personally reviewed and noted the following in the patient's chart:   . Medical and social history . Use of alcohol, tobacco or illicit drugs  . Current medications and supplements . Functional ability and status . Nutritional status . Physical activity . Advanced directives . List of other physicians . Vitals . Screenings to include cognitive, depression, and falls . Referrals and appointments  In addition, I have reviewed and discussed with patient certain preventive protocols, quality metrics, and best practice recommendations. A written personalized care plan for preventive services as well as general preventive health recommendations were provided to patient.     Michiel Cowboy, RN  01/16/2018  Medical screening examination/treatment/procedure(s) were performed by non-physician practitioner and as supervising physician I was immediately available for consultation/collaboration. I agree with above. Lew Dawes, MD

## 2018-01-16 ENCOUNTER — Ambulatory Visit (INDEPENDENT_AMBULATORY_CARE_PROVIDER_SITE_OTHER): Payer: PPO | Admitting: *Deleted

## 2018-01-16 VITALS — BP 128/64 | HR 61 | Resp 18 | Ht 63.0 in | Wt 120.0 lb

## 2018-01-16 DIAGNOSIS — Z0000 Encounter for general adult medical examination without abnormal findings: Secondary | ICD-10-CM

## 2018-01-16 NOTE — Patient Instructions (Addendum)
Ms. Rasnick , Thank you for taking time to come for your Medicare Wellness Visit. I appreciate your ongoing commitment to your health goals. Please review the following plan we discussed and let me know if I can assist you in the future.   These are the goals we discussed: Goals    . Exercise 150 minutes per week (moderate activity)     Outside the Y; walk 1 mile in the neighborhood 2 to 3 days    . Patient Stated     Increase the amount of water I drink. I will start by drinking 2 glasses of water daily. Continue to walk, do yard work, enjoy life, family, and travel.       This is a list of the screening recommended for you and due dates:  Health Maintenance  Topic Date Due  . Colon Cancer Screening  01/25/2018  . Mammogram  12/10/2019  . Tetanus Vaccine  07/03/2025  . Flu Shot  Completed  . DEXA scan (bone density measurement)  Completed  .  Hepatitis C: One time screening is recommended by Center for Disease Control  (CDC) for  adults born from [birth year] through 1965.   Completed  . Pneumonia vaccines  Completed

## 2018-01-18 ENCOUNTER — Encounter: Payer: Self-pay | Admitting: Gastroenterology

## 2018-01-23 ENCOUNTER — Ambulatory Visit: Payer: PPO | Admitting: Cardiology

## 2018-01-30 NOTE — Progress Notes (Signed)
Electrophysiology Office Note   Date:  01/31/2018   ID:  Shauntea, Lok 08/15/1946, MRN [id number]  PCP:  Cassandria Anger, MD  Cardiologist:  Quay Burow Primary Electrophysiologist: Constance Haw, MD    Chief Complaint  Patient presents with  . Follow-up    Persistent Afib     History of Present Illness: Vicki [name] is a 72 y.o. female who presents today for electrophysiology evaluation.   She has a history of persistent atrial fibrillation, hyperlipidemia, and aortic insufficiency. She is on Xarelto for [facility] of 2.  AF ablation 05/07/16.   Today, denies symptoms of palpitations, chest pain, shortness of breath, orthopnea, PND, lower extremity edema, claudication, dizziness, presyncope, syncope, bleeding, or neurologic sequela. The patient is tolerating medications without difficulties.  Currently feeling well without complaint.  She has had no further episodes of atrial fibrillation.   Past Medical History:  Diagnosis Date  . Adenocarcinoma, breast ([place])   . Allergic rhinitis   . Anxiety   . Aortic insufficiency   . Breast cancer [facility]) 2008   Left Breast Cancer  . Contact dermatitis   . Diverticulosis of colon   . DJD (degenerative joint disease)   . GERD (gastroesophageal reflux disease)   . Hx of colonic polyps   . Hyperlipidemia   . Osteopenia   . Paroxysmal atrial fibrillation (HCC)    A. fib is now persistent.  . Personal history of radiation therapy 2008   Left Breast Cancer  . Visit for monitoring Tikosyn therapy 09/06/2015   Past Surgical History:  Procedure Laterality Date  . ABDOMINAL HYSTERECTOMY  1984   with 1 ovary removal  . BREAST LUMPECTOMY Left 08/2007    with node bx  . ELECTROPHYSIOLOGIC STUDY N/A 05/07/2016   Procedure: Atrial Fibrillation Ablation;  Surgeon: Dornell Grasmick Meredith Leeds, MD;  Location: [place] [facility];  Service: Cardiovascular;  Laterality: N/A;     Current Outpatient Medications  Medication Sig  Dispense Refill  . cholecalciferol (VITAMIN D) 1000 UNITS tablet Take 1,000 Units by mouth 2 (two) times daily.    . famotidine (PEPCID) 20 MG tablet TAKE 1 TABLET BY MOUTH ONCE DAILY 90 tablet 0  . hydroxypropyl methylcellulose / hypromellose (ISOPTO TEARS / GONIOVISC) 2.5 % ophthalmic solution Place 1 drop into both eyes as needed for dry eyes.    Marland Kitchen loratadine (CLARITIN) 10 MG tablet Take 10 mg by mouth daily.    . Melatonin 3 MG TABS Take 3 mg by mouth.    . simvastatin (ZOCOR) 20 MG tablet Take 1 tablet (20 mg total) by mouth at bedtime. 90 tablet 3  . sodium chloride (OCEAN) 0.65 % SOLN nasal spray Place 1 spray into both nostrils at bedtime as needed for congestion.    . triamcinolone ointment (KENALOG) 0.5 % APPLY OINTMENT TOPICALLY TWICE DAILY TO HAND RASH 45 g 3  . trimethoprim-polymyxin b (POLYTRIM) ophthalmic solution Place 1 drop into the left eye See admin instructions. After eye injection, every 5 weeks, 1 drop 4 times daily in Left eye for 2 days    . XARELTO 20 MG TABS tablet TAKE ONE TABLET BY MOUTH ONCE DAILY 30 tablet 7   No current facility-administered medications for this visit.     Allergies:   Codeine   Social History:  The patient  reports that she quit smoking about 41 years ago. Her smoking use included cigarettes. she has never used smokeless tobacco. She reports that she does not drink alcohol  or use drugs.   Family History:  The patient's family history includes Breast cancer in her sister; Clotting disorder (age of onset: 73) in her mother; Healthy in her brother; Heart attack in her father; Heart disease in her mother; Heart disease (age of onset: 3) in her father; Hypertension in her sister; Lymphoma (age of onset: 85) in her sister; Other in her maternal grandmother; Other (age of onset: 50) in her mother.   ROS:  Please see the history of present illness.   Otherwise, review of systems is positive for none.   All other systems are reviewed and negative.    PHYSICAL EXAM: VS:  There were no vitals taken for this visit. , BMI There is no height or weight on file to calculate BMI. GEN: Well nourished, well developed, in no acute distress  HEENT: normal  Neck: no JVD, carotid bruits, or masses Cardiac: RRR; no murmurs, rubs, or gallops,no edema  Respiratory:  clear to auscultation bilaterally, normal work of breathing GI: soft, nontender, nondistended, + BS MS: no deformity or atrophy  Skin: warm and dry Neuro:  Strength and sensation are intact Psych: euthymic mood, full affect  EKG:  EKG is ordered today. Personal review of the ekg ordered shows sinus rhythm, PRWP, , LAD, rate 63   Recent Labs: 07/08/2017: Hemoglobin 13.0; Platelets 288.0; TSH 2.08 01/09/2018: BUN 12; Creatinine, Ser 0.60; Potassium 4.1; Sodium 137    Lipid Panel     Component Value Date/Time   CHOL 164 07/08/2017 0828   TRIG 112.0 07/08/2017 0828   HDL 68.60 07/08/2017 0828   CHOLHDL 2 07/08/2017 0828   VLDL 22.4 07/08/2017 0828   LDLCALC 73 07/08/2017 0828     Wt Readings from Last 3 Encounters:  01/16/18 120 lb (54.4 kg)  01/09/18 120 lb (54.4 kg)  08/09/17 119 lb (54 kg)      Other studies Reviewed: Additional studies/ records that were reviewed today include: 07/09/15 TTE Review of the above records today demonstrates: - Normal LV function; elevated LV filling pressure; mild AI; thickened MV with prolapse of the anterior leaflet; eccentric, posteriorly directed MR difficult to quantitate but appears to be mild (may be underestimated due to eccentric nature); mild TR, mildly elevated pulmonary pressures.    ASSESSMENT AND PLAN:  1.   PERSISTENT ATRIAL FIBRILLATION: Early on Xarelto.  She has had no further episodes of atrial fibrillation after her ablation.  No changes at this time.  This patients CHA2DS2-VASc Score and unadjusted Ischemic Stroke Rate (% per year) is equal to 2.2 % stroke rate/year from a score of 2  Above score  calculated as 1 point each if present [CHF, HTN, DM, Vascular=MI/PAD/Aortic Plaque, Age if 65-74, or Female] Above score calculated as 2 points each if present [Age > 75, or Stroke/TIA/TE]   2. Hyperlipidemia: Continue Zocor  Current medicines are reviewed at length with the patient today.   The patient does not have concerns regarding her medicines.  The following changes were made today:  none  Labs/ tests ordered today include:  No orders of the defined types were placed in this encounter.    Disposition:   FU with Treyvon Blahut 12 months  Signed, Evangeline Utley Meredith Leeds, MD  01/31/2018 9:03 AM     [facility] [address] [place] [address] [phone] (office) [phone] (fax)

## 2018-01-31 ENCOUNTER — Ambulatory Visit: Payer: PPO | Admitting: Cardiology

## 2018-01-31 ENCOUNTER — Encounter: Payer: Self-pay | Admitting: Cardiology

## 2018-01-31 VITALS — BP 144/80 | HR 63 | Ht 63.0 in | Wt 120.0 lb

## 2018-01-31 DIAGNOSIS — I481 Persistent atrial fibrillation: Secondary | ICD-10-CM | POA: Diagnosis not present

## 2018-01-31 DIAGNOSIS — E785 Hyperlipidemia, unspecified: Secondary | ICD-10-CM

## 2018-01-31 DIAGNOSIS — I4819 Other persistent atrial fibrillation: Secondary | ICD-10-CM

## 2018-01-31 NOTE — Patient Instructions (Addendum)
Medication Instructions:  Your physician recommends that you continue on your current medications as directed. Please refer to the Current Medication list given to you today.  * If you need a refill on your cardiac medications before your next appointment, please call your pharmacy.   Labwork: None ordered  Testing/Procedures: None ordered  Follow-Up: Your physician wants you to follow-up in: 1 year with Dr. Camnitz.  You will receive a reminder letter in the mail two months in advance. If you don't receive a letter, please call our office to schedule the follow-up appointment.  Thank you for choosing [facility]!!   Yaneli Keithley, RN [phone]        

## 2018-02-08 ENCOUNTER — Encounter (INDEPENDENT_AMBULATORY_CARE_PROVIDER_SITE_OTHER): Payer: PPO | Admitting: Ophthalmology

## 2018-02-08 DIAGNOSIS — H353221 Exudative age-related macular degeneration, left eye, with active choroidal neovascularization: Secondary | ICD-10-CM

## 2018-02-08 DIAGNOSIS — H35033 Hypertensive retinopathy, bilateral: Secondary | ICD-10-CM | POA: Diagnosis not present

## 2018-02-08 DIAGNOSIS — H353111 Nonexudative age-related macular degeneration, right eye, early dry stage: Secondary | ICD-10-CM | POA: Diagnosis not present

## 2018-02-08 DIAGNOSIS — H43813 Vitreous degeneration, bilateral: Secondary | ICD-10-CM | POA: Diagnosis not present

## 2018-02-08 DIAGNOSIS — I10 Essential (primary) hypertension: Secondary | ICD-10-CM

## 2018-02-08 DIAGNOSIS — H2513 Age-related nuclear cataract, bilateral: Secondary | ICD-10-CM | POA: Diagnosis not present

## 2018-02-19 ENCOUNTER — Other Ambulatory Visit: Payer: Self-pay | Admitting: Cardiovascular Disease

## 2018-04-05 ENCOUNTER — Encounter (INDEPENDENT_AMBULATORY_CARE_PROVIDER_SITE_OTHER): Payer: PPO | Admitting: Ophthalmology

## 2018-04-05 DIAGNOSIS — H43813 Vitreous degeneration, bilateral: Secondary | ICD-10-CM | POA: Diagnosis not present

## 2018-04-05 DIAGNOSIS — H2513 Age-related nuclear cataract, bilateral: Secondary | ICD-10-CM

## 2018-04-05 DIAGNOSIS — H353221 Exudative age-related macular degeneration, left eye, with active choroidal neovascularization: Secondary | ICD-10-CM | POA: Diagnosis not present

## 2018-04-05 DIAGNOSIS — H353112 Nonexudative age-related macular degeneration, right eye, intermediate dry stage: Secondary | ICD-10-CM | POA: Diagnosis not present

## 2018-04-05 DIAGNOSIS — I10 Essential (primary) hypertension: Secondary | ICD-10-CM

## 2018-04-05 DIAGNOSIS — H35033 Hypertensive retinopathy, bilateral: Secondary | ICD-10-CM | POA: Diagnosis not present

## 2018-04-14 ENCOUNTER — Other Ambulatory Visit: Payer: Self-pay | Admitting: Cardiovascular Disease

## 2018-04-20 ENCOUNTER — Ambulatory Visit: Payer: PPO | Admitting: Cardiovascular Disease

## 2018-04-20 ENCOUNTER — Encounter: Payer: Self-pay | Admitting: Cardiovascular Disease

## 2018-04-20 DIAGNOSIS — I480 Paroxysmal atrial fibrillation: Secondary | ICD-10-CM | POA: Diagnosis not present

## 2018-04-20 MED ORDER — FAMOTIDINE 20 MG PO TABS: 20.0000 mg | ORAL_TABLET | Freq: Every day | ORAL | 3 refills | Status: DC

## 2018-04-20 NOTE — Assessment & Plan Note (Signed)
History of paroxysmal atrial fibrillation status post A. fib ablation performed by Dr. Curt Bears 05/07/2016 maintaining sinus rhythm since on Xarelto oral anticoagulation.

## 2018-04-20 NOTE — Progress Notes (Signed)
04/20/2018 Vicki [name]   06/05/1946  [id number]  Primary Physician Plotnikov, Evie Lacks, MD Primary Cardiologist: Lorretta Harp MD Lupe Carney, [place]  HPI:  Vicki [name] is a 72 y.o.  thin-appearing Caucasian female mother of one, grandmother and 6 grandchildren referred by Dr. Alain Marion for cardiovascular evaluation because of newly recognized atrial fibrillation.I last saw her in the office 12/30/2015. She works in Press [employer]. She basically has no chronic risk factors other than hyperlipidemia and family history. Both her mother and father had ischemic heart disease. She has never had a heart attack or stroke. She did poorly have a total fibrillation briefly a +5 years ago and was treated with a low-dose beta blocker which she remains on. She does complain of occasional dyspnea. She saw her primary care physician who noted an irregular heart rhythm on exam and got an EKG that showed atrial fibrillation with a controlled ventricular response. He placed her on an oral anticoagulant (Xarelto). The CHA2DSVASC2 score is 2 . A subsequent 2-D echo was normal and a Myoview was low risk with a small area of anteroapical ischemia although she otherwise is asymptomatic. I was going to cardiovert her but she spontaneously converted to sinus rhythm. She was brought in the hospital in September by Dr. Lovena Le and had a dofetilide  load and last saw Dr. Curt Bears 11/25/15 as well as being followed in our A. Fib clinic. She is maintaining sinus rhythm. She had A. fib ablation performed by Dr. Curt Bears 05/07/2016 and has maintained sinus rhythm since on Xarelto.   Current Meds  Medication Sig  . cholecalciferol (VITAMIN D) 1000 UNITS tablet Take 1,000 Units by mouth 2 (two) times daily.  . famotidine (PEPCID) 20 MG tablet TAKE 1 TABLET BY MOUTH ONCE DAILY  . hydroxypropyl methylcellulose / hypromellose (ISOPTO TEARS / GONIOVISC) 2.5 % ophthalmic solution Place 1 drop into both eyes as needed for  dry eyes.  Marland Kitchen loratadine (CLARITIN) 10 MG tablet Take 10 mg by mouth daily.  . Melatonin 3 MG TABS Take 3 mg by mouth.  . simvastatin (ZOCOR) 20 MG tablet Take 1 tablet (20 mg total) by mouth at bedtime.  . sodium chloride (OCEAN) 0.65 % SOLN nasal spray Place 1 spray into both nostrils at bedtime as needed for congestion.  . triamcinolone ointment (KENALOG) 0.5 % APPLY OINTMENT TOPICALLY TWICE DAILY TO HAND RASH  . trimethoprim-polymyxin b (POLYTRIM) ophthalmic solution Place 1 drop into the left eye See admin instructions. After eye injection, every 5 weeks, 1 drop 4 times daily in Left eye for 2 days  . XARELTO 20 MG TABS tablet TAKE ONE TABLET BY MOUTH ONCE DAILY     Allergies  Allergen Reactions  . Codeine     REACTION: unknown    Social History   Socioeconomic History  . Marital status: Married    Spouse name: Not on file  . Number of children: 1  . Years of education: Not on file  . Highest education level: Not on file  Occupational History  . Occupation: ACCOUNTING    Employer: [employer]  . Financial resource strain: Not hard at all  . Food insecurity:    Worry: Never true    Inability: Never true  . Transportation needs:    Medical: No    Non-medical: No  Tobacco Use  . Smoking status: Former Smoker    Types: Cigarettes    Last attempt to quit: 12/06/1976  Years since quitting: 41.3  . Smokeless tobacco: Never Used  . Tobacco comment: smoked approx 10 years  Substance and Sexual Activity  . Alcohol use: No    Alcohol/week: 0.0 oz  . Drug use: No  . Sexual activity: Not on file  Lifestyle  . Physical activity:    Days per week: 5 days    Minutes per session: 40 min  . Stress: Not at all  Relationships  . Social connections:    Talks on phone: More than three times a week    Gets together: More than three times a week    Attends religious service: More than 4 times per year    Active member of club or organization: Not on file     Attends meetings of clubs or organizations: More than 4 times per year    Relationship status: Married  . Intimate partner violence:    Fear of current or ex partner: No    Emotionally abused: No    Physically abused: No    Forced sexual activity: No  Other Topics Concern  . Not on file  Social History Narrative  . Not on file     Review of Systems: General: negative for chills, fever, night sweats or weight changes.  Cardiovascular: negative for chest pain, dyspnea on exertion, edema, orthopnea, palpitations, paroxysmal nocturnal dyspnea or shortness of breath Dermatological: negative for rash Respiratory: negative for cough or wheezing Urologic: negative for hematuria Abdominal: negative for nausea, vomiting, diarrhea, bright red blood per rectum, melena, or hematemesis Neurologic: negative for visual changes, syncope, or dizziness All other systems reviewed and are otherwise negative except as noted above.    Blood pressure (!) 144/80, pulse 66, height 5\' 3"  (1.6 m), weight 119 lb 9.6 oz (54.3 kg), SpO2 92 %.  General appearance: alert and no distress Neck: no adenopathy, no carotid bruit, no JVD, supple, symmetrical, trachea midline and thyroid not enlarged, symmetric, no tenderness/mass/nodules Lungs: clear to auscultation bilaterally Heart: regular rate and rhythm, S1, S2 normal, no murmur, click, rub or gallop Extremities: extremities normal, atraumatic, no cyanosis or edema Pulses: 2+ and symmetric Skin: Skin color, texture, turgor normal. No rashes or lesions Neurologic: Alert and oriented X 3, normal strength and tone. Normal symmetric reflexes. Normal coordination and gait No changes since last visit  EKG not performed today  ASSESSMENT AND PLAN:   Dyslipidemia History of dyslipidemia on statin therapy with lipid profile performed 07/08/2017 revealing total cholesterol 164, LDL 73 and HDL of 68.  AORTIC INSUFFICIENCY, MILD History of mild aortic insufficiency 2D  echo performed 07/09/2015.  PAROXYSMAL ATRIAL FIBRILLATION History of paroxysmal atrial fibrillation status post A. fib ablation performed by Dr. Curt Bears 05/07/2016 maintaining sinus rhythm since on Xarelto oral anticoagulation.      Lorretta Harp MD FACP,FACC,FAHA, [facility] 04/20/2018 2:29 PM

## 2018-04-20 NOTE — Telephone Encounter (Signed)
This encounter was created in error - please disregard.

## 2018-04-20 NOTE — Assessment & Plan Note (Signed)
History of dyslipidemia on statin therapy with lipid profile performed 07/08/2017 revealing total cholesterol 164, LDL 73 and HDL of 68.

## 2018-04-20 NOTE — Telephone Encounter (Signed)
New message    Add on for today to see Dr Gwenlyn Found @2 :88

## 2018-04-20 NOTE — Patient Instructions (Signed)
Your physician recommends that you schedule a follow-up appointment in: as needed  

## 2018-04-20 NOTE — Assessment & Plan Note (Signed)
History of mild aortic insufficiency 2D echo performed 07/09/2015.

## 2018-05-31 ENCOUNTER — Encounter (INDEPENDENT_AMBULATORY_CARE_PROVIDER_SITE_OTHER): Payer: PPO | Admitting: Ophthalmology

## 2018-05-31 DIAGNOSIS — H2513 Age-related nuclear cataract, bilateral: Secondary | ICD-10-CM

## 2018-05-31 DIAGNOSIS — H353112 Nonexudative age-related macular degeneration, right eye, intermediate dry stage: Secondary | ICD-10-CM | POA: Diagnosis not present

## 2018-05-31 DIAGNOSIS — H43813 Vitreous degeneration, bilateral: Secondary | ICD-10-CM | POA: Diagnosis not present

## 2018-05-31 DIAGNOSIS — H353221 Exudative age-related macular degeneration, left eye, with active choroidal neovascularization: Secondary | ICD-10-CM | POA: Diagnosis not present

## 2018-05-31 DIAGNOSIS — H35033 Hypertensive retinopathy, bilateral: Secondary | ICD-10-CM | POA: Diagnosis not present

## 2018-05-31 DIAGNOSIS — I10 Essential (primary) hypertension: Secondary | ICD-10-CM | POA: Diagnosis not present

## 2018-07-10 ENCOUNTER — Encounter: Payer: Self-pay | Admitting: Internal Medicine

## 2018-07-10 ENCOUNTER — Other Ambulatory Visit (INDEPENDENT_AMBULATORY_CARE_PROVIDER_SITE_OTHER): Payer: PPO

## 2018-07-10 ENCOUNTER — Ambulatory Visit (INDEPENDENT_AMBULATORY_CARE_PROVIDER_SITE_OTHER): Payer: PPO | Admitting: Internal Medicine

## 2018-07-10 DIAGNOSIS — I480 Paroxysmal atrial fibrillation: Secondary | ICD-10-CM | POA: Diagnosis not present

## 2018-07-10 DIAGNOSIS — E785 Hyperlipidemia, unspecified: Secondary | ICD-10-CM

## 2018-07-10 DIAGNOSIS — F411 Generalized anxiety disorder: Secondary | ICD-10-CM | POA: Diagnosis not present

## 2018-07-10 LAB — CBC WITH DIFFERENTIAL/PLATELET
Basophils Absolute: 0 10*3/uL (ref 0.0–0.1)
Basophils Relative: 0.7 % (ref 0.0–3.0)
EOS ABS: 0 10*3/uL (ref 0.0–0.7)
Eosinophils Relative: 0.1 % (ref 0.0–5.0)
HCT: 37.2 % (ref 36.0–46.0)
HEMOGLOBIN: 12.7 g/dL (ref 12.0–15.0)
LYMPHS ABS: 1.2 10*3/uL (ref 0.7–4.0)
Lymphocytes Relative: 23.2 % (ref 12.0–46.0)
MCHC: 34.2 g/dL (ref 30.0–36.0)
MCV: 96.4 fl (ref 78.0–100.0)
MONO ABS: 0.7 10*3/uL (ref 0.1–1.0)
Monocytes Relative: 13.3 % — ABNORMAL HIGH (ref 3.0–12.0)
NEUTROS PCT: 62.7 % (ref 43.0–77.0)
Neutro Abs: 3.1 10*3/uL (ref 1.4–7.7)
Platelets: 288 10*3/uL (ref 150.0–400.0)
RBC: 3.86 Mil/uL — AB (ref 3.87–5.11)
RDW: 12.8 % (ref 11.5–15.5)
WBC: 5 10*3/uL (ref 4.0–10.5)

## 2018-07-10 LAB — BASIC METABOLIC PANEL
BUN: 12 mg/dL (ref 6–23)
CHLORIDE: 100 meq/L (ref 96–112)
CO2: 30 meq/L (ref 19–32)
Calcium: 9.4 mg/dL (ref 8.4–10.5)
Creatinine, Ser: 0.65 mg/dL (ref 0.40–1.20)
GFR: 95.28 mL/min (ref >60.00)
GLUCOSE: 103 mg/dL — AB (ref 70–99)
POTASSIUM: 4.3 meq/L (ref 3.5–5.1)
Sodium: 137 mEq/L (ref 135–145)

## 2018-07-10 LAB — URINALYSIS
BILIRUBIN URINE: NEGATIVE
HGB URINE DIPSTICK: NEGATIVE
Ketones, ur: NEGATIVE
Leukocytes, UA: NEGATIVE
Nitrite: NEGATIVE
Specific Gravity, Urine: 1.01 (ref 1.000–1.030)
Total Protein, Urine: NEGATIVE
URINE GLUCOSE: NEGATIVE
UROBILINOGEN UA: 1 (ref 0.0–1.0)
pH: 7 (ref 5.0–8.0)

## 2018-07-10 LAB — LIPID PANEL
Cholesterol: 151 mg/dL (ref 0–200)
HDL: 60.9 mg/dL (ref >39.00)
LDL CALC: 66 mg/dL (ref 0–99)
NONHDL: 89.71
Total CHOL/HDL Ratio: 2
Triglycerides: 119 mg/dL (ref 0.0–149.0)
VLDL: 23.8 mg/dL (ref 0.0–40.0)

## 2018-07-10 LAB — HEPATIC FUNCTION PANEL
ALT: 12 U/L (ref 0–35)
AST: 17 U/L (ref 0–37)
Albumin: 4.2 g/dL (ref 3.5–5.2)
Alkaline Phosphatase: 92 U/L (ref 39–117)
BILIRUBIN DIRECT: 0.1 mg/dL (ref 0.0–0.3)
BILIRUBIN TOTAL: 0.6 mg/dL (ref 0.2–1.2)
Total Protein: 7 g/dL (ref 6.0–8.3)

## 2018-07-10 LAB — TSH: TSH: 2.53 u[IU]/mL (ref 0.35–4.50)

## 2018-07-10 NOTE — Assessment & Plan Note (Signed)
Doing well 

## 2018-07-10 NOTE — Patient Instructions (Signed)
MC well w/Jill 

## 2018-07-10 NOTE — Assessment & Plan Note (Signed)
Xarelto

## 2018-07-10 NOTE — Progress Notes (Signed)
Subjective:  Patient ID: Vicki [name], female    DOB: 1946/02/15  Age: 72 y.o. MRN: [id number]  CC: No chief complaint on file.   HPI Vicki [name] presents for A fib, dyslipidemia, anxiety f/u  Outpatient Medications Prior to Visit  Medication Sig Dispense Refill  . cholecalciferol (VITAMIN D) 1000 UNITS tablet Take 1,000 Units by mouth 2 (two) times daily.    . famotidine (PEPCID) 20 MG tablet Take 1 tablet (20 mg total) by mouth daily. 90 tablet 3  . hydroxypropyl methylcellulose / hypromellose (ISOPTO TEARS / GONIOVISC) 2.5 % ophthalmic solution Place 1 drop into both eyes as needed for dry eyes.    Marland Kitchen loratadine (CLARITIN) 10 MG tablet Take 10 mg by mouth daily.    . Melatonin 3 MG TABS Take 3 mg by mouth.    . simvastatin (ZOCOR) 20 MG tablet Take 1 tablet (20 mg total) by mouth at bedtime. 90 tablet 3  . sodium chloride (OCEAN) 0.65 % SOLN nasal spray Place 1 spray into both nostrils at bedtime as needed for congestion.    . triamcinolone ointment (KENALOG) 0.5 % APPLY OINTMENT TOPICALLY TWICE DAILY TO HAND RASH 45 g 3  . trimethoprim-polymyxin b (POLYTRIM) ophthalmic solution Place 1 drop into the left eye See admin instructions. After eye injection, every 5 weeks, 1 drop 4 times daily in Left eye for 2 days    . XARELTO 20 MG TABS tablet TAKE ONE TABLET BY MOUTH ONCE DAILY 30 tablet 7   No facility-administered medications prior to visit.     ROS: Review of Systems  Constitutional: Negative for activity change, appetite change, chills, fatigue and unexpected weight change.  HENT: Negative for congestion, mouth sores and sinus pressure.   Eyes: Negative for visual disturbance.  Respiratory: Negative for cough and chest tightness.   Gastrointestinal: Negative for abdominal pain and nausea.  Genitourinary: Negative for difficulty urinating, frequency and vaginal pain.  Musculoskeletal: Negative for back pain and gait problem.  Skin: Negative for pallor and rash.    Neurological: Negative for dizziness, tremors, weakness, numbness and headaches.  Psychiatric/Behavioral: Negative for confusion and sleep disturbance.    Objective:  BP 122/74 (BP Location: Right Arm, Patient Position: Sitting, Cuff Size: Normal)   Pulse 62   Temp 98.1 F (36.7 C) (Oral)   Ht 5\' 3"  (1.6 m)   Wt 120 lb (54.4 kg)   SpO2 96%   BMI 21.26 kg/m   BP Readings from Last 3 Encounters:  07/10/18 122/74  04/20/18 (!) 144/80  01/31/18 (!) 144/80    Wt Readings from Last 3 Encounters:  07/10/18 120 lb (54.4 kg)  04/20/18 119 lb 9.6 oz (54.3 kg)  01/31/18 120 lb (54.4 kg)    Physical Exam  Constitutional: She appears well-developed. No distress.  HENT:  Head: Normocephalic.  Right Ear: External ear normal.  Left Ear: External ear normal.  Nose: Nose normal.  Mouth/Throat: Oropharynx is clear and moist.  Eyes: Pupils are equal, round, and reactive to light. Conjunctivae are normal. Right eye exhibits no discharge. Left eye exhibits no discharge.  Neck: Normal range of motion. Neck supple. No JVD present. No tracheal deviation present. No thyromegaly present.  Cardiovascular: Normal rate, regular rhythm and normal heart sounds.  Pulmonary/Chest: No stridor. No respiratory distress. She has no wheezes.  Abdominal: Soft. Bowel sounds are normal. She exhibits no distension and no mass. There is no tenderness. There is no rebound and no guarding.  Musculoskeletal: She exhibits  no edema or tenderness.  Lymphadenopathy:    She has no cervical adenopathy.  Neurological: She displays normal reflexes. No cranial nerve deficit. She exhibits normal muscle tone. Coordination normal.  Skin: No rash noted. No erythema.  Psychiatric: She has a normal mood and affect. Her behavior is normal. Judgment and thought content normal.    Lab Results  Component Value Date   WBC 6.0 07/08/2017   HGB 13.0 07/08/2017   HCT 39.0 07/08/2017   PLT 288.0 07/08/2017   GLUCOSE 98 01/09/2018    CHOL 164 07/08/2017   TRIG 112.0 07/08/2017   HDL 68.60 07/08/2017   LDLCALC 73 07/08/2017   ALT 31 01/06/2017   AST <3 01/06/2017   NA 137 01/09/2018   K 4.1 01/09/2018   CL 99 01/09/2018   CREATININE 0.60 01/09/2018   BUN 12 01/09/2018   CO2 29 01/09/2018   TSH 2.08 07/08/2017   INR 1.41 08/07/2015    Mm Screening Breast Tomo Bilateral  Result Date: 12/09/2017 CLINICAL DATA:  Screening. EXAM: 2D DIGITAL SCREENING BILATERAL MAMMOGRAM WITH 3D TOMO WITH CAD COMPARISON:  Previous exam(s). ACR Breast Density Category b: There are scattered areas of fibroglandular density. FINDINGS: There are no findings suspicious for malignancy. Images were processed with CAD. IMPRESSION: No mammographic evidence of malignancy. A result letter of this screening mammogram will be mailed directly to the patient. RECOMMENDATION: Screening mammogram in one year. (Code:SM-B-01Y) BI-RADS CATEGORY  1: Negative. Electronically Signed   By: Dorise Bullion III M.D   On: 12/09/2017 07:46    Assessment & Plan:   There are no diagnoses linked to this encounter.   No orders of the defined types were placed in this encounter.    Follow-up: No follow-ups on file.  Walker Kehr, MD

## 2018-07-10 NOTE — Assessment & Plan Note (Signed)
On Simvastatin 

## 2018-07-26 ENCOUNTER — Encounter (INDEPENDENT_AMBULATORY_CARE_PROVIDER_SITE_OTHER): Payer: PPO | Admitting: Ophthalmology

## 2018-07-26 DIAGNOSIS — H35033 Hypertensive retinopathy, bilateral: Secondary | ICD-10-CM | POA: Diagnosis not present

## 2018-07-26 DIAGNOSIS — H2513 Age-related nuclear cataract, bilateral: Secondary | ICD-10-CM | POA: Diagnosis not present

## 2018-07-26 DIAGNOSIS — I10 Essential (primary) hypertension: Secondary | ICD-10-CM

## 2018-07-26 DIAGNOSIS — H353112 Nonexudative age-related macular degeneration, right eye, intermediate dry stage: Secondary | ICD-10-CM | POA: Diagnosis not present

## 2018-07-26 DIAGNOSIS — H353221 Exudative age-related macular degeneration, left eye, with active choroidal neovascularization: Secondary | ICD-10-CM | POA: Diagnosis not present

## 2018-07-26 DIAGNOSIS — H43813 Vitreous degeneration, bilateral: Secondary | ICD-10-CM

## 2018-08-27 ENCOUNTER — Other Ambulatory Visit: Payer: Self-pay | Admitting: Internal Medicine

## 2018-09-20 ENCOUNTER — Encounter (INDEPENDENT_AMBULATORY_CARE_PROVIDER_SITE_OTHER): Payer: PPO | Admitting: Ophthalmology

## 2018-09-20 DIAGNOSIS — H43813 Vitreous degeneration, bilateral: Secondary | ICD-10-CM

## 2018-09-20 DIAGNOSIS — H353221 Exudative age-related macular degeneration, left eye, with active choroidal neovascularization: Secondary | ICD-10-CM | POA: Diagnosis not present

## 2018-09-20 DIAGNOSIS — I10 Essential (primary) hypertension: Secondary | ICD-10-CM | POA: Diagnosis not present

## 2018-09-20 DIAGNOSIS — H2513 Age-related nuclear cataract, bilateral: Secondary | ICD-10-CM

## 2018-09-20 DIAGNOSIS — H35033 Hypertensive retinopathy, bilateral: Secondary | ICD-10-CM

## 2018-09-20 DIAGNOSIS — H353112 Nonexudative age-related macular degeneration, right eye, intermediate dry stage: Secondary | ICD-10-CM

## 2018-10-13 ENCOUNTER — Ambulatory Visit (INDEPENDENT_AMBULATORY_CARE_PROVIDER_SITE_OTHER): Payer: PPO | Admitting: Family

## 2018-10-13 ENCOUNTER — Encounter: Payer: Self-pay | Admitting: Family

## 2018-10-13 VITALS — BP 130/70 | HR 67 | Temp 98.3°F | Ht 63.0 in | Wt 120.0 lb

## 2018-10-13 DIAGNOSIS — J209 Acute bronchitis, unspecified: Secondary | ICD-10-CM

## 2018-10-13 MED ORDER — FLUTICASONE PROPIONATE 50 MCG/ACT NA SUSP: 2.0000 | Freq: Every day | NASAL | 6 refills | Status: DC

## 2018-10-13 MED ORDER — BENZONATATE 100 MG PO CAPS: 100.0000 mg | ORAL_CAPSULE | Freq: Three times a day (TID) | ORAL | 0 refills | Status: DC | PRN

## 2018-10-13 MED ORDER — AZITHROMYCIN 250 MG PO TABS: ORAL_TABLET | ORAL | 0 refills | Status: DC

## 2018-10-13 NOTE — Progress Notes (Signed)
Vicki [name] is a 72 y.o. female with the following history as recorded in Epic[employer]:  Patient Active Problem List   Diagnosis Date Noted  . Insomnia 01/09/2018  . Ankle sprain 07/08/2017  . Weight loss 01/06/2017  . Neoplasm of uncertain behavior of skin 04/16/2016  . Solitary pulmonary nodule 10/06/2015  . Visit for monitoring Tikosyn therapy 09/06/2015  . Epistaxis 08/08/2015  . Cerumen impaction 07/04/2015  . Abdominal bloating 01/03/2015  . Cough 03/20/2014  . Well adult exam 03/19/2014  . Elevated BP 03/19/2014  . Shingles rash 01/22/2013  . PAROXYSMAL ATRIAL FIBRILLATION 02/10/2011  . RECTAL BLEEDING 08/07/2009  . COLONIC POLYPS, HYPERPLASTIC, HX OF 08/04/2009  . ADENOCARCINOMA, BREAST 09/07/2008  . AORTIC INSUFFICIENCY, MILD 09/07/2008  . DIVERTICULOSIS OF COLON 09/07/2008  . DEGENERATIVE JOINT DISEASE 09/07/2008  . Dyslipidemia 07/04/2008  . Anxiety state 07/04/2008  . ALLERGIC RHINITIS 07/04/2008  . GERD 07/04/2008  . CONTACT DERMATITIS 07/04/2008  . Osteopenia 07/04/2008    Current Outpatient Medications  Medication Sig Dispense Refill  . cholecalciferol (VITAMIN D) 1000 UNITS tablet Take 1,000 Units by mouth 2 (two) times daily.    . famotidine (PEPCID) 20 MG tablet Take 1 tablet (20 mg total) by mouth daily. 90 tablet 3  . hydroxypropyl methylcellulose / hypromellose (ISOPTO TEARS / GONIOVISC) 2.5 % ophthalmic solution Place 1 drop into both eyes as needed for dry eyes.    Marland Kitchen loratadine (CLARITIN) 10 MG tablet Take 10 mg by mouth daily.    . Melatonin 3 MG TABS Take 3 mg by mouth.    . Omega-3 Fatty Acids (FISH OIL) 1000 MG CAPS Take by mouth.    . simvastatin (ZOCOR) 20 MG tablet TAKE 1 TABLET BY MOUTH AT BEDTIME 90 tablet 3  . sodium chloride (OCEAN) 0.65 % SOLN nasal spray Place 1 spray into both nostrils at bedtime as needed for congestion.    . triamcinolone ointment (KENALOG) 0.5 % APPLY OINTMENT TOPICALLY TWICE DAILY TO HAND RASH 45 g 3  .  trimethoprim-polymyxin b (POLYTRIM) ophthalmic solution Place 1 drop into the left eye See admin instructions. After eye injection, every 5 weeks, 1 drop 4 times daily in Left eye for 2 days    . XARELTO 20 MG TABS tablet TAKE ONE TABLET BY MOUTH ONCE DAILY 30 tablet 7  . azithromycin (ZITHROMAX) 250 MG tablet 2 tabs po qd x 1 day; 1 tablet per day x 4 days; 6 tablet 0  . benzonatate (TESSALON) 100 MG capsule Take 1 capsule (100 mg total) by mouth 3 (three) times daily as needed. 20 capsule 0  . fluticasone (FLONASE) 50 MCG/ACT nasal spray Place 2 sprays into both nostrils daily. 16 g 6   No current facility-administered medications for this visit.     Allergies: Codeine  Past Medical History:  Diagnosis Date  . Adenocarcinoma, breast ([place])   . Allergic rhinitis   . Anxiety   . Aortic insufficiency   . Breast cancer [facility]) 2008   Left Breast Cancer  . Contact dermatitis   . Diverticulosis of colon   . DJD (degenerative joint disease)   . GERD (gastroesophageal reflux disease)   . Hx of colonic polyps   . Hyperlipidemia   . Osteopenia   . Paroxysmal atrial fibrillation ([facility])    A. fib is now persistent.  . Personal history of radiation therapy 2008   Left Breast Cancer  . Visit for monitoring Tikosyn therapy 09/06/2015    Past Surgical History:  Procedure  Laterality Date  . ABDOMINAL HYSTERECTOMY  1984   with 1 ovary removal  . BREAST LUMPECTOMY Left 08/2007    with node bx  . ELECTROPHYSIOLOGIC STUDY N/A 05/07/2016   Procedure: Atrial Fibrillation Ablation;  Surgeon: Will Meredith Leeds, MD;  Location: [place] [facility];  Service: Cardiovascular;  Laterality: N/A;    Family History  Problem Relation Age of Onset  . Heart disease Father 52  . Heart attack Father   . Other Mother 72       AVR  . Heart disease Mother   . Clotting disorder Mother 74  . Breast cancer Sister   . Lymphoma Sister 60  . Hypertension Sister   . Healthy Brother   . Other Maternal Grandmother      Social History   Tobacco Use  . Smoking status: Former Smoker    Types: Cigarettes    Last attempt to quit: 12/06/1976    Years since quitting: 41.8  . Smokeless tobacco: Never Used  . Tobacco comment: smoked approx 10 years  Substance Use Topics  . Alcohol use: No    Alcohol/week: 0.0 standard drinks    Subjective:  Patient presents with cough/ congestion x 1 week; started with sore throat/ headache initially and has progressed into cough; coughing spells; no fever; feels tight in chest; no OTC medications;    Objective:  Vitals:   10/13/18 1001  BP: 130/70  Pulse: 67  Temp: 98.3 F (36.8 C)  TempSrc: Oral  SpO2: 95%  Weight: 120 lb 0.6 oz (54.4 kg)  Height: 5\' 3"  (1.6 m)    General: Well developed, well nourished, in no acute distress  Skin : Warm and dry.  Head: Normocephalic and atraumatic  Eyes: Sclera and conjunctiva clear; pupils round and reactive to light; extraocular movements intact  Ears: External normal; canals clear; tympanic membranes normal  Oropharynx: Pink, supple. No suspicious lesions  Neck: Supple without thyromegaly, adenopathy  Lungs: Respirations unlabored; clear to auscultation bilaterally without wheeze, rales, rhonchi  CVS exam: normal rate and regular rhythm.  Neurologic: Alert and oriented; speech intact; face symmetrical; moves all extremities well; CNII-XII intact without focal deficit   Assessment:  1. Acute bronchitis, unspecified organism     Plan:  Rx for Z-pak #1 take as directed, Flonase and Tessalon Perles; increase fluids, rest and follow-up worse, no better.   No follow-ups on file.  No orders of the defined types were placed in this encounter.   Requested Prescriptions   Signed Prescriptions Disp Refills  . fluticasone (FLONASE) 50 MCG/ACT nasal spray 16 g 6    Sig: Place 2 sprays into both nostrils daily.  . benzonatate (TESSALON) 100 MG capsule 20 capsule 0    Sig: Take 1 capsule (100 mg total) by mouth 3 (three) times  daily as needed.  Marland Kitchen azithromycin (ZITHROMAX) 250 MG tablet 6 tablet 0    Sig: 2 tabs po qd x 1 day; 1 tablet per day x 4 days;

## 2018-10-30 ENCOUNTER — Ambulatory Visit (INDEPENDENT_AMBULATORY_CARE_PROVIDER_SITE_OTHER)
Admission: RE | Admit: 2018-10-30 | Discharge: 2018-10-30 | Disposition: A | Discharge status: Home / Self Care | Payer: PPO | Source: Ambulatory Visit | Attending: Internal Medicine | Admitting: Internal Medicine

## 2018-10-30 ENCOUNTER — Ambulatory Visit (INDEPENDENT_AMBULATORY_CARE_PROVIDER_SITE_OTHER): Payer: PPO | Admitting: Internal Medicine

## 2018-10-30 ENCOUNTER — Ambulatory Visit: Payer: Self-pay | Admitting: *Deleted

## 2018-10-30 ENCOUNTER — Encounter: Payer: Self-pay | Admitting: Internal Medicine

## 2018-10-30 DIAGNOSIS — R05 Cough: Secondary | ICD-10-CM

## 2018-10-30 DIAGNOSIS — R0789 Other chest pain: Secondary | ICD-10-CM | POA: Insufficient documentation

## 2018-10-30 DIAGNOSIS — I480 Paroxysmal atrial fibrillation: Secondary | ICD-10-CM | POA: Diagnosis not present

## 2018-10-30 DIAGNOSIS — R079 Chest pain, unspecified: Secondary | ICD-10-CM | POA: Diagnosis not present

## 2018-10-30 DIAGNOSIS — R911 Solitary pulmonary nodule: Secondary | ICD-10-CM

## 2018-10-30 DIAGNOSIS — R059 Cough, unspecified: Secondary | ICD-10-CM

## 2018-10-30 MED ORDER — CEFDINIR 300 MG PO CAPS: 300.0000 mg | ORAL_CAPSULE | Freq: Two times a day (BID) | ORAL | 0 refills | Status: DC

## 2018-10-30 NOTE — Assessment & Plan Note (Addendum)
R lower posterior ribs are tender to palpation ?etiol CXR Ribs X ray Cefdinir Pt declined D dimer (she is on Xarelto)

## 2018-10-30 NOTE — Assessment & Plan Note (Signed)
Repeat chest CT in 2/19

## 2018-10-30 NOTE — Progress Notes (Signed)
Subjective:  Patient ID: Vicki [name], female    DOB: 11-15-1946  Age: 72 y.o. MRN: [id number]  CC: No chief complaint on file.   HPI Vicki [name] presents for cough, R posterior CP w/coughing, breathing, moving...  4/10 pain  Outpatient Medications Prior to Visit  Medication Sig Dispense Refill  . benzonatate (TESSALON) 100 MG capsule Take 1 capsule (100 mg total) by mouth 3 (three) times daily as needed. 20 capsule 0  . cholecalciferol (VITAMIN D) 1000 UNITS tablet Take 1,000 Units by mouth 2 (two) times daily.    . famotidine (PEPCID) 20 MG tablet Take 1 tablet (20 mg total) by mouth daily. 90 tablet 3  . fluticasone (FLONASE) 50 MCG/ACT nasal spray Place 2 sprays into both nostrils daily. 16 g 6  . hydroxypropyl methylcellulose / hypromellose (ISOPTO TEARS / GONIOVISC) 2.5 % ophthalmic solution Place 1 drop into both eyes as needed for dry eyes.    Marland Kitchen loratadine (CLARITIN) 10 MG tablet Take 10 mg by mouth daily.    . Melatonin 3 MG TABS Take 3 mg by mouth.    . Omega-3 Fatty Acids (FISH OIL) 1000 MG CAPS Take by mouth.    . simvastatin (ZOCOR) 20 MG tablet TAKE 1 TABLET BY MOUTH AT BEDTIME 90 tablet 3  . sodium chloride (OCEAN) 0.65 % SOLN nasal spray Place 1 spray into both nostrils at bedtime as needed for congestion.    . triamcinolone ointment (KENALOG) 0.5 % APPLY OINTMENT TOPICALLY TWICE DAILY TO HAND RASH 45 g 3  . trimethoprim-polymyxin b (POLYTRIM) ophthalmic solution Place 1 drop into the left eye See admin instructions. After eye injection, every 5 weeks, 1 drop 4 times daily in Left eye for 2 days    . XARELTO 20 MG TABS tablet TAKE ONE TABLET BY MOUTH ONCE DAILY 30 tablet 7  . azithromycin (ZITHROMAX) 250 MG tablet 2 tabs po qd x 1 day; 1 tablet per day x 4 days; 6 tablet 0   No facility-administered medications prior to visit.     ROS: Review of Systems  Constitutional: Negative for activity change, appetite change, chills, fatigue and unexpected weight  change.  HENT: Negative for congestion, mouth sores and sinus pressure.   Eyes: Negative for visual disturbance.  Respiratory: Positive for cough. Negative for chest tightness.   Cardiovascular: Positive for chest pain.  Gastrointestinal: Negative for abdominal pain and nausea.  Genitourinary: Negative for difficulty urinating, frequency and vaginal pain.  Musculoskeletal: Negative for back pain and gait problem.  Skin: Negative for pallor and rash.  Neurological: Negative for dizziness, tremors, weakness, numbness and headaches.  Psychiatric/Behavioral: Negative for confusion, sleep disturbance and suicidal ideas.    Objective:  BP 116/72 (BP Location: Left Arm, Patient Position: Sitting, Cuff Size: Normal)   Pulse 64   Temp 98 F (36.7 C) (Oral)   Ht 5\' 3"  (1.6 m)   Wt 120 lb (54.4 kg)   SpO2 97%   BMI 21.26 kg/m   BP Readings from Last 3 Encounters:  10/30/18 116/72  10/13/18 130/70  07/10/18 122/74    Wt Readings from Last 3 Encounters:  10/30/18 120 lb (54.4 kg)  10/13/18 120 lb 0.6 oz (54.4 kg)  07/10/18 120 lb (54.4 kg)    Physical Exam  Constitutional: She appears well-developed. No distress.  HENT:  Head: Normocephalic.  Right Ear: External ear normal.  Left Ear: External ear normal.  Nose: Nose normal.  Mouth/Throat: Oropharynx is clear and moist.  Eyes:  Pupils are equal, round, and reactive to light. Conjunctivae are normal. Right eye exhibits no discharge. Left eye exhibits no discharge.  Neck: Normal range of motion. Neck supple. No JVD present. No tracheal deviation present. No thyromegaly present.  Cardiovascular: Normal rate, regular rhythm and normal heart sounds.  Pulmonary/Chest: No stridor. No respiratory distress. She has no wheezes. She exhibits tenderness.  Abdominal: Soft. Bowel sounds are normal. She exhibits no distension and no mass. There is no tenderness. There is no rebound and no guarding.  Musculoskeletal: She exhibits no edema or  tenderness.  Lymphadenopathy:    She has no cervical adenopathy.  Neurological: She displays normal reflexes. No cranial nerve deficit. She exhibits normal muscle tone. Coordination normal.  Skin: No rash noted. No erythema.  Psychiatric: She has a normal mood and affect. Her behavior is normal. Judgment and thought content normal.  R lower posterior ribs are tender to palpation  Lab Results  Component Value Date   WBC 5.0 07/10/2018   HGB 12.7 07/10/2018   HCT 37.2 07/10/2018   PLT 288.0 07/10/2018   GLUCOSE 103 (H) 07/10/2018   CHOL 151 07/10/2018   TRIG 119.0 07/10/2018   HDL 60.90 07/10/2018   LDLCALC 66 07/10/2018   ALT 12 07/10/2018   AST 17 07/10/2018   NA 137 07/10/2018   K 4.3 07/10/2018   CL 100 07/10/2018   CREATININE 0.65 07/10/2018   BUN 12 07/10/2018   CO2 30 07/10/2018   TSH 2.53 07/10/2018   INR 1.41 08/07/2015    Mm Screening Breast Tomo Bilateral  Result Date: 12/09/2017 CLINICAL DATA:  Screening. EXAM: 2D DIGITAL SCREENING BILATERAL MAMMOGRAM WITH 3D TOMO WITH CAD COMPARISON:  Previous exam(s). ACR Breast Density Category b: There are scattered areas of fibroglandular density. FINDINGS: There are no findings suspicious for malignancy. Images were processed with CAD. IMPRESSION: No mammographic evidence of malignancy. A result letter of this screening mammogram will be mailed directly to the patient. RECOMMENDATION: Screening mammogram in one year. (Code:SM-B-01Y) BI-RADS CATEGORY  1: Negative. Electronically Signed   By: Dorise Bullion III M.D   On: 12/09/2017 07:46    Assessment & Plan:   There are no diagnoses linked to this encounter.   No orders of the defined types were placed in this encounter.    Follow-up: No follow-ups on file.  Walker Kehr, MD

## 2018-10-30 NOTE — Assessment & Plan Note (Signed)
Xarelto

## 2018-10-30 NOTE — Assessment & Plan Note (Addendum)
R lower posterior ribs are tender to palpation ?etiol CXR Ribs X ray Pt declined D-dimer

## 2018-10-30 NOTE — Patient Instructions (Addendum)
You can use over-the-counter  "cold" medicines  such as " Delsym" or" Robitussin" cough syrup varietis for cough.  You can use plain "Tylenol" for fever, chills and achyness. Use Halls or Ricola cough drops.    Please, make an appointment if you are not better or if you're worse.

## 2018-10-30 NOTE — Telephone Encounter (Signed)
Contacted pt regarding symptoms; she says that she is still coughing, and it feels tight/sore on her right side and back when she coughs; she says that she is coughing up clear, thick sputum; the pt was seen in the office on 10/13/18 with diagnosis bronchitis; she reports congestion in her chest and right side; the pt also says that her cough is worse when talking or after eating/drinking; recommendations made per nurse triage protocol' pt offered and accepted appointment with Dr Alain Marion, [facility], 10/30/18 at 1120; she verbalized understanding; will route to office for notification of upcoming appointment.  Reason for Disposition . Cough has been present for > 3 weeks  Answer Assessment - Initial Assessment Questions 1. ONSET: "When did the cough begin?"      Seen in office 10/13/18 2. SEVERITY: "How bad is the cough today?"      moderate 3. RESPIRATORY DISTRESS: "Describe your breathing."      Breathing ok; soreness when she takes a breath 4. FEVER: "Do you have a fever?" If so, ask: "What is your temperature, how was it measured, and when did it start?"     no 5. SPUTUM: "Describe the color of your sputum" (clear, white, yellow, green)     Clear, thick 6. HEMOPTYSIS: "Are you coughing up any blood?" If so ask: "How much?" (flecks, streaks, tablespoons, etc.)     no 7. CARDIAC HISTORY: "Do you have any history of heart disease?" (e.g., heart attack, congestive heart failure)      Afib; takes xerelto for afib 8. LUNG HISTORY: "Do you have any history of lung disease?"  (e.g., pulmonary embolus, asthma, emphysema)     Diagnosed with bronchitis 10/13/18 9. PE RISK FACTORS: "Do you have a history of blood clots?" (or: recent major surgery, recent prolonged travel, bedridden)     no 10. OTHER SYMPTOMS: "Do you have any other symptoms?" (e.g., runny nose, wheezing, chest pain)       Soreness and tightness on right side and back 11. PREGNANCY: "Is there any chance you are pregnant?" "When was your  last menstrual period?"       no 12. TRAVEL: "Have you traveled out of the country in the last month?" (e.g., travel history, exposures)       no  Protocols used: [place]

## 2018-11-19 ENCOUNTER — Other Ambulatory Visit: Payer: Self-pay | Admitting: Internal Medicine

## 2018-11-22 ENCOUNTER — Encounter (INDEPENDENT_AMBULATORY_CARE_PROVIDER_SITE_OTHER): Payer: PPO | Admitting: Ophthalmology

## 2018-11-22 DIAGNOSIS — H353221 Exudative age-related macular degeneration, left eye, with active choroidal neovascularization: Secondary | ICD-10-CM | POA: Diagnosis not present

## 2018-11-22 DIAGNOSIS — H43813 Vitreous degeneration, bilateral: Secondary | ICD-10-CM | POA: Diagnosis not present

## 2018-11-22 DIAGNOSIS — H35033 Hypertensive retinopathy, bilateral: Secondary | ICD-10-CM | POA: Diagnosis not present

## 2018-11-22 DIAGNOSIS — I10 Essential (primary) hypertension: Secondary | ICD-10-CM | POA: Diagnosis not present

## 2018-11-22 DIAGNOSIS — H353112 Nonexudative age-related macular degeneration, right eye, intermediate dry stage: Secondary | ICD-10-CM

## 2018-12-11 ENCOUNTER — Other Ambulatory Visit: Payer: Self-pay | Admitting: Internal Medicine

## 2018-12-11 NOTE — Telephone Encounter (Signed)
Copied from [place] [id number]. Topic: Quick Communication - Rx Refill/Question >> Dec 11, 2018  1:23 PM Windy Kalata wrote: Medication:  XARELTO 20 MG TABS tablet  Has the patient contacted their pharmacy? Yes.   (Agent: If no, request that the patient contact the pharmacy for the refill.) (Agent: If yes, when and what did the pharmacy advise?) Pharmacy advised patient to call and get a 90 day supply that is cheaper for her  Preferred Pharmacy (with phone number or street name): [facility], [place] [phone] (Phone) [phone] (Fax)    Agent: Please be advised that RX refills may take up to 3 business days. We ask that you follow-up with your pharmacy.

## 2018-12-12 ENCOUNTER — Telehealth: Payer: Self-pay | Admitting: *Deleted

## 2018-12-12 MED ORDER — RIVAROXABAN 20 MG PO TABS: 20.0000 mg | ORAL_TABLET | Freq: Every day | ORAL | 1 refills | Status: DC

## 2018-12-12 NOTE — Telephone Encounter (Signed)
Pt last saw Dr Gwenlyn Found 04/20/18, last labs 07/10/18 Creat 0.65, age 73, weight 54.4kg, CrCl 67.19, based on CrCl pt is on appropriate dosage of Xarelto 20mg  QD.  Will refill rx.

## 2018-12-12 NOTE — Telephone Encounter (Deleted)
Pt requested refill for xarelto, written by a historical provider.

## 2018-12-12 NOTE — Telephone Encounter (Signed)
Patient phoned in requesting Rx for Xarelto 20 mg 90 days be sent to [facility] in [place]  Will forward to Pharm D

## 2018-12-12 NOTE — Telephone Encounter (Signed)
Pt requesting a 90 supply of Xarelto 20 mg tab. Advised to call her cardiologist. He is doing the refills on this medication.

## 2018-12-15 ENCOUNTER — Other Ambulatory Visit: Payer: Self-pay | Admitting: Internal Medicine

## 2018-12-15 DIAGNOSIS — Z1231 Encounter for screening mammogram for malignant neoplasm of breast: Secondary | ICD-10-CM

## 2019-01-10 ENCOUNTER — Encounter: Payer: Self-pay | Admitting: Internal Medicine

## 2019-01-10 ENCOUNTER — Other Ambulatory Visit (INDEPENDENT_AMBULATORY_CARE_PROVIDER_SITE_OTHER): Payer: PPO

## 2019-01-10 ENCOUNTER — Ambulatory Visit (INDEPENDENT_AMBULATORY_CARE_PROVIDER_SITE_OTHER): Payer: PPO | Admitting: Internal Medicine

## 2019-01-10 VITALS — BP 124/76 | HR 61 | Temp 98.0°F | Ht 63.0 in | Wt 120.0 lb

## 2019-01-10 DIAGNOSIS — R634 Abnormal weight loss: Secondary | ICD-10-CM | POA: Diagnosis not present

## 2019-01-10 DIAGNOSIS — I480 Paroxysmal atrial fibrillation: Secondary | ICD-10-CM

## 2019-01-10 DIAGNOSIS — G4700 Insomnia, unspecified: Secondary | ICD-10-CM | POA: Diagnosis not present

## 2019-01-10 DIAGNOSIS — E785 Hyperlipidemia, unspecified: Secondary | ICD-10-CM

## 2019-01-10 LAB — HEPATIC FUNCTION PANEL
ALT: 14 U/L (ref 0–35)
AST: 17 U/L (ref 0–37)
Albumin: 4.1 g/dL (ref 3.5–5.2)
Alkaline Phosphatase: 97 U/L (ref 39–117)
Bilirubin, Direct: 0.1 mg/dL (ref 0.0–0.3)
Total Bilirubin: 0.5 mg/dL (ref 0.2–1.2)
Total Protein: 6.7 g/dL (ref 6.0–8.3)

## 2019-01-10 LAB — LIPID PANEL
Cholesterol: 151 mg/dL (ref 0–200)
HDL: 62.1 mg/dL (ref >39.00)
LDL Cholesterol: 68 mg/dL (ref 0–99)
NonHDL: 88.65
Total CHOL/HDL Ratio: 2
Triglycerides: 102 mg/dL (ref 0.0–149.0)
VLDL: 20.4 mg/dL (ref 0.0–40.0)

## 2019-01-10 LAB — BASIC METABOLIC PANEL
BUN: 11 mg/dL (ref 6–23)
CO2: 28 mEq/L (ref 19–32)
Calcium: 9 mg/dL (ref 8.4–10.5)
Chloride: 101 mEq/L (ref 96–112)
Creatinine, Ser: 0.63 mg/dL (ref 0.40–1.20)
GFR: 92.8 mL/min (ref >60.00)
GLUCOSE: 90 mg/dL (ref 70–99)
POTASSIUM: 4.2 meq/L (ref 3.5–5.1)
Sodium: 139 mEq/L (ref 135–145)

## 2019-01-10 LAB — TSH: TSH: 2.53 u[IU]/mL (ref 0.35–4.50)

## 2019-01-10 NOTE — Assessment & Plan Note (Signed)
Resolved

## 2019-01-10 NOTE — Assessment & Plan Note (Signed)
Xarelto

## 2019-01-10 NOTE — Assessment & Plan Note (Signed)
Valerian root 

## 2019-01-10 NOTE — Progress Notes (Signed)
Subjective:  Patient ID: Vicki [name], female    DOB: 11-Jun-1946  Age: 73 y.o. MRN: [id number]  CC: No chief complaint on file.   HPI Korrina Zern presents for A fib,GERD, dyslipidemia f/u  Outpatient Medications Prior to Visit  Medication Sig Dispense Refill  . cholecalciferol (VITAMIN D) 1000 UNITS tablet Take 1,000 Units by mouth 2 (two) times daily.    . famotidine (PEPCID) 20 MG tablet Take 1 tablet (20 mg total) by mouth daily. 90 tablet 3  . fluticasone (FLONASE) 50 MCG/ACT nasal spray Place 2 sprays into both nostrils daily. 16 g 6  . hydroxypropyl methylcellulose / hypromellose (ISOPTO TEARS / GONIOVISC) 2.5 % ophthalmic solution Place 1 drop into both eyes as needed for dry eyes.    Marland Kitchen loratadine (CLARITIN) 10 MG tablet Take 10 mg by mouth daily.    . Melatonin 3 MG TABS Take 3 mg by mouth.    . Omega-3 Fatty Acids (FISH OIL) 1000 MG CAPS Take by mouth.    . rivaroxaban (XARELTO) 20 MG TABS tablet Take 1 tablet (20 mg total) by mouth daily. 90 tablet 1  . simvastatin (ZOCOR) 20 MG tablet TAKE 1 TABLET BY MOUTH AT BEDTIME 90 tablet 3  . triamcinolone ointment (KENALOG) 0.5 % APPLY OINTMENT TOPICALLY TWICE DAILY TO HAND RASH 45 g 0  . trimethoprim-polymyxin b (POLYTRIM) ophthalmic solution Place 1 drop into the left eye See admin instructions. After eye injection, every 5 weeks, 1 drop 4 times daily in Left eye for 2 days    . benzonatate (TESSALON) 100 MG capsule Take 1 capsule (100 mg total) by mouth 3 (three) times daily as needed. 20 capsule 0  . cefdinir (OMNICEF) 300 MG capsule Take 1 capsule (300 mg total) by mouth 2 (two) times daily. 20 capsule 0  . sodium chloride (OCEAN) 0.65 % SOLN nasal spray Place 1 spray into both nostrils at bedtime as needed for congestion.     No facility-administered medications prior to visit.     ROS: Review of Systems  Constitutional: Negative for activity change, appetite change, chills, fatigue and unexpected weight change.    HENT: Negative for congestion, mouth sores and sinus pressure.   Eyes: Negative for visual disturbance.  Respiratory: Negative for cough and chest tightness.   Gastrointestinal: Negative for abdominal pain and nausea.  Genitourinary: Negative for difficulty urinating, frequency and vaginal pain.  Musculoskeletal: Negative for back pain and gait problem.  Skin: Negative for pallor and rash.  Neurological: Negative for dizziness, tremors, weakness, numbness and headaches.  Psychiatric/Behavioral: Negative for confusion and sleep disturbance.    Objective:  BP 124/76 (BP Location: Left Arm, Patient Position: Sitting, Cuff Size: Normal)   Pulse 61   Temp 98 F (36.7 C) (Oral)   Ht 5\' 3"  (1.6 m)   Wt 120 lb (54.4 kg)   SpO2 95%   BMI 21.26 kg/m   BP Readings from Last 3 Encounters:  01/10/19 124/76  10/30/18 116/72  10/13/18 130/70    Wt Readings from Last 3 Encounters:  01/10/19 120 lb (54.4 kg)  10/30/18 120 lb (54.4 kg)  10/13/18 120 lb 0.6 oz (54.4 kg)    Physical Exam Constitutional:      General: She is not in acute distress.    Appearance: She is well-developed.  HENT:     Head: Normocephalic.     Right Ear: External ear normal.     Left Ear: External ear normal.  Nose: Nose normal.  Eyes:     General:        Right eye: No discharge.        Left eye: No discharge.     Conjunctiva/sclera: Conjunctivae normal.     Pupils: Pupils are equal, round, and reactive to light.  Neck:     Musculoskeletal: Normal range of motion and neck supple.     Thyroid: No thyromegaly.     Vascular: No JVD.     Trachea: No tracheal deviation.  Cardiovascular:     Rate and Rhythm: Normal rate and regular rhythm.     Heart sounds: Normal heart sounds.  Pulmonary:     Effort: No respiratory distress.     Breath sounds: No stridor. No wheezing.  Abdominal:     General: Bowel sounds are normal. There is no distension.     Palpations: Abdomen is soft. There is no mass.      Tenderness: There is no abdominal tenderness. There is no guarding or rebound.  Musculoskeletal:        General: No tenderness.  Lymphadenopathy:     Cervical: No cervical adenopathy.  Skin:    Findings: No erythema or rash.  Neurological:     Cranial Nerves: No cranial nerve deficit.     Motor: No abnormal muscle tone.     Coordination: Coordination normal.     Deep Tendon Reflexes: Reflexes normal.  Psychiatric:        Behavior: Behavior normal.        Thought Content: Thought content normal.        Judgment: Judgment normal.     Lab Results  Component Value Date   WBC 5.0 07/10/2018   HGB 12.7 07/10/2018   HCT 37.2 07/10/2018   PLT 288.0 07/10/2018   GLUCOSE 103 (H) 07/10/2018   CHOL 151 07/10/2018   TRIG 119.0 07/10/2018   HDL 60.90 07/10/2018   LDLCALC 66 07/10/2018   ALT 12 07/10/2018   AST 17 07/10/2018   NA 137 07/10/2018   K 4.3 07/10/2018   CL 100 07/10/2018   CREATININE 0.65 07/10/2018   BUN 12 07/10/2018   CO2 30 07/10/2018   TSH 2.53 07/10/2018   INR 1.41 08/07/2015    Dg Chest 2 View  Result Date: 10/30/2018 CLINICAL DATA:  Cough with right lower posterior chest pain and tenderness to palpation. EXAM: CHEST - 2 VIEW COMPARISON:  08/07/2015 chest radiograph. FINDINGS: Stable cardiomediastinal silhouette with normal heart size. No pneumothorax. No pleural effusion. Lungs appear clear, with no acute consolidative airspace disease and no pulmonary edema. IMPRESSION: No active cardiopulmonary disease. Electronically Signed   By: Ilona Sorrel M.D.   On: 10/30/2018 16:52   Dg Ribs Unilateral Right  Result Date: 10/30/2018 CLINICAL DATA:  Right lower chest wall pain and tenderness to palpation after coughing for 1 month. No reported injury. EXAM: RIGHT RIBS - 2 VIEW COMPARISON:  Concurrent chest radiographs from today. FINDINGS: The area of symptomatic concern as indicated by the patient in the right lower chest wall was denoted with a metallic skin BB by the  technologist. No fracture or suspicious focal osseous lesion is seen in the right ribs. IMPRESSION: No fracture or focal osseous lesion detected in the right ribs. Electronically Signed   By: Ilona Sorrel M.D.   On: 10/30/2018 17:05    Assessment & Plan:   There are no diagnoses linked to this encounter.   No orders of the defined types were placed  in this encounter.    Follow-up: No follow-ups on file.  Walker Kehr, MD

## 2019-01-10 NOTE — Assessment & Plan Note (Signed)
Simvastatin 

## 2019-01-10 NOTE — Patient Instructions (Addendum)
Medical screening examination/treatment/procedure(s) were performed by non-physician practitioner and as supervising physician I was immediately available for consultation/collaboration. I agree with above. Lew Dawes, MD  The average sedentary [place] accumulates approximately 3,500 - 5,000 steps each day. Research suggests that, in general, the average [place] American should increase their daily walking activity to approximately 7,000 - 10,000 per day.

## 2019-01-15 ENCOUNTER — Ambulatory Visit
Admission: RE | Admit: 2019-01-15 | Discharge: 2019-01-15 | Disposition: A | Discharge status: Home / Self Care | Payer: PPO | Source: Ambulatory Visit | Attending: Internal Medicine | Admitting: Internal Medicine

## 2019-01-15 DIAGNOSIS — Z1231 Encounter for screening mammogram for malignant neoplasm of breast: Secondary | ICD-10-CM | POA: Diagnosis not present

## 2019-01-16 NOTE — Progress Notes (Addendum)
Subjective:   Vicki [name] is a 73 y.o. female who presents for Medicare Annual (Subsequent) preventive examination.  Review of Systems:  No ROS.  Medicare Wellness Visit. Additional risk factors are reflected in the social history.  Cardiac Risk Factors include: advanced age (>89men, >10 women);dyslipidemia Sleep patterns: feels rested on waking, gets up 2 times nightly to void and sleeps 7-8 hours nightly.    Home Safety/Smoke Alarms: Feels safe in home. Smoke alarms in place.  Living environment; residence and Firearm Safety: 1-story house/ trailer. Lives with husband, no needs for DME, good support system Seat Belt Safety/Bike Helmet: Wears seat belt.     Objective:     Vitals: BP 118/68   Pulse 61   Resp 18   Ht 5\' 3"  (1.6 m)   Wt 123 lb (55.8 kg)   SpO2 99%   BMI 21.79 kg/m   Body mass index is 21.79 kg/m.  Advanced Directives 01/17/2019 01/16/2018 05/07/2016 09/03/2015 08/14/2015  Does Patient Have a Medical Advance Directive? No No No Yes Yes  Type of Advance Directive - - - Living will Living will  Does patient want to make changes to medical advance directive? - Yes (ED - Information included in AVS) - No - Patient declined -  Copy of [place] in Chart? - - - No - copy requested No - copy requested  Would patient like information on creating a medical advance directive? No - Patient declined - No - patient declined information - -    Tobacco Social History   Tobacco Use  Smoking Status Former Smoker  . Types: Cigarettes  . Last attempt to quit: 12/06/1976  . Years since quitting: 42.1  Smokeless Tobacco Never Used  Tobacco Comment   smoked approx 10 years     Counseling given: Not Answered Comment: smoked approx 10 years  Past Medical History:  Diagnosis Date  . Adenocarcinoma, breast ([place])   . Allergic rhinitis   . Anxiety   . Aortic insufficiency   . Breast cancer [facility]) 2008   Left Breast Cancer  . Contact dermatitis   .  Diverticulosis of colon   . DJD (degenerative joint disease)   . GERD (gastroesophageal reflux disease)   . Hx of colonic polyps   . Hyperlipidemia   . Osteopenia   . Paroxysmal atrial fibrillation ([facility])    A. fib is now persistent.  . Personal history of radiation therapy 2008   Left Breast Cancer  . Visit for monitoring Tikosyn therapy 09/06/2015   Past Surgical History:  Procedure Laterality Date  . ABDOMINAL HYSTERECTOMY  1984   with 1 ovary removal  . BREAST LUMPECTOMY Left 08/2007    with node bx  . ELECTROPHYSIOLOGIC STUDY N/A 05/07/2016   Procedure: Atrial Fibrillation Ablation;  Surgeon: Will Meredith Leeds, MD;  Location: [facility];  Service: Cardiovascular;  Laterality: N/A;  . macular degeneration Bilateral    Family History  Problem Relation Age of Onset  . Heart disease Father 69  . Heart attack Father   . Other Mother 47       AVR  . Heart disease Mother   . Clotting disorder Mother 26  . Breast cancer Sister   . Lymphoma Sister 22  . Hypertension Sister   . Healthy Brother   . Other Maternal Grandmother    Social History   Socioeconomic History  . Marital status: Married    Spouse name: Not on file  . Number  of children: 1  . Years of education: Not on file  . Highest education level: Not on file  Occupational History  . Occupation: ACCOUNTING    Employer: [employer]  . Financial resource strain: Not hard at all  . Food insecurity:    Worry: Never true    Inability: Never true  . Transportation needs:    Medical: No    Non-medical: No  Tobacco Use  . Smoking status: Former Smoker    Types: Cigarettes    Last attempt to quit: 12/06/1976    Years since quitting: 42.1  . Smokeless tobacco: Never Used  . Tobacco comment: smoked approx 10 years  Substance and Sexual Activity  . Alcohol use: No    Alcohol/week: 0.0 standard drinks  . Drug use: No  . Sexual activity: Not Currently  Lifestyle  . Physical activity:     Days per week: 5 days    Minutes per session: 40 min  . Stress: Not at all  Relationships  . Social connections:    Talks on phone: More than three times a week    Gets together: More than three times a week    Attends religious service: More than 4 times per year    Active member of club or organization: Yes    Attends meetings of clubs or organizations: More than 4 times per year    Relationship status: Married  Other Topics Concern  . Not on file  Social History Narrative  . Not on file    Outpatient Encounter Medications as of 01/17/2019  Medication Sig  . acetaminophen (TYLENOL) 325 MG tablet Take 500 mg by mouth every 6 (six) hours as needed.  . cholecalciferol (VITAMIN D) 1000 UNITS tablet Take 1,000 Units by mouth 2 (two) times daily.  . famotidine (PEPCID) 20 MG tablet Take 1 tablet (20 mg total) by mouth daily.  . hydroxypropyl methylcellulose / hypromellose (ISOPTO TEARS / GONIOVISC) 2.5 % ophthalmic solution Place 1 drop into both eyes as needed for dry eyes.  Marland Kitchen loratadine (CLARITIN) 10 MG tablet Take 10 mg by mouth daily.  . Melatonin 3 MG TABS Take 3 mg by mouth as needed.   . Multiple Vitamins-Minerals (ICAPS) TABS Take 2 tablets by mouth 2 (two) times daily.  . Omega-3 Fatty Acids (FISH OIL) 1000 MG CAPS Take by mouth.  . rivaroxaban (XARELTO) 20 MG TABS tablet Take 1 tablet (20 mg total) by mouth daily.  . simvastatin (ZOCOR) 20 MG tablet TAKE 1 TABLET BY MOUTH AT BEDTIME  . triamcinolone ointment (KENALOG) 0.5 % APPLY OINTMENT TOPICALLY TWICE DAILY TO HAND RASH  . trimethoprim-polymyxin b (POLYTRIM) ophthalmic solution Place 1 drop into the left eye See admin instructions. After eye injection, every 5 weeks, 1 drop 4 times daily in Left eye for 2 days  . [DISCONTINUED] fluticasone (FLONASE) 50 MCG/ACT nasal spray Place 2 sprays into both nostrils daily. (Patient not taking: Reported on 01/17/2019)   No facility-administered encounter medications on file as of  01/17/2019.     Activities of Daily Living In your present state of health, do you have any difficulty performing the following activities: 01/17/2019  Hearing? N  Vision? Y  Difficulty concentrating or making decisions? N  Walking or climbing stairs? N  Dressing or bathing? N  Doing errands, shopping? N  Preparing Food and eating ? N  Using the Toilet? N  In the past six months, have you accidently leaked urine? N  Do  you have problems with loss of bowel control? N  Managing your Medications? N  Managing your Finances? N  Housekeeping or managing your Housekeeping? N  Some recent data might be hidden    Patient Care Team: Plotnikov, Evie Lacks, MD as PCP - General (Internal Medicine) Lorretta Harp, MD as Consulting Physician (Cardiology)    Assessment:   This is a routine wellness examination for Ceanna. Physical assessment deferred to PCP.  Exercise Activities and Dietary recommendations Current Exercise Habits: Home exercise routine, Type of exercise: walking, Time (Minutes): 35, Frequency (Times/Week): 6, Weekly Exercise (Minutes/Week): 210, Intensity: Mild, Exercise limited by: None identified  Diet (meal preparation, eat out, water intake, caffeinated beverages, dairy products, fruits and vegetables): in general, a "healthy" diet  , well balanced. eats a variety of fruits and vegetables daily, limits salt, fat/cholesterol, sugar,carbohydrates,caffeine.   Encouraged patient to increase daily water and healthy fluid intake.  Goals    . Exercise 150 minutes per week (moderate activity)     [facility] walk [place] mile in the neighborhood 2 to 3 days    . Patient Stated     Increase the amount of water I drink. I will start by drinking 2 glasses of water daily. Continue to walk, do yard work, enjoy life, family, and travel.    . Patient Stated     Maintain current health status and stay as independent as possible. Continue to stay involve with my family.        Fall  Risk Fall Risk  01/17/2019 01/16/2018 07/08/2017 07/07/2016 07/04/2015  Falls in the past year? 0 Yes Yes No No  Number falls in past yr: - - 1 - -  Injury with Fall? - Yes Yes - -  Risk for fall due to : Impaired balance/gait;Impaired mobility - - - -    Depression Screen PHQ 2/9 Scores 01/17/2019 01/16/2018 07/08/2017 07/07/2016  PHQ - 2 Score 0 0 0 0  PHQ- 9 Score - 0 - -     Cognitive Function MMSE - Mini Mental State Exam 01/16/2018  Orientation to time 5  Orientation to Place 5  Registration 3  Attention/ Calculation 5  Recall 3  Language- name 2 objects 2  Language- repeat 1  Language- follow 3 step command 3  Language- read & follow direction 1  Write a sentence 1  Copy design 1  Total score 30       Ad8 score reviewed for issues:  Issues making decisions: no  Less interest in hobbies / activities: no  Repeats questions, stories (family complaining): no  Trouble using ordinary gadgets (microwave, computer, phone):no  Forgets the month or year: no  Mismanaging finances: no  Remembering appts: no  Daily problems with thinking and/or memory: no Ad8 score is= 0  Immunization History  Administered Date(s) Administered  . Influenza Split 09/17/2011, 09/21/2012  . Influenza Whole 09/07/2010, 09/19/2016  . Influenza, High Dose Seasonal PF 08/30/2017, 08/30/2018  . Influenza-Unspecified 09/05/2014, 09/15/2015  . Pneumococcal Conjugate-13 07/04/2015  . Pneumococcal Polysaccharide-23 03/19/2014  . Td 07/04/2015  . Zoster 09/28/2016   Screening Tests Health Maintenance  Topic Date Due  . COLONOSCOPY  01/25/2018  . MAMMOGRAM  01/15/2021  . TETANUS/TDAP  07/03/2025  . INFLUENZA VACCINE  Completed  . DEXA SCAN  Completed  . Hepatitis C Screening  Completed  . PNA vac Low Risk Adult  Completed      Plan:    Reviewed health maintenance screenings with patient today  and relevant education, vaccines, and/or referrals were provided.  Education was provided regarding  colonoscopy vs. cologuard and patient will call nurse if she decides to do have either of the two.   Continue doing brain stimulating activities (puzzles, reading, adult coloring books, staying active) to keep memory sharp.   Continue to eat heart healthy diet (full of fruits, vegetables, whole grains, lean protein, water--limit salt, fat, and sugar intake) and increase physical activity as tolerated.  I have personally reviewed and noted the following in the patient's chart:   . Medical and social history . Use of alcohol, tobacco or illicit drugs  . Current medications and supplements . Functional ability and status . Nutritional status . Physical activity . Advanced directives . List of other physicians . Vitals . Screenings to include cognitive, depression, and falls . Referrals and appointments  In addition, I have reviewed and discussed with patient certain preventive protocols, quality metrics, and best practice recommendations. A written personalized care plan for preventive services as well as general preventive health recommendations were provided to patient.     Michiel Cowboy, RN  01/17/2019  Medical screening examination/treatment/procedure(s) were performed by non-physician practitioner and as supervising physician I was immediately available for consultation/collaboration. I agree with above. Lew Dawes, MD

## 2019-01-17 ENCOUNTER — Ambulatory Visit (INDEPENDENT_AMBULATORY_CARE_PROVIDER_SITE_OTHER): Payer: PPO | Admitting: *Deleted

## 2019-01-17 VITALS — BP 118/68 | HR 61 | Resp 18 | Ht 63.0 in | Wt 123.0 lb

## 2019-01-17 DIAGNOSIS — Z0000 Encounter for general adult medical examination without abnormal findings: Secondary | ICD-10-CM

## 2019-01-17 NOTE — Patient Instructions (Signed)
Continue doing brain stimulating activities (puzzles, reading, adult coloring books, staying active) to keep memory sharp.   Continue to eat heart healthy diet (full of fruits, vegetables, whole grains, lean protein, water--limit salt, fat, and sugar intake) and increase physical activity as tolerated.   [name] , Thank you for taking time to come for your Medicare Wellness Visit. I appreciate your ongoing commitment to your health goals. Please review the following plan we discussed and let me know if I can assist you in the future.   These are the goals we discussed: Goals    . Exercise 150 minutes per week (moderate activity)     [place] [facility] walk [place] mile in the neighborhood 2 to 3 days    . Patient Stated     Increase the amount of water I drink. I will start by drinking 2 glasses of water daily. Continue to walk, do yard work, enjoy life, family, and travel.    . Patient Stated     Maintain current health status and stay as independent as possible. Continue to stay involve with my family.        This is a list of the screening recommended for you and due dates:  Health Maintenance  Topic Date Due  . Colon Cancer Screening  01/25/2018  . Mammogram  01/15/2021  . Tetanus Vaccine  07/03/2025  . Flu Shot  Completed  . DEXA scan (bone density measurement)  Completed  .  Hepatitis C: One time screening is recommended by Center for Disease Control  (CDC) for  adults born from [birth year] through 1965.   Completed  . Pneumonia vaccines  Completed   Health Maintenance, Female Adopting a healthy lifestyle and getting preventive care can go a long way to promote health and wellness. Talk with your health care provider about what schedule of regular examinations is right for you. This is a good chance for you to check in with your provider about disease prevention and staying healthy. In between checkups, there are plenty of things you can do on your own. Experts have done a lot of research  about which lifestyle changes and preventive measures are most likely to keep you healthy. Ask your health care provider for more information. Weight and diet Eat a healthy diet  Be sure to include plenty of vegetables, fruits, low-fat dairy products, and lean protein.  Do not eat a lot of foods high in solid fats, added sugars, or salt.  Get regular exercise. This is one of the most important things you can do for your health. ? Most adults should exercise for at least 150 minutes each week. The exercise should increase your heart rate and make you sweat (moderate-intensity exercise). ? Most adults should also do strengthening exercises at least twice a week. This is in addition to the moderate-intensity exercise. Maintain a healthy weight  Body mass index (BMI) is a measurement that can be used to identify possible weight problems. It estimates body fat based on height and weight. Your health care provider can help determine your BMI and help you achieve or maintain a healthy weight.  For females 66 years of age and older: ? A BMI below 18.5 is considered underweight. ? A BMI of 18.5 to 24.9 is normal. ? A BMI of 25 to 29.9 is considered overweight. ? A BMI of 30 and above is considered obese. Watch levels of cholesterol and blood lipids  You should start having your blood tested for lipids  and cholesterol at 73 years of age, then have this test every 5 years.  You may need to have your cholesterol levels checked more often if: ? Your lipid or cholesterol levels are high. ? You are older than 73 years of age. ? You are at high risk for heart disease. Cancer screening Lung Cancer  Lung cancer screening is recommended for adults 61-60 years old who are at high risk for lung cancer because of a history of smoking.  A yearly low-dose CT scan of the lungs is recommended for people who: ? Currently smoke. ? Have quit within the past 15 years. ? Have at least a 30-pack-year history of  smoking. A pack year is smoking an average of one pack of cigarettes a day for 1 year.  Yearly screening should continue until it has been 15 years since you quit.  Yearly screening should stop if you develop a health problem that would prevent you from having lung cancer treatment. Breast Cancer  Practice breast self-awareness. This means understanding how your breasts normally appear and feel.  It also means doing regular breast self-exams. Let your health care provider know about any changes, no matter how small.  If you are in your 20s or 30s, you should have a clinical breast exam (CBE) by a health care provider every 1-3 years as part of a regular health exam.  If you are 70 or older, have a CBE every year. Also consider having a breast X-ray (mammogram) every year.  If you have a family history of breast cancer, talk to your health care provider about genetic screening.  If you are at high risk for breast cancer, talk to your health care provider about having an MRI and a mammogram every year.  Breast cancer gene (BRCA) assessment is recommended for women who have family members with BRCA-related cancers. BRCA-related cancers include: ? Breast. ? Ovarian. ? Tubal. ? Peritoneal cancers.  Results of the assessment will determine the need for genetic counseling and BRCA1 and BRCA2 testing. Cervical Cancer Your health care provider may recommend that you be screened regularly for cancer of the pelvic organs (ovaries, uterus, and vagina). This screening involves a pelvic examination, including checking for microscopic changes to the surface of your cervix (Pap test). You may be encouraged to have this screening done every 3 years, beginning at age 31.  For women ages 47-65, health care providers may recommend pelvic exams and Pap testing every 3 years, or they may recommend the Pap and pelvic exam, combined with testing for human papilloma virus (HPV), every 5 years. Some types of HPV  increase your risk of cervical cancer. Testing for HPV may also be done on women of any age with unclear Pap test results.  Other health care providers may not recommend any screening for nonpregnant women who are considered low risk for pelvic cancer and who do not have symptoms. Ask your health care provider if a screening pelvic exam is right for you.  If you have had past treatment for cervical cancer or a condition that could lead to cancer, you need Pap tests and screening for cancer for at least 20 years after your treatment. If Pap tests have been discontinued, your risk factors (such as having a new sexual partner) need to be reassessed to determine if screening should resume. Some women have medical problems that increase the chance of getting cervical cancer. In these cases, your health care provider may recommend more frequent screening and Pap  tests. Colorectal Cancer  This type of cancer can be detected and often prevented.  Routine colorectal cancer screening usually begins at 73 years of age and continues through 73 years of age.  Your health care provider may recommend screening at an earlier age if you have risk factors for colon cancer.  Your health care provider may also recommend using home test kits to check for hidden blood in the stool.  A small camera at the end of a tube can be used to examine your colon directly (sigmoidoscopy or colonoscopy). This is done to check for the earliest forms of colorectal cancer.  Routine screening usually begins at age 90.  Direct examination of the colon should be repeated every 5-10 years through 72 years of age. However, you may need to be screened more often if early forms of precancerous polyps or small growths are found. Skin Cancer  Check your skin from head to toe regularly.  Tell your health care provider about any new moles or changes in moles, especially if there is a change in a mole's shape or color.  Also tell your  health care provider if you have a mole that is larger than the size of a pencil eraser.  Always use sunscreen. Apply sunscreen liberally and repeatedly throughout the day.  Protect yourself by wearing long sleeves, pants, a wide-brimmed hat, and sunglasses whenever you are outside. Heart disease, diabetes, and high blood pressure  High blood pressure causes heart disease and increases the risk of stroke. High blood pressure is more likely to develop in: ? People who have blood pressure in the high end of the normal range (130-139/85-89 mm Hg). ? People who are overweight or obese. ? People who are African American.  If you are 49-35 years of age, have your blood pressure checked every 3-5 years. If you are 66 years of age or older, have your blood pressure checked every year. You should have your blood pressure measured twice-once when you are at a hospital or clinic, and once when you are not at a hospital or clinic. Record the average of the two measurements. To check your blood pressure when you are not at a hospital or clinic, you can use: ? An automated blood pressure machine at a pharmacy. ? A home blood pressure monitor.  If you are between 32 years and 84 years old, ask your health care provider if you should take aspirin to prevent strokes.  Have regular diabetes screenings. This involves taking a blood sample to check your fasting blood sugar level. ? If you are at a normal weight and have a low risk for diabetes, have this test once every three years after 73 years of age. ? If you are overweight and have a high risk for diabetes, consider being tested at a younger age or more often. Preventing infection Hepatitis B  If you have a higher risk for hepatitis B, you should be screened for this virus. You are considered at high risk for hepatitis B if: ? You were born in a country where hepatitis B is common. Ask your health care provider which countries are considered high  risk. ? Your parents were born in a high-risk country, and you have not been immunized against hepatitis B (hepatitis B vaccine). ? You have HIV or AIDS. ? You use needles to inject street drugs. ? You live with someone who has hepatitis B. ? You have had sex with someone who has hepatitis B. ? You  get hemodialysis treatment. ? You take certain medicines for conditions, including cancer, organ transplantation, and autoimmune conditions. Hepatitis C  Blood testing is recommended for: ? Everyone born from [birth year] through 1965. ? Anyone with known risk factors for hepatitis C. Sexually transmitted infections (STIs)  You should be screened for sexually transmitted infections (STIs) including gonorrhea and chlamydia if: ? You are sexually active and are younger than 73 years of age. ? You are older than 73 years of age and your health care provider tells you that you are at risk for this type of infection. ? Your sexual activity has changed since you were last screened and you are at an increased risk for chlamydia or gonorrhea. Ask your health care provider if you are at risk.  If you do not have HIV, but are at risk, it may be recommended that you take a prescription medicine daily to prevent HIV infection. This is called pre-exposure prophylaxis (PrEP). You are considered at risk if: ? You are sexually active and do not regularly use condoms or know the HIV status of your partner(s). ? You take drugs by injection. ? You are sexually active with a partner who has HIV. Talk with your health care provider about whether you are at high risk of being infected with HIV. If you choose to begin PrEP, you should first be tested for HIV. You should then be tested every 3 months for as long as you are taking PrEP. Pregnancy  If you are premenopausal and you may become pregnant, ask your health care provider about preconception counseling.  If you may become pregnant, take 400 to 800 micrograms (mcg) of  folic acid every day.  If you want to prevent pregnancy, talk to your health care provider about birth control (contraception). Osteoporosis and menopause  Osteoporosis is a disease in which the bones lose minerals and strength with aging. This can result in serious bone fractures. Your risk for osteoporosis can be identified using a bone density scan.  If you are 52 years of age or older, or if you are at risk for osteoporosis and fractures, ask your health care provider if you should be screened.  Ask your health care provider whether you should take a calcium or vitamin D supplement to lower your risk for osteoporosis.  Menopause may have certain physical symptoms and risks.  Hormone replacement therapy may reduce some of these symptoms and risks. Talk to your health care provider about whether hormone replacement therapy is right for you. Follow these instructions at home:  Schedule regular health, dental, and eye exams.  Stay current with your immunizations.  Do not use any tobacco products including cigarettes, chewing tobacco, or electronic cigarettes.  If you are pregnant, do not drink alcohol.  If you are breastfeeding, limit how much and how often you drink alcohol.  Limit alcohol intake to no more than 1 drink per day for nonpregnant women. One drink equals 12 ounces of beer, 5 ounces of Yuli Lanigan, or 1 ounces of hard liquor.  Do not use street drugs.  Do not share needles.  Ask your health care provider for help if you need support or information about quitting drugs.  Tell your health care provider if you often feel depressed.  Tell your health care provider if you have ever been abused or do not feel safe at home. This information is not intended to replace advice given to you by your health care provider. Make sure you discuss any questions you  have with your health care provider. Document Released: 06/07/2011 Document Revised: 04/29/2016 Document Reviewed:  08/26/2015 Elsevier Interactive Patient Education  2019 [employer].

## 2019-01-24 ENCOUNTER — Encounter (INDEPENDENT_AMBULATORY_CARE_PROVIDER_SITE_OTHER): Payer: PPO | Admitting: Ophthalmology

## 2019-01-24 DIAGNOSIS — H353221 Exudative age-related macular degeneration, left eye, with active choroidal neovascularization: Secondary | ICD-10-CM | POA: Diagnosis not present

## 2019-01-24 DIAGNOSIS — H2513 Age-related nuclear cataract, bilateral: Secondary | ICD-10-CM

## 2019-01-24 DIAGNOSIS — H43813 Vitreous degeneration, bilateral: Secondary | ICD-10-CM

## 2019-01-24 DIAGNOSIS — I10 Essential (primary) hypertension: Secondary | ICD-10-CM | POA: Diagnosis not present

## 2019-01-24 DIAGNOSIS — H35033 Hypertensive retinopathy, bilateral: Secondary | ICD-10-CM

## 2019-01-24 DIAGNOSIS — H353112 Nonexudative age-related macular degeneration, right eye, intermediate dry stage: Secondary | ICD-10-CM | POA: Diagnosis not present

## 2019-02-05 DIAGNOSIS — D493 Neoplasm of unspecified behavior of breast: Secondary | ICD-10-CM | POA: Insufficient documentation

## 2019-02-05 DIAGNOSIS — Z853 Personal history of malignant neoplasm of breast: Secondary | ICD-10-CM | POA: Insufficient documentation

## 2019-02-05 DIAGNOSIS — C801 Malignant (primary) neoplasm, unspecified: Secondary | ICD-10-CM | POA: Insufficient documentation

## 2019-02-05 DIAGNOSIS — N951 Menopausal and female climacteric states: Secondary | ICD-10-CM | POA: Insufficient documentation

## 2019-02-06 ENCOUNTER — Encounter: Payer: Self-pay | Admitting: Cardiology

## 2019-02-06 ENCOUNTER — Ambulatory Visit (INDEPENDENT_AMBULATORY_CARE_PROVIDER_SITE_OTHER): Payer: PPO | Admitting: Cardiology

## 2019-02-06 VITALS — BP 128/68 | HR 68 | Ht 63.0 in | Wt 121.0 lb

## 2019-02-06 DIAGNOSIS — I4819 Other persistent atrial fibrillation: Secondary | ICD-10-CM | POA: Diagnosis not present

## 2019-02-06 NOTE — Progress Notes (Signed)
Electrophysiology Office Note   Date:  02/06/2019   ID:  Vicki [name], Vicki [name] [birth year], MRN [id number]  PCP:  Cassandria Anger, MD  Cardiologist:  Quay Burow Primary Electrophysiologist: Constance Haw, MD    No chief complaint on file.    History of Present Illness: Vicki [name] is a 73 y.o. female who presents today for electrophysiology evaluation.   She has a history of persistent atrial fibrillation, hyperlipidemia, and aortic insufficiency. She is on Xarelto for [facility] of 2.  AF ablation 05/07/16.   Today, denies symptoms of palpitations, chest pain, shortness of breath, orthopnea, PND, lower extremity edema, claudication, dizziness, presyncope, syncope, bleeding, or neurologic sequela. The patient is tolerating medications without difficulties.  Overall she is doing well.  She has had no further chest pain or shortness of breath.  She is able to do all of her daily activities.  She has noted no further episodes of atrial fibrillation.   Past Medical History:  Diagnosis Date  . Adenocarcinoma, breast ([place])   . Allergic rhinitis   . Anxiety   . Aortic insufficiency   . Breast cancer [facility]) 2008   Left Breast Cancer  . Contact dermatitis   . Diverticulosis of colon   . DJD (degenerative joint disease)   . GERD (gastroesophageal reflux disease)   . Hx of colonic polyps   . Hyperlipidemia   . Osteopenia   . Paroxysmal atrial fibrillation ([facility])    A. fib is now persistent.  . Personal history of radiation therapy 2008   Left Breast Cancer  . Visit for monitoring Tikosyn therapy 09/06/2015   Past Surgical History:  Procedure Laterality Date  . ABDOMINAL HYSTERECTOMY  1984   with 1 ovary removal  . BREAST LUMPECTOMY Left 08/2007    with node bx  . ELECTROPHYSIOLOGIC STUDY N/A 05/07/2016   Procedure: Atrial Fibrillation Ablation;  Surgeon: Machel Violante Meredith Leeds, MD;  Location: [place] [facility];  Service: Cardiovascular;  Laterality: N/A;  . macular  degeneration Bilateral      Current Outpatient Medications  Medication Sig Dispense Refill  . acetaminophen (TYLENOL) 325 MG tablet Take 500 mg by mouth every 6 (six) hours as needed.    . cholecalciferol (VITAMIN D) 1000 UNITS tablet Take 1,000 Units by mouth 2 (two) times daily.    . famotidine (PEPCID) 20 MG tablet Take 1 tablet (20 mg total) by mouth daily. 90 tablet 3  . hydroxypropyl methylcellulose / hypromellose (ISOPTO TEARS / GONIOVISC) 2.5 % ophthalmic solution Place 1 drop into both eyes as needed for dry eyes.    Marland Kitchen loratadine (CLARITIN) 10 MG tablet Take 10 mg by mouth daily.    . Melatonin 3 MG TABS Take 3 mg by mouth as needed.     . Multiple Vitamins-Minerals (ICAPS) TABS Take 2 tablets by mouth 2 (two) times daily.    . Omega-3 Fatty Acids (FISH OIL) 1000 MG CAPS Take by mouth.    . rivaroxaban (XARELTO) 20 MG TABS tablet Take 1 tablet (20 mg total) by mouth daily. 90 tablet 1  . simvastatin (ZOCOR) 20 MG tablet TAKE 1 TABLET BY MOUTH AT BEDTIME 90 tablet 3  . triamcinolone ointment (KENALOG) 0.5 % APPLY OINTMENT TOPICALLY TWICE DAILY TO HAND RASH 45 g 0  . trimethoprim-polymyxin b (POLYTRIM) ophthalmic solution Place 1 drop into the left eye See admin instructions. After eye injection, every 5 weeks, 1 drop 4 times daily in Left eye for 2 days  No current facility-administered medications for this visit.     Allergies:   Codeine   Social History:  The patient  reports that she quit smoking about 42 years ago. Her smoking use included cigarettes. She has never used smokeless tobacco. She reports that she does not drink alcohol or use drugs.   Family History:  The patient's family history includes Breast cancer in her sister; Clotting disorder (age of onset: 93) in her mother; Healthy in her brother; Heart attack in her father; Heart disease in her mother; Heart disease (age of onset: 75) in her father; Hypertension in her sister; Lymphoma (age of onset: 28) in her  sister; Other in her maternal grandmother; Other (age of onset: 2) in her mother.   ROS:  Please see the history of present illness.   Otherwise, review of systems is positive for dual changes, easy bruising.   All other systems are reviewed and negative.   PHYSICAL EXAM: VS:  BP 128/68   Pulse 68   Ht 5\' 3"  (1.6 m)   Wt 121 lb (54.9 kg)   BMI 21.43 kg/m  , BMI Body mass index is 21.43 kg/m. GEN: Well nourished, well developed, in no acute distress  HEENT: normal  Neck: no JVD, carotid bruits, or masses Cardiac: RRR; no murmurs, rubs, or gallops,no edema  Respiratory:  clear to auscultation bilaterally, normal work of breathing GI: soft, nontender, nondistended, + BS MS: no deformity or atrophy  Skin: warm and dry Neuro:  Strength and sensation are intact Psych: euthymic mood, full affect  EKG:  EKG is ordered today. Personal review of the ekg ordered shows this rhythm, PAC, low voltage  Recent Labs: 07/10/2018: Hemoglobin 12.7; Platelets 288.0 01/10/2019: ALT 14; BUN 11; Creatinine, Ser 0.63; Potassium 4.2; Sodium 139; TSH 2.53    Lipid Panel     Component Value Date/Time   CHOL 151 01/10/2019 0821   TRIG 102.0 01/10/2019 0821   HDL 62.10 01/10/2019 0821   CHOLHDL 2 01/10/2019 0821   VLDL 20.4 01/10/2019 0821   LDLCALC 68 01/10/2019 0821     Wt Readings from Last 3 Encounters:  02/06/19 121 lb (54.9 kg)  01/17/19 123 lb (55.8 kg)  01/10/19 120 lb (54.4 kg)      Other studies Reviewed: Additional studies/ records that were reviewed today include: 07/09/15 TTE Review of the above records today demonstrates: - Normal LV function; elevated LV filling pressure; mild AI; thickened MV with prolapse of the anterior leaflet; eccentric, posteriorly directed MR difficult to quantitate but appears to be mild (may be underestimated due to eccentric nature); mild TR, mildly elevated pulmonary pressures.    ASSESSMENT AND PLAN:  1.   PERSISTENT ATRIAL FIBRILLATION:  Currently on Xarelto.  No further episodes of atrial fibrillation since ablation.  No changes.   This patients CHA2DS2-VASc Score and unadjusted Ischemic Stroke Rate (% per year) is equal to 2.2 % stroke rate/year from a score of 2  Above score calculated as 1 point each if present [CHF, HTN, DM, Vascular=MI/PAD/Aortic Plaque, Age if 65-74, or Female] Above score calculated as 2 points each if present [Age > 75, or Stroke/TIA/TE]    2. Hyperlipidemia: Zocor per primary.  Current medicines are reviewed at length with the patient today.   The patient does not have concerns regarding her medicines.  The following changes were made today: None  Labs/ tests ordered today include:  Orders Placed This Encounter  Procedures  . EKG 12-Lead  Disposition:   FU with Delorean Knutzen 6 months  Signed, Gabriel Conry Meredith Leeds, MD  02/06/2019 10:07 AM     [facility] [address] [place] [address] [phone] (office) [phone] (fax)

## 2019-02-06 NOTE — Patient Instructions (Signed)
Medication Instructions:  Your physician recommends that you continue on your current medications as directed. Please refer to the Current Medication list given to you today.  * If you need a refill on your cardiac medications before your next appointment, please call your pharmacy.   Labwork: None ordered *We will only notify you of abnormal results, otherwise continue current treatment plan.  Testing/Procedures: None ordered  Follow-Up: Your physician wants you to follow-up in: 6 months with Dr. Camnitz.  You will receive a reminder letter in the mail two months in advance. If you don't receive a letter, please call our office to schedule the follow-up appointment.  *Please note that any paperwork needing to be filled out by the provider will need to be addressed at the front desk prior to seeing the provider. Please note that any FMLA, disability or other documents regarding health condition is subject to a $25.00 charge that must be received prior to completion of paperwork in the form of a money order or check.  Thank you for choosing [facility]!!   Kairen Hallinan, RN [phone]  Any Other Special Instructions Will Be Listed Below (If Applicable).        

## 2019-03-07 ENCOUNTER — Other Ambulatory Visit: Payer: Self-pay

## 2019-03-07 ENCOUNTER — Encounter (INDEPENDENT_AMBULATORY_CARE_PROVIDER_SITE_OTHER): Payer: PPO | Admitting: Ophthalmology

## 2019-03-07 DIAGNOSIS — H35033 Hypertensive retinopathy, bilateral: Secondary | ICD-10-CM

## 2019-03-07 DIAGNOSIS — I10 Essential (primary) hypertension: Secondary | ICD-10-CM

## 2019-03-07 DIAGNOSIS — H353111 Nonexudative age-related macular degeneration, right eye, early dry stage: Secondary | ICD-10-CM

## 2019-03-07 DIAGNOSIS — H2513 Age-related nuclear cataract, bilateral: Secondary | ICD-10-CM | POA: Diagnosis not present

## 2019-03-07 DIAGNOSIS — M62552 Muscle wasting and atrophy, not elsewhere classified, left thigh: Secondary | ICD-10-CM | POA: Diagnosis not present

## 2019-03-07 DIAGNOSIS — H43813 Vitreous degeneration, bilateral: Secondary | ICD-10-CM | POA: Diagnosis not present

## 2019-03-07 DIAGNOSIS — Z96652 Presence of left artificial knee joint: Secondary | ICD-10-CM | POA: Diagnosis not present

## 2019-03-07 DIAGNOSIS — H353211 Exudative age-related macular degeneration, right eye, with active choroidal neovascularization: Secondary | ICD-10-CM | POA: Diagnosis not present

## 2019-03-07 DIAGNOSIS — H353231 Exudative age-related macular degeneration, bilateral, with active choroidal neovascularization: Secondary | ICD-10-CM | POA: Diagnosis not present

## 2019-03-07 DIAGNOSIS — H353221 Exudative age-related macular degeneration, left eye, with active choroidal neovascularization: Secondary | ICD-10-CM | POA: Diagnosis not present

## 2019-03-07 DIAGNOSIS — D509 Iron deficiency anemia, unspecified: Secondary | ICD-10-CM | POA: Diagnosis not present

## 2019-03-07 DIAGNOSIS — I350 Nonrheumatic aortic (valve) stenosis: Secondary | ICD-10-CM | POA: Diagnosis not present

## 2019-03-07 DIAGNOSIS — M1712 Unilateral primary osteoarthritis, left knee: Secondary | ICD-10-CM | POA: Diagnosis not present

## 2019-03-07 DIAGNOSIS — I25118 Atherosclerotic heart disease of native coronary artery with other forms of angina pectoris: Secondary | ICD-10-CM | POA: Diagnosis not present

## 2019-03-07 DIAGNOSIS — R2689 Other abnormalities of gait and mobility: Secondary | ICD-10-CM | POA: Diagnosis not present

## 2019-03-07 DIAGNOSIS — M25562 Pain in left knee: Secondary | ICD-10-CM | POA: Diagnosis not present

## 2019-04-14 ENCOUNTER — Other Ambulatory Visit: Payer: Self-pay | Admitting: Cardiovascular Disease

## 2019-04-18 ENCOUNTER — Other Ambulatory Visit: Payer: Self-pay

## 2019-04-18 ENCOUNTER — Encounter (INDEPENDENT_AMBULATORY_CARE_PROVIDER_SITE_OTHER): Payer: PPO | Admitting: Ophthalmology

## 2019-04-18 DIAGNOSIS — I10 Essential (primary) hypertension: Secondary | ICD-10-CM | POA: Diagnosis not present

## 2019-04-18 DIAGNOSIS — H353221 Exudative age-related macular degeneration, left eye, with active choroidal neovascularization: Secondary | ICD-10-CM

## 2019-04-18 DIAGNOSIS — H43813 Vitreous degeneration, bilateral: Secondary | ICD-10-CM | POA: Diagnosis not present

## 2019-04-18 DIAGNOSIS — H353111 Nonexudative age-related macular degeneration, right eye, early dry stage: Secondary | ICD-10-CM

## 2019-04-18 DIAGNOSIS — H35033 Hypertensive retinopathy, bilateral: Secondary | ICD-10-CM | POA: Diagnosis not present

## 2019-04-18 DIAGNOSIS — H2513 Age-related nuclear cataract, bilateral: Secondary | ICD-10-CM | POA: Diagnosis not present

## 2019-05-21 ENCOUNTER — Other Ambulatory Visit: Payer: Self-pay | Admitting: Internal Medicine

## 2019-05-30 ENCOUNTER — Encounter (INDEPENDENT_AMBULATORY_CARE_PROVIDER_SITE_OTHER): Payer: PPO | Admitting: Ophthalmology

## 2019-05-30 ENCOUNTER — Other Ambulatory Visit: Payer: Self-pay

## 2019-05-30 DIAGNOSIS — H2513 Age-related nuclear cataract, bilateral: Secondary | ICD-10-CM

## 2019-05-30 DIAGNOSIS — H353112 Nonexudative age-related macular degeneration, right eye, intermediate dry stage: Secondary | ICD-10-CM

## 2019-05-30 DIAGNOSIS — H35033 Hypertensive retinopathy, bilateral: Secondary | ICD-10-CM | POA: Diagnosis not present

## 2019-05-30 DIAGNOSIS — I10 Essential (primary) hypertension: Secondary | ICD-10-CM | POA: Diagnosis not present

## 2019-05-30 DIAGNOSIS — H353221 Exudative age-related macular degeneration, left eye, with active choroidal neovascularization: Secondary | ICD-10-CM | POA: Diagnosis not present

## 2019-05-30 DIAGNOSIS — H43813 Vitreous degeneration, bilateral: Secondary | ICD-10-CM

## 2019-07-11 ENCOUNTER — Encounter: Payer: Self-pay | Admitting: Internal Medicine

## 2019-07-11 ENCOUNTER — Other Ambulatory Visit: Payer: Self-pay

## 2019-07-11 ENCOUNTER — Ambulatory Visit (INDEPENDENT_AMBULATORY_CARE_PROVIDER_SITE_OTHER): Payer: PPO | Admitting: Internal Medicine

## 2019-07-11 ENCOUNTER — Other Ambulatory Visit (INDEPENDENT_AMBULATORY_CARE_PROVIDER_SITE_OTHER): Payer: PPO

## 2019-07-11 VITALS — BP 120/80 | HR 65 | Temp 97.7°F | Ht 63.0 in | Wt 120.0 lb

## 2019-07-11 DIAGNOSIS — E785 Hyperlipidemia, unspecified: Secondary | ICD-10-CM

## 2019-07-11 DIAGNOSIS — K219 Gastro-esophageal reflux disease without esophagitis: Secondary | ICD-10-CM | POA: Diagnosis not present

## 2019-07-11 DIAGNOSIS — I480 Paroxysmal atrial fibrillation: Secondary | ICD-10-CM | POA: Diagnosis not present

## 2019-07-11 DIAGNOSIS — Z1211 Encounter for screening for malignant neoplasm of colon: Secondary | ICD-10-CM | POA: Insufficient documentation

## 2019-07-11 DIAGNOSIS — Z7184 Encounter for health counseling related to travel: Secondary | ICD-10-CM | POA: Insufficient documentation

## 2019-07-11 LAB — HEPATIC FUNCTION PANEL
ALT: 15 U/L (ref 0–35)
AST: 20 U/L (ref 0–37)
Albumin: 4.3 g/dL (ref 3.5–5.2)
Alkaline Phosphatase: 101 U/L (ref 39–117)
Bilirubin, Direct: 0.1 mg/dL (ref 0.0–0.3)
Total Bilirubin: 0.6 mg/dL (ref 0.2–1.2)
Total Protein: 7 g/dL (ref 6.0–8.3)

## 2019-07-11 LAB — BASIC METABOLIC PANEL
BUN: 11 mg/dL (ref 6–23)
CO2: 28 mEq/L (ref 19–32)
Calcium: 9.4 mg/dL (ref 8.4–10.5)
Chloride: 101 mEq/L (ref 96–112)
Creatinine, Ser: 0.63 mg/dL (ref 0.40–1.20)
GFR: 92.67 mL/min (ref >60.00)
Glucose, Bld: 101 mg/dL — ABNORMAL HIGH (ref 70–99)
Potassium: 4.8 mEq/L (ref 3.5–5.1)
Sodium: 137 mEq/L (ref 135–145)

## 2019-07-11 LAB — LIPID PANEL
Cholesterol: 165 mg/dL (ref 0–200)
HDL: 70.4 mg/dL (ref >39.00)
LDL Cholesterol: 77 mg/dL (ref 0–99)
NonHDL: 94.53
Total CHOL/HDL Ratio: 2
Triglycerides: 88 mg/dL (ref 0.0–149.0)
VLDL: 17.6 mg/dL (ref 0.0–40.0)

## 2019-07-11 MED ORDER — SHINGRIX 50 MCG/0.5ML IM SUSR: 0.5000 mL | Freq: Once | INTRAMUSCULAR | 1 refills | Status: AC

## 2019-07-11 NOTE — Assessment & Plan Note (Signed)
Appt w/dr Fuller Plan

## 2019-07-11 NOTE — Assessment & Plan Note (Signed)
Pepcid?

## 2019-07-11 NOTE — Assessment & Plan Note (Signed)
Xarelto

## 2019-07-11 NOTE — Patient Instructions (Signed)
If you have medicare related insurance (such as traditional Medicare, Blue Cross Medicare, United HealthCare Medicare, or similar), Please make an appointment at the scheduling desk with Jill, the Wellness Health Coach, for your Wellness visit in this office, which is a benefit with your insurance.  

## 2019-07-11 NOTE — Assessment & Plan Note (Signed)
Simvastatin 

## 2019-07-11 NOTE — Progress Notes (Signed)
Subjective:  Patient ID: Vicki [name], female    DOB: Oct 18, 1946  Age: 73 y.o. MRN: [id number]  CC: No chief complaint on file.   HPI Vicki [name] presents for GERD, anxiety, dyslipidemia f/u  Outpatient Medications Prior to Visit  Medication Sig Dispense Refill  . acetaminophen (TYLENOL) 325 MG tablet Take 500 mg by mouth every 6 (six) hours as needed.    . cholecalciferol (VITAMIN D) 1000 UNITS tablet Take 1,000 Units by mouth 2 (two) times daily.    . hydroxypropyl methylcellulose / hypromellose (ISOPTO TEARS / GONIOVISC) 2.5 % ophthalmic solution Place 1 drop into both eyes as needed for dry eyes.    Marland Kitchen loratadine (CLARITIN) 10 MG tablet Take 10 mg by mouth daily.    . Melatonin 3 MG TABS Take 3 mg by mouth as needed.     . Multiple Vitamins-Minerals (ICAPS) TABS Take 2 tablets by mouth 2 (two) times daily.    . Omega-3 Fatty Acids (FISH OIL) 1000 MG CAPS Take by mouth.    Marland Kitchen PEPCID AC MAXIMUM STRENGTH 20 MG tablet Take 1 tablet by mouth once daily 90 tablet 1  . rivaroxaban (XARELTO) 20 MG TABS tablet Take 1 tablet (20 mg total) by mouth daily. 90 tablet 1  . simvastatin (ZOCOR) 20 MG tablet TAKE 1 TABLET BY MOUTH AT BEDTIME 90 tablet 3  . triamcinolone ointment (KENALOG) 0.5 % APPLY OINTMENT TOPICALLY TWICE DAILY TO HAND RASH 45 g 0  . trimethoprim-polymyxin b (POLYTRIM) ophthalmic solution Place 1 drop into the left eye See admin instructions. After eye injection, every 5 weeks, 1 drop 4 times daily in Left eye for 2 days     No facility-administered medications prior to visit.     ROS: Review of Systems  Objective:  BP 120/80 (BP Location: Left Arm, Patient Position: Sitting, Cuff Size: Normal)   Pulse 65   Temp 97.7 F (36.5 C) (Oral)   Ht 5\' 3"  (1.6 m)   Wt 120 lb (54.4 kg)   SpO2 97%   BMI 21.26 kg/m   BP Readings from Last 3 Encounters:  07/11/19 120/80  02/06/19 128/68  01/17/19 118/68    Wt Readings from Last 3 Encounters:  07/11/19 120 lb (54.4  kg)  02/06/19 121 lb (54.9 kg)  01/17/19 123 lb (55.8 kg)    Physical Exam  Lab Results  Component Value Date   WBC 5.0 07/10/2018   HGB 12.7 07/10/2018   HCT 37.2 07/10/2018   PLT 288.0 07/10/2018   GLUCOSE 90 01/10/2019   CHOL 151 01/10/2019   TRIG 102.0 01/10/2019   HDL 62.10 01/10/2019   LDLCALC 68 01/10/2019   ALT 14 01/10/2019   AST 17 01/10/2019   NA 139 01/10/2019   K 4.2 01/10/2019   CL 101 01/10/2019   CREATININE 0.63 01/10/2019   BUN 11 01/10/2019   CO2 28 01/10/2019   TSH 2.53 01/10/2019   INR 1.41 08/07/2015    Mm 3d Screen Breast Bilateral  Result Date: 01/15/2019 CLINICAL DATA:  Screening. EXAM: DIGITAL SCREENING BILATERAL MAMMOGRAM WITH TOMO AND CAD COMPARISON:  Previous exam(s). ACR Breast Density Category b: There are scattered areas of fibroglandular density. FINDINGS: There are no findings suspicious for malignancy. Images were processed with CAD. IMPRESSION: No mammographic evidence of malignancy. A result letter of this screening mammogram will be mailed directly to the patient. RECOMMENDATION: Screening mammogram in one year. (Code:SM-B-01Y) BI-RADS CATEGORY  1: Negative. Electronically Signed   By: Sharyn Lull  Theda Sers M.D.   On: 01/15/2019 13:32    Assessment & Plan:   There are no diagnoses linked to this encounter.   No orders of the defined types were placed in this encounter.    Follow-up: No follow-ups on file.  Walker Kehr, MD

## 2019-07-18 ENCOUNTER — Other Ambulatory Visit: Payer: Self-pay

## 2019-07-18 ENCOUNTER — Encounter (INDEPENDENT_AMBULATORY_CARE_PROVIDER_SITE_OTHER): Payer: PPO | Admitting: Ophthalmology

## 2019-07-18 DIAGNOSIS — H353111 Nonexudative age-related macular degeneration, right eye, early dry stage: Secondary | ICD-10-CM | POA: Diagnosis not present

## 2019-07-18 DIAGNOSIS — H35033 Hypertensive retinopathy, bilateral: Secondary | ICD-10-CM

## 2019-07-18 DIAGNOSIS — H43813 Vitreous degeneration, bilateral: Secondary | ICD-10-CM | POA: Diagnosis not present

## 2019-07-18 DIAGNOSIS — H353221 Exudative age-related macular degeneration, left eye, with active choroidal neovascularization: Secondary | ICD-10-CM | POA: Diagnosis not present

## 2019-07-18 DIAGNOSIS — I10 Essential (primary) hypertension: Secondary | ICD-10-CM

## 2019-07-18 DIAGNOSIS — H2513 Age-related nuclear cataract, bilateral: Secondary | ICD-10-CM

## 2019-08-22 ENCOUNTER — Other Ambulatory Visit: Payer: Self-pay | Admitting: Cardiovascular Disease

## 2019-08-23 ENCOUNTER — Encounter: Payer: Self-pay | Admitting: Gastroenterology

## 2019-08-23 ENCOUNTER — Telehealth: Payer: Self-pay

## 2019-08-23 ENCOUNTER — Other Ambulatory Visit: Payer: Self-pay

## 2019-08-23 ENCOUNTER — Ambulatory Visit (INDEPENDENT_AMBULATORY_CARE_PROVIDER_SITE_OTHER): Payer: PPO | Admitting: Gastroenterology

## 2019-08-23 VITALS — BP 136/62 | HR 72 | Temp 97.8°F | Ht 63.0 in | Wt 122.0 lb

## 2019-08-23 DIAGNOSIS — I480 Paroxysmal atrial fibrillation: Secondary | ICD-10-CM

## 2019-08-23 DIAGNOSIS — Z1211 Encounter for screening for malignant neoplasm of colon: Secondary | ICD-10-CM

## 2019-08-23 DIAGNOSIS — Z7901 Long term (current) use of anticoagulants: Secondary | ICD-10-CM

## 2019-08-23 MED ORDER — NA SULFATE-K SULFATE-MG SULF 17.5-3.13-1.6 GM/177ML PO SOLN: 1.0000 | Freq: Once | ORAL | 0 refills | Status: AC

## 2019-08-23 NOTE — Progress Notes (Signed)
[name] Consult Note:  History: Vicki [name] 08/23/2019  Referring provider: Cassandria Anger, MD  Reason for consult/chief complaint: Colon Cancer Screening (Patient here to discuss recall colonoscopy; was advised we need to see her first due to [place] usage; Dr Gwenlyn Found prescribes; Patient denies any current GI issues)   Subjective  HPI:  Vicki [name] was referred to [place] for colon cancer screening.  Last colonoscopy for screening by Dr. Sharlett Iles on 01/26/2008: Left-sided diverticulosis, hyperplastic sigmoid polyps. Last cardiology note from 02/06/2019 was reviewed.  She is on Xarelto for A. fib with score of 2, status post A. fib ablation June 2017.  Patient was reportedly feeling well at the most recent cardiology visit. Last echocardiogram August 2016, normal LV function.  She feels well, is very active walking daily and does not get chest pain or dyspnea.  She denies chronic abdominal pain, altered bowel habits or rectal bleeding.  ROS:  Review of Systems  Constitutional: Negative for appetite change and unexpected weight change.  HENT: Negative for mouth sores and voice change.   Eyes: Negative for pain and redness.  Respiratory: Negative for cough and shortness of breath.   Cardiovascular: Negative for chest pain and palpitations.  Genitourinary: Negative for dysuria and hematuria.  Musculoskeletal: Positive for arthralgias. Negative for myalgias.  Skin: Negative for pallor and rash.  Neurological: Negative for weakness and headaches.  Hematological: Negative for adenopathy.     Past Medical History: Past Medical History:  Diagnosis Date  . Adenocarcinoma, breast ([place])   . Allergic rhinitis   . Anxiety   . Aortic insufficiency   . Breast cancer [facility]) 2008   Left Breast Cancer  . Contact dermatitis   . Diverticulosis of colon   . DJD (degenerative joint disease)   . GERD (gastroesophageal reflux disease)   . Hx of colonic polyps   .  Hyperlipidemia   . Macular degeneration, bilateral   . Osteopenia   . Paroxysmal atrial fibrillation (HCC)    A. fib is now persistent.  . Personal history of radiation therapy 2008   Left Breast Cancer  . Visit for monitoring Tikosyn therapy 09/06/2015     Past Surgical History: Past Surgical History:  Procedure Laterality Date  . ABDOMINAL HYSTERECTOMY  1984   with 1 ovary removal  . BREAST LUMPECTOMY Left 08/2007    with node bx  . ELECTROPHYSIOLOGIC STUDY N/A 05/07/2016   Procedure: Atrial Fibrillation Ablation;  Surgeon: Will Meredith Leeds, MD;  Location: [facility];  Service: Cardiovascular;  Laterality: N/A;     Family History: Family History  Problem Relation Age of Onset  . Heart disease Father 38  . Heart attack Father   . Other Mother 13       AVR  . Heart disease Mother   . Clotting disorder Mother 37  . Breast cancer Sister   . Lymphoma Sister   . Hypertension Sister   . Healthy Brother   . Other Maternal Grandmother   . Colon cancer Neg Hx   . Esophageal cancer Neg Hx   . Pancreatic cancer Neg Hx     Social History: Social History   Socioeconomic History  . Marital status: Married    Spouse name: Not on file  . Number of children: 1  . Years of education: Not on file  . Highest education level: Not on file  Occupational History  . Occupation: ACCOUNTING    Employer: [employer]  . Financial  resource strain: Not hard at all  . Food insecurity    Worry: Never true    Inability: Never true  . Transportation needs    Medical: No    Non-medical: No  Tobacco Use  . Smoking status: Former Smoker    Types: Cigarettes    Quit date: 12/06/1976    Years since quitting: 42.7  . Smokeless tobacco: Never Used  . Tobacco comment: smoked approx 10 years  Substance and Sexual Activity  . Alcohol use: No    Alcohol/week: 0.0 standard drinks  . Drug use: No  . Sexual activity: Not Currently  Lifestyle  . Physical activity     Days per week: 5 days    Minutes per session: 40 min  . Stress: Not at all  Relationships  . Social connections    Talks on phone: More than three times a week    Gets together: More than three times a week    Attends religious service: More than 4 times per year    Active member of club or organization: Yes    Attends meetings of clubs or organizations: More than 4 times per year    Relationship status: Married  Other Topics Concern  . Not on file  Social History Narrative  . Not on file    Allergies: Allergies  Allergen Reactions  . Codeine     REACTION: unknown    Outpatient Meds: Current Outpatient Medications  Medication Sig Dispense Refill  . acetaminophen (TYLENOL) 325 MG tablet Take 500 mg by mouth every 6 (six) hours as needed.    . cholecalciferol (VITAMIN D) 1000 UNITS tablet Take 1,000 Units by mouth 2 (two) times daily.    . hydroxypropyl methylcellulose / hypromellose (ISOPTO TEARS / GONIOVISC) 2.5 % ophthalmic solution Place 1 drop into both eyes as needed for dry eyes.    Marland Kitchen loratadine (CLARITIN) 10 MG tablet Take 10 mg by mouth daily.    . Melatonin 3 MG TABS Take 3 mg by mouth at bedtime as needed.     . Multiple Vitamins-Minerals (ICAPS) TABS Take 1 tablet by mouth 2 (two) times daily.     Marland Kitchen PEPCID AC MAXIMUM STRENGTH 20 MG tablet Take 1 tablet by mouth once daily 90 tablet 1  . rivaroxaban (XARELTO) 20 MG TABS tablet Take 1 tablet (20 mg total) by mouth daily. 90 tablet 1  . simvastatin (ZOCOR) 20 MG tablet TAKE 1 TABLET BY MOUTH AT BEDTIME 90 tablet 3  . triamcinolone ointment (KENALOG) 0.5 % APPLY OINTMENT TOPICALLY TWICE DAILY TO HAND RASH (Patient taking differently: as needed. ) 45 g 0  . trimethoprim-polymyxin b (POLYTRIM) ophthalmic solution Place 1 drop into the left eye See admin instructions. After eye injection, every 5 weeks, 1 drop 4 times daily in Left eye for 2 days     No current facility-administered medications for this visit.        ___________________________________________________________________ Objective   Exam:  BP 136/62   Pulse 72   Temp 97.8 F (36.6 C)   Ht 5\' 3"  (1.6 m)   Wt 122 lb (55.3 kg)   BMI 21.61 kg/m    General: Well-appearing woman, ambulatory, pleasant and conversational  Eyes: sclera anicteric, no redness  ENT: oral mucosa moist without lesions, no cervical or supraclavicular lymphadenopathy  CV: RRR without murmur, S1/S2, no JVD, no peripheral edema  Resp: clear to auscultation bilaterally, normal RR and effort noted  GI: soft, no tenderness, with active bowel sounds.  No guarding or palpable organomegaly noted.  Skin; warm and dry, no rash or jaundice noted  Neuro: awake, alert and oriented x 3. Normal gross motor function and fluent speech   Assessment: Encounter Diagnoses  Name Primary?  . Special screening for malignant neoplasms, colon Yes  . Current use of long term anticoagulation   . Paroxysmal atrial fibrillation (HCC)     Average risk colorectal cancer, she was agreeable to a screening colonoscopy.  Risks and benefits were reviewed.  The benefits and risks of the planned procedure were described in detail with the patient or (when appropriate) their health care proxy.  Risks were outlined as including, but not limited to, bleeding, infection, perforation, adverse medication reaction leading to cardiac or pulmonary decompensation.  The limitation of incomplete mucosal visualization was also discussed.  No guarantees or warranties were given.  Last dose of [place] about 36 hours before procedure.  This appears to be low risk based on her cardiac status.  We will confer with cardiology to be sure.  She understands the small but real risk of cerebrovascular event while off anticoagulation.  Risk-benefit ratio favors undergoing the procedure.  Thank you for the courtesy of this consult.  Please call me with any questions or concerns.  Nelida Meuse III  CC: Referring  provider noted above

## 2019-08-23 NOTE — Telephone Encounter (Signed)
Prescription refill request for Xarelto received.   Last office visit: [place] (02-06-2019) Weight: 54.4 kg (07-11-2019) Age: 73 y.o. Scr: 0.63 (07-11-2019) CrCl: 69 ml/min  Prescription refill sent.

## 2019-08-23 NOTE — Telephone Encounter (Signed)
Patient with diagnosis of afib on Xarelto for anticoagulation.    Procedure: Colonoscopy Date of procedure: 10/01/2019  CHADS2-VASc score of  3 (HTN, AGE, female)  CrCl 66 ml/min  Per office protocol, patient can hold Xarelto for 2 days prior to procedure.

## 2019-08-23 NOTE — Telephone Encounter (Signed)
Patient has been notified and aware. She states clear understanding to hold 2 nights prior to her colonoscopy.

## 2019-08-23 NOTE — Patient Instructions (Addendum)
If you are age 73 or older, your body mass index should be between 23-30. Your Body mass index is 21.61 kg/m. If this is out of the aforementioned range listed, please consider follow up with your Primary Care Provider.  If you are age 58 or younger, your body mass index should be between 19-25. Your Body mass index is 21.61 kg/m. If this is out of the aformentioned range listed, please consider follow up with your Primary Care Provider.   You will be contacted by our office prior to your procedure for directions on holding your Xarelto.  If you do not hear from our office 1 week prior to your scheduled procedure, please call [phone] to discuss.   You have been scheduled for a colonoscopy. Please follow written instructions given to you at your visit today.  Please pick up your prep supplies at the pharmacy within the next 1-3 days. If you use inhalers (even only as needed), please bring them with you on the day of your procedure. Your physician has requested that you go to www.startemmi.com and enter the access code given to you at your visit today. This web site gives a general overview about your procedure. However, you should still follow specific instructions given to you by our office regarding your preparation for the procedure.  It was a pleasure to see you today!  Dr. Loletha Carrow

## 2019-08-23 NOTE — Telephone Encounter (Signed)
[facility] Pre-operative Risk Assessment     Request for surgical clearance:     Endoscopy Procedure  What type of surgery is being performed?     Colonoscopy   When is this surgery scheduled?     10-01-2019  What type of clearance is required ?   Pharmacy  Are there any medications that need to be held prior to surgery and how long? Yes, Vicki [name] would like to hold 2 nights   Practice name and name of physician performing surgery?      [facility]  What is your office phone and fax number?      Phone- [phone]  [phone]  Anesthesia type (None, local, MAC, general) ?       MAC

## 2019-08-27 ENCOUNTER — Other Ambulatory Visit: Payer: Self-pay

## 2019-08-27 ENCOUNTER — Ambulatory Visit: Payer: PPO | Admitting: Cardiology

## 2019-08-27 ENCOUNTER — Encounter: Payer: Self-pay | Admitting: Cardiology

## 2019-08-27 VITALS — BP 130/84 | HR 63 | Ht 63.0 in | Wt 125.0 lb

## 2019-08-27 DIAGNOSIS — I4819 Other persistent atrial fibrillation: Secondary | ICD-10-CM

## 2019-08-27 NOTE — Progress Notes (Signed)
Electrophysiology Office Note   Date:  08/27/2019   ID:  Vicki, [name] Jan 29, 1946, MRN [id number]  PCP:  Cassandria Anger, MD  Cardiologist:  Quay Burow Primary Electrophysiologist: Constance Haw, MD    No chief complaint on file.    History of Present Illness: Vicki [name] is a 73 y.o. female who presents today for electrophysiology evaluation.   She has a history of persistent atrial fibrillation, hyperlipidemia, and aortic insufficiency. She is on Xarelto for [facility] of 2.  AF ablation 05/07/16.   Today, denies symptoms of palpitations, chest pain, shortness of breath, orthopnea, PND, lower extremity edema, claudication, dizziness, presyncope, syncope, bleeding, or neurologic sequela. The patient is tolerating medications without difficulties.  Overall she is doing well.  She has noted no further episodes of atrial fibrillation since ablation.   Past Medical History:  Diagnosis Date  . Adenocarcinoma, breast ([place])   . Allergic rhinitis   . Anxiety   . Aortic insufficiency   . Breast cancer [facility]) 2008   Left Breast Cancer  . Contact dermatitis   . Diverticulosis of colon   . DJD (degenerative joint disease)   . GERD (gastroesophageal reflux disease)   . Hx of colonic polyps   . Hyperlipidemia   . Macular degeneration, bilateral   . Osteopenia   . Paroxysmal atrial fibrillation (HCC)    A. fib is now persistent.  . Personal history of radiation therapy 2008   Left Breast Cancer  . Visit for monitoring Tikosyn therapy 09/06/2015   Past Surgical History:  Procedure Laterality Date  . ABDOMINAL HYSTERECTOMY  1984   with 1 ovary removal  . BREAST LUMPECTOMY Left 08/2007    with node bx  . ELECTROPHYSIOLOGIC STUDY N/A 05/07/2016   Procedure: Atrial Fibrillation Ablation;  Surgeon: Marckus Hanover Meredith Leeds, MD;  Location: [facility];  Service: Cardiovascular;  Laterality: N/A;     Current Outpatient Medications  Medication Sig Dispense  Refill  . acetaminophen (TYLENOL) 325 MG tablet Take 500 mg by mouth every 6 (six) hours as needed.    . cholecalciferol (VITAMIN D) 1000 UNITS tablet Take 1,000 Units by mouth 2 (two) times daily.    . hydroxypropyl methylcellulose / hypromellose (ISOPTO TEARS / GONIOVISC) 2.5 % ophthalmic solution Place 1 drop into both eyes as needed for dry eyes.    Marland Kitchen loratadine (CLARITIN) 10 MG tablet Take 10 mg by mouth daily.    . Melatonin 3 MG TABS Take 3 mg by mouth at bedtime as needed.     . Multiple Vitamins-Minerals (ICAPS) TABS Take 1 tablet by mouth 2 (two) times daily.     Marland Kitchen PEPCID AC MAXIMUM STRENGTH 20 MG tablet Take 1 tablet by mouth once daily 90 tablet 1  . simvastatin (ZOCOR) 20 MG tablet TAKE 1 TABLET BY MOUTH AT BEDTIME 90 tablet 3  . triamcinolone ointment (KENALOG) 0.5 % Apply 1 application topically as needed.    . trimethoprim-polymyxin b (POLYTRIM) ophthalmic solution Place 1 drop into the left eye See admin instructions. After eye injection, every 5 weeks, 1 drop 4 times daily in Left eye for 2 days    . XARELTO 20 MG TABS tablet Take 1 tablet by mouth once daily 90 tablet 1   No current facility-administered medications for this visit.     Allergies:   Codeine   Social History:  The patient  reports that she quit smoking about 42 years ago. Her smoking use included cigarettes. She  has never used smokeless tobacco. She reports that she does not drink alcohol or use drugs.   Family History:  The patient's family history includes Breast cancer in her sister; Clotting disorder (age of onset: 64) in her mother; Healthy in her brother; Heart attack in her father; Heart disease in her mother; Heart disease (age of onset: 48) in her father; Hypertension in her sister; Lymphoma in her sister; Other in her maternal grandmother; Other (age of onset: 62) in her mother.   ROS:  Please see the history of present illness.   Otherwise, review of systems is positive for none.   All other systems  are reviewed and negative.   PHYSICAL EXAM: VS:  BP 130/84   Pulse 63   Ht 5\' 3"  (1.6 m)   Wt 125 lb (56.7 kg)   SpO2 98%   BMI 22.14 kg/m  , BMI Body mass index is 22.14 kg/m. GEN: Well nourished, well developed, in no acute distress  HEENT: normal  Neck: no JVD, carotid bruits, or masses Cardiac: RRR; no murmurs, rubs, or gallops,no edema  Respiratory:  clear to auscultation bilaterally, normal work of breathing GI: soft, nontender, nondistended, + BS MS: no deformity or atrophy  Skin: warm and dry Neuro:  Strength and sensation are intact Psych: euthymic mood, full affect  EKG:  EKG is ordered today. Personal review of the ekg ordered shows this rhythm, IVCD, rate 63, anterior and inferior Q waves  Recent Labs: 01/10/2019: TSH 2.53 07/11/2019: ALT 15; BUN 11; Creatinine, Ser 0.63; Potassium 4.8; Sodium 137    Lipid Panel     Component Value Date/Time   CHOL 165 07/11/2019 0817   TRIG 88.0 07/11/2019 0817   HDL 70.40 07/11/2019 0817   CHOLHDL 2 07/11/2019 0817   VLDL 17.6 07/11/2019 0817   LDLCALC 77 07/11/2019 0817     Wt Readings from Last 3 Encounters:  08/27/19 125 lb (56.7 kg)  08/23/19 122 lb (55.3 kg)  07/11/19 120 lb (54.4 kg)      Other studies Reviewed: Additional studies/ records that were reviewed today include: 07/09/15 TTE Review of the above records today demonstrates: - Normal LV function; elevated LV filling pressure; mild AI; thickened MV with prolapse of the anterior leaflet; eccentric, posteriorly directed MR difficult to quantitate but appears to be mild (may be underestimated due to eccentric nature); mild TR, mildly elevated pulmonary pressures.    ASSESSMENT AND PLAN:  1.   PERSISTENT ATRIAL FIBRILLATION: Currently on Xarelto.  No further episodes since ablation on 05/07/2016.  No changes at this time.  This patients CHA2DS2-VASc Score and unadjusted Ischemic Stroke Rate (% per year) is equal to 2.2 % stroke rate/year from a  score of 2  Above score calculated as 1 point each if present [CHF, HTN, DM, Vascular=MI/PAD/Aortic Plaque, Age if 65-74, or Female] Above score calculated as 2 points each if present [Age > 75, or Stroke/TIA/TE]    2. Hyperlipidemia: Simvastatin per primary  Current medicines are reviewed at length with the patient today.   The patient does not have concerns regarding her medicines.  The following changes were made today: None  Labs/ tests ordered today include:  Orders Placed This Encounter  Procedures  . EKG 12-Lead     Disposition:   FU with Aideen Fenster 12 months  Signed, Adell Koval Meredith Leeds, MD  08/27/2019 10:39 AM     [facility] [address] [place] [address] [phone] (office) [phone] (fax)

## 2019-09-04 ENCOUNTER — Other Ambulatory Visit: Payer: Self-pay | Admitting: Internal Medicine

## 2019-09-12 ENCOUNTER — Encounter (INDEPENDENT_AMBULATORY_CARE_PROVIDER_SITE_OTHER): Payer: PPO | Admitting: Ophthalmology

## 2019-09-12 DIAGNOSIS — H35033 Hypertensive retinopathy, bilateral: Secondary | ICD-10-CM | POA: Diagnosis not present

## 2019-09-12 DIAGNOSIS — H353221 Exudative age-related macular degeneration, left eye, with active choroidal neovascularization: Secondary | ICD-10-CM | POA: Diagnosis not present

## 2019-09-12 DIAGNOSIS — H353111 Nonexudative age-related macular degeneration, right eye, early dry stage: Secondary | ICD-10-CM

## 2019-09-12 DIAGNOSIS — H2513 Age-related nuclear cataract, bilateral: Secondary | ICD-10-CM

## 2019-09-12 DIAGNOSIS — I10 Essential (primary) hypertension: Secondary | ICD-10-CM | POA: Diagnosis not present

## 2019-09-12 DIAGNOSIS — H43813 Vitreous degeneration, bilateral: Secondary | ICD-10-CM | POA: Diagnosis not present

## 2019-09-19 ENCOUNTER — Encounter: Payer: Self-pay | Admitting: Gastroenterology

## 2019-09-24 ENCOUNTER — Telehealth: Payer: Self-pay | Admitting: Gastroenterology

## 2019-09-24 NOTE — Telephone Encounter (Signed)
Clear liquid requirements reviewed and reviewed when to hold the Xarelto.

## 2019-09-24 NOTE — Telephone Encounter (Signed)
Pt is scheduled for 10/26 colon and would like to know when to stop Xalrelto.

## 2019-09-24 NOTE — Telephone Encounter (Signed)
Left message to return call 

## 2019-10-01 ENCOUNTER — Encounter: Payer: Self-pay | Admitting: Gastroenterology

## 2019-10-01 ENCOUNTER — Ambulatory Visit (AMBULATORY_SURGERY_CENTER): Payer: PPO | Admitting: Gastroenterology

## 2019-10-01 ENCOUNTER — Other Ambulatory Visit: Payer: Self-pay

## 2019-10-01 VITALS — BP 154/62 | HR 56 | Temp 97.5°F | Resp 22 | Ht 63.0 in | Wt 122.0 lb

## 2019-10-01 DIAGNOSIS — Z1211 Encounter for screening for malignant neoplasm of colon: Secondary | ICD-10-CM

## 2019-10-01 DIAGNOSIS — D120 Benign neoplasm of cecum: Secondary | ICD-10-CM | POA: Diagnosis not present

## 2019-10-01 MED ORDER — SODIUM CHLORIDE 0.9 % IV SOLN: 500.0000 mL | Freq: Once | INTRAVENOUS | Status: DC

## 2019-10-01 NOTE — Progress Notes (Signed)
Pt's states no medical or surgical changes since previsit or office visit. 

## 2019-10-01 NOTE — Patient Instructions (Signed)
YOU HAD AN ENDOSCOPIC PROCEDURE TODAY AT THE [facility]:   Refer to the procedure report that was given to you for any specific questions about what was found during the examination.  If the procedure report does not answer your questions, please call your gastroenterologist to clarify.  If you requested that your care partner not be given the details of your procedure findings, then the procedure report has been included in a sealed envelope for you to review at your convenience later.  **Handouts given on polyps and diverticulosis**  YOU SHOULD EXPECT: Some feelings of bloating in the abdomen. Passage of more gas than usual.  Walking can help get rid of the air that was put into your GI tract during the procedure and reduce the bloating. If you had a lower endoscopy (such as a colonoscopy or flexible sigmoidoscopy) you may notice spotting of blood in your stool or on the toilet paper. If you underwent a bowel prep for your procedure, you may not have a normal bowel movement for a few days.  Please Note:  You might notice some irritation and congestion in your nose or some drainage.  This is from the oxygen used during your procedure.  There is no need for concern and it should clear up in a day or so.  SYMPTOMS TO REPORT IMMEDIATELY:   Following lower endoscopy (colonoscopy or flexible sigmoidoscopy):  Excessive amounts of blood in the stool  Significant tenderness or worsening of abdominal pains  Swelling of the abdomen that is new, acute  Fever of 100F or higher   For urgent or emergent issues, a gastroenterologist can be reached at any hour by calling [phone].   DIET:  We do recommend a small meal at first, but then you may proceed to your regular diet.  Drink plenty of fluids but you should avoid alcoholic beverages for 24 hours.  ACTIVITY:  You should plan to take it easy for the rest of today and you should NOT DRIVE or use heavy machinery until tomorrow (because  of the sedation medicines used during the test).    FOLLOW UP: Our staff will call the number listed on your records 48-72 hours following your procedure to check on you and address any questions or concerns that you may have regarding the information given to you following your procedure. If we do not reach you, we will leave a message.  We will attempt to reach you two times.  During this call, we will ask if you have developed any symptoms of COVID 19. If you develop any symptoms (ie: fever, flu-like symptoms, shortness of breath, cough etc.) before then, please call [phone].  If you test positive for Covid 19 in the 2 weeks post procedure, please call and report this information to us.    If any biopsies were taken you will be contacted by phone or by letter within the next 1-3 weeks.  Please call us at [phone] if you have not heard about the biopsies in 3 weeks.    SIGNATURES/CONFIDENTIALITY: You and/or your care partner have signed paperwork which will be entered into your electronic medical record.  These signatures attest to the fact that that the information above on your After Visit Summary has been reviewed and is understood.  Full responsibility of the confidentiality of this discharge information lies with you and/or your care-partner. 

## 2019-10-01 NOTE — Progress Notes (Signed)
A and O x3. Report to RN. Tolerated MAC anesthesia well.

## 2019-10-01 NOTE — Op Note (Signed)
[facility] Patient Name: Vicki [name] Procedure Date: 10/01/2019 11:45 AM MRN: [id number] Endoscopist: Mallie Mussel L. Loletha Carrow , MD Age: 73 Referring MD:  Date of Birth: [birth year] Gender: Female Account #: [id number] Procedure:                Colonoscopy Indications:              Screening for colorectal malignant neoplasm (last                            colonoscopy 01/2008) Medicines:                Monitored Anesthesia Care Procedure:                Pre-Anesthesia Assessment:                           - Prior to the procedure, a History and Physical                            was performed, and patient medications and                            allergies were reviewed. The patient's tolerance of                            previous anesthesia was also reviewed. The risks                            and benefits of the procedure and the sedation                            options and risks were discussed with the patient.                            All questions were answered, and informed consent                            was obtained. Prior Anticoagulants: The patient has                            taken Xarelto (rivaroxaban), last dose was 2 days                            prior to procedure. ASA Grade Assessment: III - A                            patient with severe systemic disease. After                            reviewing the risks and benefits, the patient was                            deemed in satisfactory condition to undergo the  procedure.                           After obtaining informed consent, the colonoscope                            was passed under direct vision. Throughout the                            procedure, the patient's blood pressure, pulse, and                            oxygen saturations were monitored continuously. The                            Colonoscope was introduced through the anus and       advanced to the the cecum, identified by                            appendiceal orifice and ileocecal valve. The                            colonoscopy was performed without difficulty. The                            patient tolerated the procedure well. The quality                            of the bowel preparation was good. The ileocecal                            valve, appendiceal orifice, and rectum were                            photographed. Scope In: 11:56:16 AM Scope Out: 12:12:33 PM Scope Withdrawal Time: 0 hours 11 minutes 37 seconds  Total Procedure Duration: 0 hours 16 minutes 17 seconds  Findings:                 The perianal and digital rectal examinations were                            normal.                           Two sessile polyps were found in the cecum. The                            polyps were 5 mm in size. These polyps were removed                            with a cold snare. Resection and retrieval were                            complete.  Multiple diverticula were found in the left colon.                           The exam was otherwise without abnormality on                            direct and retroflexion views. Complications:            No immediate complications. Estimated Blood Loss:     Estimated blood loss was minimal. Impression:               - Two 5 mm polyps in the cecum, removed with a cold                            snare. Resected and retrieved.                           - Diverticulosis in the left colon.                           - The examination was otherwise normal on direct                            and retroflexion views. Recommendation:           - Patient has a contact number available for                            emergencies. The signs and symptoms of potential                            delayed complications were discussed with the                            patient. Return to normal activities  tomorrow.                            Written discharge instructions were provided to the                            patient.                           - Resume previous diet.                           - Resume Xarelto (rivaroxaban) at prior dose                            tomorrow.                           - Await pathology results.                           - Based on current guidelines, no repeat  surveillance colonoscopy due to age. Henry L. Loletha Carrow, MD 10/01/2019 12:17:47 PM This report has been signed electronically.

## 2019-10-01 NOTE — Progress Notes (Signed)
Called to room to assist during endoscopic procedure.  Patient ID and intended procedure confirmed with present staff. Received instructions for my participation in the procedure from the performing physician.  

## 2019-10-03 ENCOUNTER — Telehealth: Payer: Self-pay

## 2019-10-03 NOTE — Telephone Encounter (Signed)
  Follow up Call-  Call back number 10/01/2019  Post procedure Call Back phone  # [phone]  Permission to leave phone message Yes  Some recent data might be hidden     Patient questions:  Do you have a fever, pain , or abdominal swelling? No. Pain Score  0 *  Have you tolerated food without any problems? Yes.    Have you been able to return to your normal activities? Yes.    Do you have any questions about your discharge instructions: Diet   No. Medications  No. Follow up visit  No.  Do you have questions or concerns about your Care? No.  Actions: * If pain score is 4 or above: No action needed, pain <4.  1. Have you developed a fever since your procedure? no  2.   Have you had an respiratory symptoms (SOB or cough) since your procedure? no  3.   Have you tested positive for COVID 19 since your procedure no  4.   Have you had any family members/close contacts diagnosed with the COVID 19 since your procedure?  no   If yes to any of these questions please route to Joylene John, RN and Alphonsa Gin, Therapist, sports.

## 2019-10-04 ENCOUNTER — Encounter: Payer: Self-pay | Admitting: Gastroenterology

## 2019-11-15 ENCOUNTER — Other Ambulatory Visit: Payer: Self-pay | Admitting: Internal Medicine

## 2019-11-20 ENCOUNTER — Other Ambulatory Visit: Payer: Self-pay

## 2019-11-20 ENCOUNTER — Encounter (INDEPENDENT_AMBULATORY_CARE_PROVIDER_SITE_OTHER): Payer: PPO | Admitting: Ophthalmology

## 2019-11-20 DIAGNOSIS — H353112 Nonexudative age-related macular degeneration, right eye, intermediate dry stage: Secondary | ICD-10-CM

## 2019-11-20 DIAGNOSIS — H35033 Hypertensive retinopathy, bilateral: Secondary | ICD-10-CM

## 2019-11-20 DIAGNOSIS — H2513 Age-related nuclear cataract, bilateral: Secondary | ICD-10-CM

## 2019-11-20 DIAGNOSIS — H43813 Vitreous degeneration, bilateral: Secondary | ICD-10-CM

## 2019-11-20 DIAGNOSIS — H353221 Exudative age-related macular degeneration, left eye, with active choroidal neovascularization: Secondary | ICD-10-CM | POA: Diagnosis not present

## 2019-11-20 DIAGNOSIS — I10 Essential (primary) hypertension: Secondary | ICD-10-CM | POA: Diagnosis not present

## 2019-11-21 ENCOUNTER — Encounter (INDEPENDENT_AMBULATORY_CARE_PROVIDER_SITE_OTHER): Payer: PPO | Admitting: Ophthalmology

## 2019-12-10 ENCOUNTER — Other Ambulatory Visit: Payer: Self-pay | Admitting: Internal Medicine

## 2019-12-10 DIAGNOSIS — Z1231 Encounter for screening mammogram for malignant neoplasm of breast: Secondary | ICD-10-CM

## 2020-01-03 DIAGNOSIS — U071 COVID-19: Secondary | ICD-10-CM | POA: Diagnosis not present

## 2020-01-05 ENCOUNTER — Other Ambulatory Visit: Payer: Self-pay | Admitting: Internal Medicine

## 2020-01-05 MED ORDER — HYDROXYCHLOROQUINE SULFATE 200 MG PO TABS: ORAL_TABLET | ORAL | 0 refills | Status: DC

## 2020-01-10 ENCOUNTER — Telehealth: Payer: Self-pay

## 2020-01-10 ENCOUNTER — Ambulatory Visit: Payer: PPO | Admitting: Internal Medicine

## 2020-01-10 NOTE — Telephone Encounter (Signed)
Patient's husband calling and states that the patient is struggling. States that she came down with COVID last week and says that she is coughing and is very weak. Would like to know if there is anything OTC that he can get or if there is a prescription that Dr Alain Marion could send in?

## 2020-01-11 ENCOUNTER — Other Ambulatory Visit: Payer: Self-pay

## 2020-01-11 ENCOUNTER — Emergency Department (HOSPITAL_COMMUNITY): Payer: PPO

## 2020-01-11 ENCOUNTER — Encounter (HOSPITAL_COMMUNITY): Payer: Self-pay | Admitting: *Deleted

## 2020-01-11 ENCOUNTER — Emergency Department (HOSPITAL_COMMUNITY)
Admission: EM | Admit: 2020-01-11 | Discharge: 2020-01-11 | Disposition: A | Discharge status: Home / Self Care | Payer: PPO | Source: Home / Self Care | Attending: Emergency Medicine | Admitting: Emergency Medicine

## 2020-01-11 DIAGNOSIS — R300 Dysuria: Secondary | ICD-10-CM | POA: Diagnosis not present

## 2020-01-11 DIAGNOSIS — I480 Paroxysmal atrial fibrillation: Secondary | ICD-10-CM | POA: Diagnosis present

## 2020-01-11 DIAGNOSIS — R0602 Shortness of breath: Secondary | ICD-10-CM | POA: Diagnosis present

## 2020-01-11 DIAGNOSIS — E860 Dehydration: Secondary | ICD-10-CM | POA: Diagnosis present

## 2020-01-11 DIAGNOSIS — J309 Allergic rhinitis, unspecified: Secondary | ICD-10-CM | POA: Diagnosis present

## 2020-01-11 DIAGNOSIS — Z90710 Acquired absence of both cervix and uterus: Secondary | ICD-10-CM | POA: Diagnosis not present

## 2020-01-11 DIAGNOSIS — U071 COVID-19: Secondary | ICD-10-CM | POA: Diagnosis present

## 2020-01-11 DIAGNOSIS — Z79899 Other long term (current) drug therapy: Secondary | ICD-10-CM | POA: Insufficient documentation

## 2020-01-11 DIAGNOSIS — K219 Gastro-esophageal reflux disease without esophagitis: Secondary | ICD-10-CM | POA: Diagnosis present

## 2020-01-11 DIAGNOSIS — Z8249 Family history of ischemic heart disease and other diseases of the circulatory system: Secondary | ICD-10-CM | POA: Diagnosis not present

## 2020-01-11 DIAGNOSIS — E785 Hyperlipidemia, unspecified: Secondary | ICD-10-CM | POA: Diagnosis present

## 2020-01-11 DIAGNOSIS — I10 Essential (primary) hypertension: Secondary | ICD-10-CM | POA: Diagnosis not present

## 2020-01-11 DIAGNOSIS — R069 Unspecified abnormalities of breathing: Secondary | ICD-10-CM | POA: Diagnosis not present

## 2020-01-11 DIAGNOSIS — E871 Hypo-osmolality and hyponatremia: Secondary | ICD-10-CM | POA: Diagnosis present

## 2020-01-11 DIAGNOSIS — J9601 Acute respiratory failure with hypoxia: Secondary | ICD-10-CM | POA: Diagnosis present

## 2020-01-11 DIAGNOSIS — Z90721 Acquired absence of ovaries, unilateral: Secondary | ICD-10-CM | POA: Diagnosis not present

## 2020-01-11 DIAGNOSIS — Z923 Personal history of irradiation: Secondary | ICD-10-CM | POA: Diagnosis not present

## 2020-01-11 DIAGNOSIS — H3530 Unspecified macular degeneration: Secondary | ICD-10-CM | POA: Diagnosis present

## 2020-01-11 DIAGNOSIS — R531 Weakness: Secondary | ICD-10-CM | POA: Diagnosis not present

## 2020-01-11 DIAGNOSIS — Z8719 Personal history of other diseases of the digestive system: Secondary | ICD-10-CM | POA: Diagnosis not present

## 2020-01-11 DIAGNOSIS — Z86010 Personal history of colonic polyps: Secondary | ICD-10-CM | POA: Diagnosis not present

## 2020-01-11 DIAGNOSIS — G4700 Insomnia, unspecified: Secondary | ICD-10-CM | POA: Diagnosis present

## 2020-01-11 DIAGNOSIS — Z8619 Personal history of other infectious and parasitic diseases: Secondary | ICD-10-CM | POA: Diagnosis not present

## 2020-01-11 DIAGNOSIS — I447 Left bundle-branch block, unspecified: Secondary | ICD-10-CM | POA: Diagnosis not present

## 2020-01-11 DIAGNOSIS — M8580 Other specified disorders of bone density and structure, unspecified site: Secondary | ICD-10-CM | POA: Diagnosis present

## 2020-01-11 DIAGNOSIS — I351 Nonrheumatic aortic (valve) insufficiency: Secondary | ICD-10-CM | POA: Diagnosis present

## 2020-01-11 DIAGNOSIS — Z87891 Personal history of nicotine dependence: Secondary | ICD-10-CM | POA: Diagnosis not present

## 2020-01-11 DIAGNOSIS — J1282 Pneumonia due to coronavirus disease 2019: Secondary | ICD-10-CM | POA: Diagnosis present

## 2020-01-11 DIAGNOSIS — Z853 Personal history of malignant neoplasm of breast: Secondary | ICD-10-CM | POA: Diagnosis not present

## 2020-01-11 DIAGNOSIS — Z7901 Long term (current) use of anticoagulants: Secondary | ICD-10-CM | POA: Insufficient documentation

## 2020-01-11 DIAGNOSIS — J069 Acute upper respiratory infection, unspecified: Secondary | ICD-10-CM | POA: Diagnosis not present

## 2020-01-11 DIAGNOSIS — I454 Nonspecific intraventricular block: Secondary | ICD-10-CM | POA: Diagnosis present

## 2020-01-11 LAB — COMPREHENSIVE METABOLIC PANEL
ALT: 18 U/L (ref 0–44)
AST: 23 U/L (ref 15–41)
Albumin: 3.4 g/dL — ABNORMAL LOW (ref 3.5–5.0)
Alkaline Phosphatase: 86 U/L (ref 38–126)
Anion gap: 10 (ref 5–15)
BUN: 5 mg/dL — ABNORMAL LOW (ref 8–23)
CO2: 25 mmol/L (ref 22–32)
Calcium: 8.6 mg/dL — ABNORMAL LOW (ref 8.9–10.3)
Chloride: 92 mmol/L — ABNORMAL LOW (ref 98–111)
Creatinine, Ser: 0.66 mg/dL (ref 0.44–1.00)
GFR calc Af Amer: >60 mL/min (ref >60)
GFR calc non Af Amer: >60 mL/min (ref >60)
Glucose, Bld: 124 mg/dL — ABNORMAL HIGH (ref 70–99)
Potassium: 3.7 mmol/L (ref 3.5–5.1)
Sodium: 127 mmol/L — ABNORMAL LOW (ref 135–145)
Total Bilirubin: 0.6 mg/dL (ref 0.3–1.2)
Total Protein: 6.4 g/dL — ABNORMAL LOW (ref 6.5–8.1)

## 2020-01-11 LAB — URINALYSIS, ROUTINE W REFLEX MICROSCOPIC
Bilirubin Urine: NEGATIVE
Glucose, UA: NEGATIVE mg/dL
Hgb urine dipstick: NEGATIVE
Ketones, ur: 20 mg/dL — AB
Leukocytes,Ua: NEGATIVE
Nitrite: NEGATIVE
Protein, ur: NEGATIVE mg/dL
Specific Gravity, Urine: 1.006 (ref 1.005–1.030)
pH: 6 (ref 5.0–8.0)

## 2020-01-11 LAB — CBC
HCT: 35.9 % — ABNORMAL LOW (ref 36.0–46.0)
Hemoglobin: 12.3 g/dL (ref 12.0–15.0)
MCH: 31.5 pg (ref 26.0–34.0)
MCHC: 34.3 g/dL (ref 30.0–36.0)
MCV: 91.8 fL (ref 80.0–100.0)
Platelets: 247 10*3/uL (ref 150–400)
RBC: 3.91 MIL/uL (ref 3.87–5.11)
RDW: 11.9 % (ref 11.5–15.5)
WBC: 8.2 10*3/uL (ref 4.0–10.5)
nRBC: 0 % (ref 0.0–0.2)

## 2020-01-11 LAB — LIPASE, BLOOD: Lipase: 26 U/L (ref 11–51)

## 2020-01-11 MED ORDER — ALBUTEROL SULFATE HFA 108 (90 BASE) MCG/ACT IN AERS
2.0000 | INHALATION_SPRAY | Freq: Once | RESPIRATORY_TRACT | Status: AC
  Administered 2020-01-11: 2 via RESPIRATORY_TRACT
  Filled 2020-01-11: qty 6.7

## 2020-01-11 MED ORDER — SODIUM CHLORIDE 0.9 % IV BOLUS: 500.0000 mL | Freq: Once | INTRAVENOUS | Status: DC

## 2020-01-11 MED ORDER — SODIUM CHLORIDE 0.9 % IV BOLUS
1000.0000 mL | Freq: Once | INTRAVENOUS | Status: AC
  Administered 2020-01-11: 1000 mL via INTRAVENOUS

## 2020-01-11 MED ORDER — ONDANSETRON HCL 4 MG/2ML IJ SOLN
4.0000 mg | Freq: Once | INTRAMUSCULAR | Status: AC
  Administered 2020-01-11: 4 mg via INTRAVENOUS
  Filled 2020-01-11: qty 2

## 2020-01-11 MED ORDER — ONDANSETRON HCL 4 MG PO TABS: 4.0000 mg | ORAL_TABLET | Freq: Four times a day (QID) | ORAL | 0 refills | Status: DC

## 2020-01-11 MED ORDER — ACETAMINOPHEN 500 MG PO TABS
1000.0000 mg | ORAL_TABLET | Freq: Once | ORAL | Status: AC
  Administered 2020-01-11: 1000 mg via ORAL
  Filled 2020-01-11: qty 2

## 2020-01-11 MED ORDER — SODIUM CHLORIDE 0.9% FLUSH: 3.0000 mL | Freq: Once | INTRAVENOUS | Status: DC

## 2020-01-11 NOTE — Discharge Instructions (Signed)
You were given a prescription for zofran to help with your nausea. Please take as directed.  Please follow up with your primary care provider within 3-5 days for re-evaluation of your symptoms. If you do not have a primary care provider, information for a healthcare clinic has been provided for you to make arrangements for follow up care. Please return to the emergency department for any new or worsening symptoms.

## 2020-01-11 NOTE — ED Notes (Signed)
Pt has drank 3.5 cups of water so far.

## 2020-01-11 NOTE — ED Notes (Signed)
Roger [phone]) called/would like a call on status, if need to pick up the patient.

## 2020-01-11 NOTE — ED Notes (Signed)
Verbalized understanding of DC instructions, Rx, follow up care. Husband taking pt home.

## 2020-01-11 NOTE — Telephone Encounter (Signed)
She is in the emergency room as I understand.  Thanks

## 2020-01-11 NOTE — ED Triage Notes (Signed)
Patient states she was dx. With  covid 1/28 c/o diarrhea and cough.

## 2020-01-11 NOTE — ED Provider Notes (Signed)
[facility] EMERGENCY DEPARTMENT Provider Note   CSN: [id number] Arrival date & time: 01/11/20  1056     History Chief Complaint  Patient presents with  . Diarrhea  . Cough    Vicki [name] is a 74 y.o. female.  HPI   Pt is a 74 y/o female with a h/o breast CA, aortic insufficiency, diverticulosis, GERD, HLD, osteopenia, afib, who presents to the ED today for evaluation of fatigue, cough, and diarrhea. States she was dx with COVID 9 days ago. She has had productive cough during this time. Denies SOB, pleuritic pain, or chest pain. She has had some DOE. She has been having multiple episodes of diarrhea. States she has had decreased appetite and has not eaten anything in about 1 week. She also had an episode of post tussive emesis today. Denies abd pain, nausea, or continued vomiting. Denies urinary sxs. Has had intermittent fevers.  States that her doctor started her on hydroxychlorquine 4 days ago and she has seen no improvement of sxs.  Past Medical History:  Diagnosis Date  . Adenocarcinoma, breast ([place])   . Allergic rhinitis   . Anxiety   . Aortic insufficiency   . Breast cancer [facility]) 2008   Left Breast Cancer  . Contact dermatitis   . Diverticulosis of colon   . DJD (degenerative joint disease)   . GERD (gastroesophageal reflux disease)   . Hx of colonic polyps   . Hyperlipidemia   . Macular degeneration, bilateral   . Osteopenia   . Paroxysmal atrial fibrillation ([facility])    A. fib is now persistent.  . Personal history of radiation therapy 2008   Left Breast Cancer  . Visit for monitoring Tikosyn therapy 09/06/2015    Patient Active Problem List   Diagnosis Date Noted  . Colon cancer screening 07/11/2019  . Cancer ([place]) 02/05/2019  . Menopausal syndrome 02/05/2019  . Neoplasm of breast 02/05/2019  . Chest wall pain 10/30/2018  . Insomnia 01/09/2018  . Ankle sprain 07/08/2017  . Weight loss 01/06/2017  . Neoplasm of uncertain behavior of  skin 04/16/2016  . Solitary pulmonary nodule 10/06/2015  . Visit for monitoring Tikosyn therapy 09/06/2015  . Epistaxis 08/08/2015  . Cerumen impaction 07/04/2015  . Abdominal bloating 01/03/2015  . Cough 03/20/2014  . Well adult exam 03/19/2014  . Elevated BP 03/19/2014  . Acute URI 02/28/2014  . Shingles rash 01/22/2013  . Routine health maintenance 01/03/2012  . PAROXYSMAL ATRIAL FIBRILLATION 02/10/2011  . RECTAL BLEEDING 08/07/2009  . COLONIC POLYPS, HYPERPLASTIC, HX OF 08/04/2009  . ADENOCARCINOMA, BREAST 09/07/2008  . AORTIC INSUFFICIENCY, MILD 09/07/2008  . DIVERTICULOSIS OF COLON 09/07/2008  . DEGENERATIVE JOINT DISEASE 09/07/2008  . Dyslipidemia 07/04/2008  . Anxiety state 07/04/2008  . ALLERGIC RHINITIS 07/04/2008  . GERD 07/04/2008  . CONTACT DERMATITIS 07/04/2008  . Osteopenia 07/04/2008    Past Surgical History:  Procedure Laterality Date  . ABDOMINAL HYSTERECTOMY  1984   with 1 ovary removal  . BREAST LUMPECTOMY Left 08/2007    with node bx  . ELECTROPHYSIOLOGIC STUDY N/A 05/07/2016   Procedure: Atrial Fibrillation Ablation;  Surgeon: Will Meredith Leeds, MD;  Location: [facility];  Service: Cardiovascular;  Laterality: N/A;     OB History   No obstetric history on file.     Family History  Problem Relation Age of Onset  . Heart disease Father 43  . Heart attack Father   . Other Mother 43  AVR  . Heart disease Mother   . Clotting disorder Mother 54  . Breast cancer Sister   . Lymphoma Sister   . Hypertension Sister   . Healthy Brother   . Other Maternal Grandmother   . Colon cancer Neg Hx   . Esophageal cancer Neg Hx   . Pancreatic cancer Neg Hx     Social History   Tobacco Use  . Smoking status: Former Smoker    Types: Cigarettes    Quit date: 12/06/1976    Years since quitting: 43.1  . Smokeless tobacco: Never Used  . Tobacco comment: smoked approx 10 years  Substance Use Topics  . Alcohol use: No    Alcohol/week: 0.0  standard drinks  . Drug use: No    Home Medications Prior to Admission medications   Medication Sig Start Date End Date Taking? Authorizing Provider  acetaminophen (TYLENOL) 325 MG tablet Take 500 mg by mouth every 6 (six) hours as needed.    [provider]  cholecalciferol (VITAMIN D) 1000 UNITS tablet Take 1,000 Units by mouth 2 (two) times daily.    [provider]  hydroxychloroquine (PLAQUENIL) 200 MG tablet Take 400 mg bid x 2 days, then 200 mg daily for 5 dais if fever, cough, shortness of breath 01/05/20   Plotnikov, Evie Lacks, MD  hydroxypropyl methylcellulose / hypromellose (ISOPTO TEARS / GONIOVISC) 2.5 % ophthalmic solution Place 1 drop into both eyes as needed for dry eyes.    [provider]  loratadine (CLARITIN) 10 MG tablet Take 10 mg by mouth daily.    [provider]  Melatonin 3 MG TABS Take 3 mg by mouth at bedtime as needed.     [provider]  Multiple Vitamins-Minerals (ICAPS) TABS Take 1 tablet by mouth 2 (two) times daily.     [provider]  ondansetron (ZOFRAN) 4 MG tablet Take 1 tablet (4 mg total) by mouth every 6 (six) hours. 01/11/20   Daric Koren S, PA-C  PEPCID AC MAXIMUM STRENGTH 20 MG tablet Take 1 tablet by mouth once daily 04/16/19   Lorretta Harp, MD  simvastatin (ZOCOR) 20 MG tablet TAKE 1 TABLET BY MOUTH AT BEDTIME 11/15/19   Plotnikov, Evie Lacks, MD  triamcinolone ointment (KENALOG) 0.5 % Apply 1 application topically as needed.    [provider]  trimethoprim-polymyxin b (POLYTRIM) ophthalmic solution Place 1 drop into the left eye See admin instructions. After eye injection, every 5 weeks, 1 drop 4 times daily in Left eye for 2 days    [provider]  XARELTO 20 MG TABS tablet Take 1 tablet by mouth once daily 08/23/19   Lorretta Harp, MD    Allergies    Codeine  Review of Systems   Review of Systems  Constitutional: Positive for appetite change, fatigue and  fever.  HENT: Negative for ear pain and sore throat.   Eyes: Negative for pain and visual disturbance.  Respiratory: Positive for cough and shortness of breath.   Cardiovascular: Negative for chest pain, palpitations and leg swelling.  Gastrointestinal: Positive for diarrhea, nausea and vomiting. Negative for abdominal pain.  Genitourinary: Negative for dysuria and hematuria.  Musculoskeletal: Negative for back pain.  Skin: Negative for color change and rash.  Neurological: Negative for headaches.  All other systems reviewed and are negative.   Physical Exam Updated Vital Signs BP (!) 168/86   Pulse 95   Temp 98.4 F (36.9 C) (Oral)   Resp (!) 26  Ht 5\' 3"  (1.6 m)   Wt 52.2 kg   SpO2 95%   BMI 20.37 kg/m   Physical Exam Vitals and nursing note reviewed.  Constitutional:      General: She is not in acute distress.    Appearance: She is well-developed.  HENT:     Head: Normocephalic and atraumatic.     Mouth/Throat:     Mouth: Mucous membranes are dry.  Eyes:     Conjunctiva/sclera: Conjunctivae normal.  Cardiovascular:     Rate and Rhythm: Normal rate and regular rhythm.     Pulses: Normal pulses.     Heart sounds: Normal heart sounds. No murmur.  Pulmonary:     Effort: Pulmonary effort is normal. No respiratory distress.     Breath sounds: Normal breath sounds. No wheezing, rhonchi or rales.  Abdominal:     General: Bowel sounds are normal. There is no distension.     Palpations: Abdomen is soft.     Tenderness: There is no abdominal tenderness. There is no guarding or rebound.  Musculoskeletal:     Cervical back: Neck supple.  Skin:    General: Skin is warm and dry.  Neurological:     Mental Status: She is alert.     ED Results / Procedures / Treatments   Labs (all labs ordered are listed, but only abnormal results are displayed) Labs Reviewed  COMPREHENSIVE METABOLIC PANEL - Abnormal; Notable for the following components:      Result Value   Sodium  127 (*)    Chloride 92 (*)    Glucose, Bld 124 (*)    BUN 5 (*)    Calcium 8.6 (*)    Total Protein 6.4 (*)    Albumin 3.4 (*)    All other components within normal limits  CBC - Abnormal; Notable for the following components:   HCT 35.9 (*)    All other components within normal limits  URINALYSIS, ROUTINE W REFLEX MICROSCOPIC - Abnormal; Notable for the following components:   Ketones, ur 20 (*)    All other components within normal limits  LIPASE, BLOOD    EKG EKG Interpretation  Date/Time:  Friday January 11 2020 13:34:32 EST Ventricular Rate:  79 PR Interval:    QRS Duration: 152 QT Interval:  426 QTC Calculation: 489 R Axis:   -146 Text Interpretation: Sinus rhythm new Nonspecific intraventricular conduction delay Consider anterior infarct Minimal ST depression, lateral leads Confirmed by Blanchie Dessert [id number]) on 01/11/2020 2:44:09 PM   Radiology DG Chest Portable 1 View  Result Date: 01/11/2020 CLINICAL DATA:  Shortness of breath and EXAM: PORTABLE CHEST 1 VIEW COMPARISON:  October 30, 2018 FINDINGS: The lungs are hyperinflated. Mild, stable chronic appearing increased interstitial lung markings are noted. There is no evidence of acute infiltrate, pleural effusion or pneumothorax. The heart size and mediastinal contours are within normal limits. The visualized skeletal structures are unremarkable. IMPRESSION: Stable exam without evidence of acute or active cardiopulmonary disease. Electronically Signed   By: Virgina Norfolk M.D.   On: 01/11/2020 15:09    Procedures Procedures (including critical care time)  Medications Ordered in ED Medications  sodium chloride flush (NS) 0.9 % injection 3 mL (0 mLs Intravenous Hold 01/11/20 1301)  acetaminophen (TYLENOL) tablet 1,000 mg (1,000 mg Oral Given 01/11/20 1424)  albuterol (VENTOLIN HFA) 108 (90 Base) MCG/ACT inhaler 2 puff (2 puffs Inhalation Given 01/11/20 1436)  sodium chloride 0.9 % bolus 1,000 mL (0 mLs Intravenous  Stopped 01/11/20 1559)  ondansetron (ZOFRAN) injection 4 mg (4 mg Intravenous Given 01/11/20 1559)    ED Course  I have reviewed the triage vital signs and the nursing notes.  Pertinent labs & imaging results that were available during my care of the patient were reviewed by me and considered in my medical decision making (see chart for details).    MDM Rules/Calculators/A&P                      74 year old female presenting for evaluation of fatigue, cough, dyspnea on exertion, diarrhea.  She is not having any chest pain or abdominal pain.  She is on day 9 of being diagnosed with Covid.  On eval she is somewhat tachypneic.  She is not hypoxic.  She does not have any tachycardia or hypotension.  I ambulated her about the room and sats did not drop below 94% with ambulation.  She did not appear to be over fully dyspneic and she did not complain of any significant dyspnea with walking in the room.  I did recheck her temperature and it was 100 point 36F orally.  Labs were reviewed CBC without leukocytosis or anemia CMP with hyponatremia, hyper hypochloremia.  BUN/creatinine are normal.  Liver enzymes are normal. Lipase is negative UA w/o evidence for infection.   EKG Sinus rhythm new Nonspecific intraventricular conduction delay Consider anterior infarct Minimal ST depression, lateral leads  Chest x-ray does not show any evidence of pneumonia, pneumothorax or other acute abnormality.  Patient appears dehydrated clinically and lab findings are also suggestive of dehydration.  She was given IV fluids as well as oral hydration.  She was able to tolerate several cups of water in the ED.  She was also given an albuterol inhaler.  On reassessment she states she feels improved after fluids and the inhaler.  She is sitting in bed in no acute distress.  We discussed plan for discharge with symptomatic management and close follow-up with her PCP.  She states she has a pulse ox at home and will monitor her  oxygen.  Advised on return precautions.  She voiced understanding the reasons to return.  All questions answered patient still for discharge.  --  Vicki [name] was evaluated in Emergency Department on 01/11/2020 for the symptoms described in the history of present illness. She was evaluated in the context of the global COVID-19 pandemic, which necessitated consideration that the patient might be at risk for infection with the SARS-CoV-2 virus that causes COVID-19. Institutional protocols and algorithms that pertain to the evaluation of patients at risk for COVID-19 are in a state of rapid change based on information released by regulatory bodies including the CDC and federal and state organizations. These policies and algorithms were followed during the patient's care in the ED.   Final Clinical Impression(s) / ED Diagnoses Final diagnoses:  COVID-19  Dehydration    Rx / DC Orders ED Discharge Orders         Ordered    ondansetron (ZOFRAN) 4 MG tablet  Every 6 hours     01/11/20 1637           Denaly Gatling S, PA-C 01/11/20 1639    Blanchie Dessert, MD 01/14/20 [phone]

## 2020-01-14 ENCOUNTER — Emergency Department (HOSPITAL_COMMUNITY): Payer: PPO

## 2020-01-14 ENCOUNTER — Telehealth: Payer: Self-pay

## 2020-01-14 ENCOUNTER — Other Ambulatory Visit: Payer: Self-pay

## 2020-01-14 ENCOUNTER — Inpatient Hospital Stay (HOSPITAL_COMMUNITY)
Admission: EM | Admit: 2020-01-14 | Discharge: 2020-01-18 | DRG: 177 | Disposition: A | Discharge status: Home / Self Care | Payer: PPO | Attending: Internal Medicine | Admitting: Internal Medicine

## 2020-01-14 ENCOUNTER — Encounter (HOSPITAL_COMMUNITY): Payer: Self-pay

## 2020-01-14 DIAGNOSIS — R300 Dysuria: Secondary | ICD-10-CM | POA: Diagnosis not present

## 2020-01-14 DIAGNOSIS — J1282 Pneumonia due to coronavirus disease 2019: Secondary | ICD-10-CM | POA: Diagnosis present

## 2020-01-14 DIAGNOSIS — E860 Dehydration: Secondary | ICD-10-CM | POA: Diagnosis present

## 2020-01-14 DIAGNOSIS — J9601 Acute respiratory failure with hypoxia: Secondary | ICD-10-CM | POA: Diagnosis present

## 2020-01-14 DIAGNOSIS — E785 Hyperlipidemia, unspecified: Secondary | ICD-10-CM | POA: Diagnosis present

## 2020-01-14 DIAGNOSIS — Z79899 Other long term (current) drug therapy: Secondary | ICD-10-CM

## 2020-01-14 DIAGNOSIS — Z807 Family history of other malignant neoplasms of lymphoid, hematopoietic and related tissues: Secondary | ICD-10-CM

## 2020-01-14 DIAGNOSIS — J309 Allergic rhinitis, unspecified: Secondary | ICD-10-CM | POA: Diagnosis present

## 2020-01-14 DIAGNOSIS — Z832 Family history of diseases of the blood and blood-forming organs and certain disorders involving the immune mechanism: Secondary | ICD-10-CM

## 2020-01-14 DIAGNOSIS — Z87891 Personal history of nicotine dependence: Secondary | ICD-10-CM | POA: Diagnosis not present

## 2020-01-14 DIAGNOSIS — I454 Nonspecific intraventricular block: Secondary | ICD-10-CM | POA: Diagnosis present

## 2020-01-14 DIAGNOSIS — U071 COVID-19: Secondary | ICD-10-CM | POA: Diagnosis present

## 2020-01-14 DIAGNOSIS — H3530 Unspecified macular degeneration: Secondary | ICD-10-CM | POA: Diagnosis present

## 2020-01-14 DIAGNOSIS — Z90710 Acquired absence of both cervix and uterus: Secondary | ICD-10-CM

## 2020-01-14 DIAGNOSIS — Z86010 Personal history of colonic polyps: Secondary | ICD-10-CM | POA: Diagnosis not present

## 2020-01-14 DIAGNOSIS — E871 Hypo-osmolality and hyponatremia: Secondary | ICD-10-CM | POA: Diagnosis present

## 2020-01-14 DIAGNOSIS — Z8619 Personal history of other infectious and parasitic diseases: Secondary | ICD-10-CM

## 2020-01-14 DIAGNOSIS — I4891 Unspecified atrial fibrillation: Secondary | ICD-10-CM | POA: Diagnosis present

## 2020-01-14 DIAGNOSIS — K219 Gastro-esophageal reflux disease without esophagitis: Secondary | ICD-10-CM | POA: Diagnosis present

## 2020-01-14 DIAGNOSIS — Z8719 Personal history of other diseases of the digestive system: Secondary | ICD-10-CM

## 2020-01-14 DIAGNOSIS — Z923 Personal history of irradiation: Secondary | ICD-10-CM | POA: Diagnosis not present

## 2020-01-14 DIAGNOSIS — Z7952 Long term (current) use of systemic steroids: Secondary | ICD-10-CM

## 2020-01-14 DIAGNOSIS — Z79890 Hormone replacement therapy (postmenopausal): Secondary | ICD-10-CM

## 2020-01-14 DIAGNOSIS — J069 Acute upper respiratory infection, unspecified: Secondary | ICD-10-CM | POA: Diagnosis present

## 2020-01-14 DIAGNOSIS — Z8249 Family history of ischemic heart disease and other diseases of the circulatory system: Secondary | ICD-10-CM | POA: Diagnosis not present

## 2020-01-14 DIAGNOSIS — G4700 Insomnia, unspecified: Secondary | ICD-10-CM | POA: Diagnosis present

## 2020-01-14 DIAGNOSIS — Z853 Personal history of malignant neoplasm of breast: Secondary | ICD-10-CM | POA: Diagnosis not present

## 2020-01-14 DIAGNOSIS — I351 Nonrheumatic aortic (valve) insufficiency: Secondary | ICD-10-CM | POA: Diagnosis present

## 2020-01-14 DIAGNOSIS — I480 Paroxysmal atrial fibrillation: Secondary | ICD-10-CM | POA: Diagnosis present

## 2020-01-14 DIAGNOSIS — Z90721 Acquired absence of ovaries, unilateral: Secondary | ICD-10-CM | POA: Diagnosis not present

## 2020-01-14 DIAGNOSIS — R0602 Shortness of breath: Secondary | ICD-10-CM | POA: Diagnosis present

## 2020-01-14 DIAGNOSIS — M8580 Other specified disorders of bone density and structure, unspecified site: Secondary | ICD-10-CM | POA: Diagnosis present

## 2020-01-14 DIAGNOSIS — Z7901 Long term (current) use of anticoagulants: Secondary | ICD-10-CM

## 2020-01-14 DIAGNOSIS — Z803 Family history of malignant neoplasm of breast: Secondary | ICD-10-CM

## 2020-01-14 DIAGNOSIS — Z885 Allergy status to narcotic agent status: Secondary | ICD-10-CM

## 2020-01-14 HISTORY — DX: Cardiac arrhythmia, unspecified: I49.9

## 2020-01-14 LAB — COMPREHENSIVE METABOLIC PANEL
ALT: 28 U/L (ref 0–44)
AST: 37 U/L (ref 15–41)
Albumin: 2.6 g/dL — ABNORMAL LOW (ref 3.5–5.0)
Alkaline Phosphatase: 75 U/L (ref 38–126)
Anion gap: 11 (ref 5–15)
BUN: 5 mg/dL — ABNORMAL LOW (ref 8–23)
CO2: 23 mmol/L (ref 22–32)
Calcium: 8 mg/dL — ABNORMAL LOW (ref 8.9–10.3)
Chloride: 95 mmol/L — ABNORMAL LOW (ref 98–111)
Creatinine, Ser: 0.55 mg/dL (ref 0.44–1.00)
GFR calc Af Amer: >60 mL/min (ref >60)
GFR calc non Af Amer: >60 mL/min (ref >60)
Glucose, Bld: 125 mg/dL — ABNORMAL HIGH (ref 70–99)
Potassium: 3.7 mmol/L (ref 3.5–5.1)
Sodium: 129 mmol/L — ABNORMAL LOW (ref 135–145)
Total Bilirubin: 0.4 mg/dL (ref 0.3–1.2)
Total Protein: 5.9 g/dL — ABNORMAL LOW (ref 6.5–8.1)

## 2020-01-14 LAB — CBC WITH DIFFERENTIAL/PLATELET
Abs Immature Granulocytes: 0.05 10*3/uL (ref 0.00–0.07)
Basophils Absolute: 0 10*3/uL (ref 0.0–0.1)
Basophils Relative: 0 %
Eosinophils Absolute: 0 10*3/uL (ref 0.0–0.5)
Eosinophils Relative: 0 %
HCT: 33.3 % — ABNORMAL LOW (ref 36.0–46.0)
Hemoglobin: 11.4 g/dL — ABNORMAL LOW (ref 12.0–15.0)
Immature Granulocytes: 1 %
Lymphocytes Relative: 6 %
Lymphs Abs: 0.5 10*3/uL — ABNORMAL LOW (ref 0.7–4.0)
MCH: 31.3 pg (ref 26.0–34.0)
MCHC: 34.2 g/dL (ref 30.0–36.0)
MCV: 91.5 fL (ref 80.0–100.0)
Monocytes Absolute: 0.7 10*3/uL (ref 0.1–1.0)
Monocytes Relative: 9 %
Neutro Abs: 6.8 10*3/uL (ref 1.7–7.7)
Neutrophils Relative %: 84 %
Platelets: 257 10*3/uL (ref 150–400)
RBC: 3.64 MIL/uL — ABNORMAL LOW (ref 3.87–5.11)
RDW: 12.1 % (ref 11.5–15.5)
WBC: 8.1 10*3/uL (ref 4.0–10.5)
nRBC: 0 % (ref 0.0–0.2)

## 2020-01-14 LAB — LACTIC ACID, PLASMA: Lactic Acid, Venous: 1.4 mmol/L (ref 0.5–1.9)

## 2020-01-14 LAB — RESPIRATORY PANEL BY RT PCR (FLU A&B, COVID)
Influenza A by PCR: NEGATIVE
Influenza B by PCR: NEGATIVE
SARS Coronavirus 2 by RT PCR: POSITIVE — AB

## 2020-01-14 LAB — PROCALCITONIN: Procalcitonin: 0.57 ng/mL

## 2020-01-14 LAB — FERRITIN: Ferritin: 199 ng/mL (ref 11–307)

## 2020-01-14 LAB — TRIGLYCERIDES: Triglycerides: 71 mg/dL (ref <150)

## 2020-01-14 LAB — POC SARS CORONAVIRUS 2 AG -  ED: SARS Coronavirus 2 Ag: NEGATIVE

## 2020-01-14 LAB — BRAIN NATRIURETIC PEPTIDE: B Natriuretic Peptide: 357.4 pg/mL — ABNORMAL HIGH (ref 0.0–100.0)

## 2020-01-14 LAB — LACTATE DEHYDROGENASE: LDH: 286 U/L — ABNORMAL HIGH (ref 98–192)

## 2020-01-14 LAB — D-DIMER, QUANTITATIVE: D-Dimer, Quant: 1.47 ug/mL-FEU — ABNORMAL HIGH (ref 0.00–0.50)

## 2020-01-14 LAB — FIBRINOGEN: Fibrinogen: 655 mg/dL — ABNORMAL HIGH (ref 210–475)

## 2020-01-14 LAB — TROPONIN I (HIGH SENSITIVITY)
Troponin I (High Sensitivity): 7 ng/L (ref <18)
Troponin I (High Sensitivity): 7 ng/L (ref <18)

## 2020-01-14 LAB — C-REACTIVE PROTEIN: CRP: 16.9 mg/dL — ABNORMAL HIGH (ref <1.0)

## 2020-01-14 MED ORDER — SENNOSIDES-DOCUSATE SODIUM 8.6-50 MG PO TABS: 2.0000 | ORAL_TABLET | Freq: Every evening | ORAL | Status: DC | PRN

## 2020-01-14 MED ORDER — FLEET ENEMA 7-19 GM/118ML RE ENEM: 1.0000 | ENEMA | Freq: Once | RECTAL | Status: DC | PRN

## 2020-01-14 MED ORDER — ICAPS AREDS FORMULA PO TABS: 1.0000 | ORAL_TABLET | Freq: Two times a day (BID) | ORAL | Status: DC

## 2020-01-14 MED ORDER — SODIUM CHLORIDE 0.9 % IV SOLN
200.0000 mg | Freq: Once | INTRAVENOUS | Status: AC
  Administered 2020-01-14: 200 mg via INTRAVENOUS
  Filled 2020-01-14: qty 200

## 2020-01-14 MED ORDER — SODIUM CHLORIDE 0.9% FLUSH: 3.0000 mL | INTRAVENOUS | Status: DC | PRN

## 2020-01-14 MED ORDER — SODIUM CHLORIDE 0.9% FLUSH
3.0000 mL | Freq: Two times a day (BID) | INTRAVENOUS | Status: DC
  Administered 2020-01-14 – 2020-01-18 (×9): 3 mL via INTRAVENOUS

## 2020-01-14 MED ORDER — BISACODYL 5 MG PO TBEC: 5.0000 mg | DELAYED_RELEASE_TABLET | Freq: Every day | ORAL | Status: DC | PRN

## 2020-01-14 MED ORDER — DEXAMETHASONE SODIUM PHOSPHATE 10 MG/ML IJ SOLN
6.0000 mg | Freq: Once | INTRAMUSCULAR | Status: AC
  Administered 2020-01-14: 6 mg via INTRAVENOUS
  Filled 2020-01-14: qty 1

## 2020-01-14 MED ORDER — OXYCODONE HCL 5 MG PO TABS: 5.0000 mg | ORAL_TABLET | ORAL | Status: DC | PRN

## 2020-01-14 MED ORDER — LORATADINE 10 MG PO TABS
10.0000 mg | ORAL_TABLET | Freq: Every day | ORAL | Status: DC
  Administered 2020-01-14 – 2020-01-18 (×5): 10 mg via ORAL
  Filled 2020-01-14 (×5): qty 1

## 2020-01-14 MED ORDER — ZINC SULFATE 220 (50 ZN) MG PO CAPS
220.0000 mg | ORAL_CAPSULE | Freq: Every day | ORAL | Status: DC
  Administered 2020-01-14 – 2020-01-18 (×5): 220 mg via ORAL
  Filled 2020-01-14 (×5): qty 1

## 2020-01-14 MED ORDER — FAMOTIDINE 20 MG PO TABS
10.0000 mg | ORAL_TABLET | Freq: Two times a day (BID) | ORAL | Status: DC
  Administered 2020-01-14 – 2020-01-18 (×8): 10 mg via ORAL
  Filled 2020-01-14 (×8): qty 1

## 2020-01-14 MED ORDER — SODIUM CHLORIDE 0.9% FLUSH
3.0000 mL | Freq: Two times a day (BID) | INTRAVENOUS | Status: DC
  Administered 2020-01-14 – 2020-01-18 (×8): 3 mL via INTRAVENOUS

## 2020-01-14 MED ORDER — RIVAROXABAN 20 MG PO TABS
20.0000 mg | ORAL_TABLET | Freq: Every day | ORAL | Status: DC
  Administered 2020-01-14 – 2020-01-17 (×4): 20 mg via ORAL
  Filled 2020-01-14 (×7): qty 1

## 2020-01-14 MED ORDER — ACETAMINOPHEN 500 MG PO TABS
1000.0000 mg | ORAL_TABLET | Freq: Once | ORAL | Status: AC
  Administered 2020-01-14: 1000 mg via ORAL
  Filled 2020-01-14: qty 2

## 2020-01-14 MED ORDER — ASCORBIC ACID 500 MG PO TABS
500.0000 mg | ORAL_TABLET | Freq: Every day | ORAL | Status: DC
  Administered 2020-01-14 – 2020-01-18 (×5): 500 mg via ORAL
  Filled 2020-01-14 (×5): qty 1

## 2020-01-14 MED ORDER — SODIUM CHLORIDE 0.9 % IV SOLN
100.0000 mg | Freq: Every day | INTRAVENOUS | Status: AC
  Administered 2020-01-15 – 2020-01-18 (×4): 100 mg via INTRAVENOUS
  Filled 2020-01-14 (×4): qty 20

## 2020-01-14 MED ORDER — ENSURE ENLIVE PO LIQD: 237.0000 mL | Freq: Two times a day (BID) | ORAL | Status: DC

## 2020-01-14 MED ORDER — HYDROCOD POLST-CPM POLST ER 10-8 MG/5ML PO SUER: 5.0000 mL | Freq: Two times a day (BID) | ORAL | Status: DC | PRN

## 2020-01-14 MED ORDER — ACETAMINOPHEN 325 MG PO TABS: 650.0000 mg | ORAL_TABLET | Freq: Four times a day (QID) | ORAL | Status: DC | PRN

## 2020-01-14 MED ORDER — PROSIGHT PO TABS
1.0000 | ORAL_TABLET | Freq: Two times a day (BID) | ORAL | Status: DC
  Administered 2020-01-14 – 2020-01-18 (×8): 1 via ORAL
  Filled 2020-01-14 (×9): qty 1

## 2020-01-14 MED ORDER — SIMVASTATIN 20 MG PO TABS
20.0000 mg | ORAL_TABLET | Freq: Every day | ORAL | Status: DC
  Administered 2020-01-14 – 2020-01-17 (×4): 20 mg via ORAL
  Filled 2020-01-14 (×4): qty 1

## 2020-01-14 MED ORDER — ONDANSETRON HCL 4 MG PO TABS: 4.0000 mg | ORAL_TABLET | Freq: Four times a day (QID) | ORAL | Status: DC | PRN

## 2020-01-14 MED ORDER — ALBUTEROL SULFATE HFA 108 (90 BASE) MCG/ACT IN AERS
2.0000 | INHALATION_SPRAY | RESPIRATORY_TRACT | Status: DC | PRN
  Filled 2020-01-14: qty 6.7

## 2020-01-14 MED ORDER — DEXAMETHASONE SODIUM PHOSPHATE 10 MG/ML IJ SOLN
6.0000 mg | INTRAMUSCULAR | Status: DC
  Administered 2020-01-15 – 2020-01-18 (×4): 6 mg via INTRAVENOUS
  Filled 2020-01-14 (×4): qty 1

## 2020-01-14 MED ORDER — IPRATROPIUM-ALBUTEROL 20-100 MCG/ACT IN AERS
1.0000 | INHALATION_SPRAY | Freq: Four times a day (QID) | RESPIRATORY_TRACT | Status: DC
  Administered 2020-01-14 – 2020-01-18 (×13): 1 via RESPIRATORY_TRACT
  Filled 2020-01-14: qty 4

## 2020-01-14 MED ORDER — MELATONIN 3 MG PO TABS
3.0000 mg | ORAL_TABLET | Freq: Every evening | ORAL | Status: DC | PRN
  Administered 2020-01-14 – 2020-01-17 (×4): 3 mg via ORAL
  Filled 2020-01-14 (×6): qty 1

## 2020-01-14 MED ORDER — POLYETHYLENE GLYCOL 3350 17 G PO PACK: 17.0000 g | PACK | Freq: Every day | ORAL | Status: DC | PRN

## 2020-01-14 MED ORDER — ONDANSETRON HCL 4 MG/2ML IJ SOLN: 4.0000 mg | Freq: Four times a day (QID) | INTRAMUSCULAR | Status: DC | PRN

## 2020-01-14 MED ORDER — SODIUM CHLORIDE 0.9 % IV SOLN: 250.0000 mL | INTRAVENOUS | Status: DC | PRN

## 2020-01-14 MED ORDER — GUAIFENESIN-DM 100-10 MG/5ML PO SYRP: 10.0000 mL | ORAL_SOLUTION | ORAL | Status: DC | PRN

## 2020-01-14 NOTE — ED Triage Notes (Addendum)
Pt brought in by EMS with c/o SOB, generalized weakness, and congestions/cough. Diagnosed with Covid on 1/29.  Pt states she checked her SPO2 tonight and it was 88% RA. EMS states it was 91% when they got to pt. Placed pt on 4L [place] with improved sats to 100%.  Pt denies N/V/D. Denies CP. Pt is A&OX4. Speaking in full sentences. Breathing easy, non-labored. Pt has hx of Afib on xarelto.  PIV initiated by EMS, 20G to R hand. IV flushes with 10cc NS without s/s of infiltration. Secured with tape and tegaderm

## 2020-01-14 NOTE — ED Notes (Signed)
Care endorsed to Leslie, RN 

## 2020-01-14 NOTE — ED Provider Notes (Signed)
TIME SEEN: 5:03 AM  CHIEF COMPLAINT: Shortness of breath, Covid positive  HPI: Patient is a 74 year old female with history of A. fib on Xarelto, hyperlipidemia, history of previous breast cancer who presents to the emergency department with increasing shortness of breath, cough and fever.  Diagnosed with COVID-19 on 01/03/2020.  Sats of 88% on room air at home.  Placed on 4 L with EMS.  When taken off of oxygen here she quickly dropped to 89% on room air at rest.  Placed on 2 L currently.  Does not wear oxygen at home.  Denies chest pain.  No abdominal pain.  No vomiting or diarrhea.  Was seen in the ED 3 days ago for the same.  Has been on hydroxychloroquine without relief.  PCP - Dr. Alain Marion with [facility]  ROS: See HPI Constitutional:  fever  Eyes: no drainage  ENT: no runny nose   Cardiovascular:  no chest pain  Resp:  SOB  GI: no vomiting GU: no dysuria Integumentary: no rash  Allergy: no hives  Musculoskeletal: no leg swelling  Neurological: no slurred speech ROS otherwise negative  PAST MEDICAL HISTORY/PAST SURGICAL HISTORY:  Past Medical History:  Diagnosis Date  . Adenocarcinoma, breast ([place])   . Allergic rhinitis   . Anxiety   . Aortic insufficiency   . Breast cancer [facility]) 2008   Left Breast Cancer  . Contact dermatitis   . Diverticulosis of colon   . DJD (degenerative joint disease)   . GERD (gastroesophageal reflux disease)   . Hx of colonic polyps   . Hyperlipidemia   . Macular degeneration, bilateral   . Osteopenia   . Paroxysmal atrial fibrillation (HCC)    A. fib is now persistent.  . Personal history of radiation therapy 2008   Left Breast Cancer  . Visit for monitoring Tikosyn therapy 09/06/2015    MEDICATIONS:  Prior to Admission medications   Medication Sig Start Date End Date Taking? Authorizing Provider  acetaminophen (TYLENOL) 325 MG tablet Take 500 mg by mouth every 6 (six) hours as needed.    [provider]  cholecalciferol (VITAMIN  D) 1000 UNITS tablet Take 1,000 Units by mouth 2 (two) times daily.    [provider]  hydroxychloroquine (PLAQUENIL) 200 MG tablet Take 400 mg bid x 2 days, then 200 mg daily for 5 dais if fever, cough, shortness of breath 01/05/20   Plotnikov, Evie Lacks, MD  hydroxypropyl methylcellulose / hypromellose (ISOPTO TEARS / GONIOVISC) 2.5 % ophthalmic solution Place 1 drop into both eyes as needed for dry eyes.    [provider]  loratadine (CLARITIN) 10 MG tablet Take 10 mg by mouth daily.    [provider]  Melatonin 3 MG TABS Take 3 mg by mouth at bedtime as needed.     [provider]  Multiple Vitamins-Minerals (ICAPS) TABS Take 1 tablet by mouth 2 (two) times daily.     [provider]  ondansetron (ZOFRAN) 4 MG tablet Take 1 tablet (4 mg total) by mouth every 6 (six) hours. 01/11/20   Couture, Cortni S, PA-C  PEPCID AC MAXIMUM STRENGTH 20 MG tablet Take 1 tablet by mouth once daily 04/16/19   Lorretta Harp, MD  simvastatin (ZOCOR) 20 MG tablet TAKE 1 TABLET BY MOUTH AT BEDTIME 11/15/19   Plotnikov, Evie Lacks, MD  triamcinolone ointment (KENALOG) 0.5 % Apply 1 application topically as needed.    [provider]  trimethoprim-polymyxin b (POLYTRIM) ophthalmic solution Place 1 drop into  the left eye See admin instructions. After eye injection, every 5 weeks, 1 drop 4 times daily in Left eye for 2 days    [provider]  XARELTO 20 MG TABS tablet Take 1 tablet by mouth once daily 08/23/19   Lorretta Harp, MD    ALLERGIES:  Allergies  Allergen Reactions  . Codeine     REACTION: unknown    SOCIAL HISTORY:  Social History   Tobacco Use  . Smoking status: Former Smoker    Types: Cigarettes    Quit date: 12/06/1976    Years since quitting: 43.1  . Smokeless tobacco: Never Used  . Tobacco comment: smoked approx 10 years  Substance Use Topics  . Alcohol use: No    Alcohol/week: 0.0 standard drinks    FAMILY  HISTORY: Family History  Problem Relation Age of Onset  . Heart disease Father 21  . Heart attack Father   . Other Mother 84       AVR  . Heart disease Mother   . Clotting disorder Mother 16  . Breast cancer Sister   . Lymphoma Sister   . Hypertension Sister   . Healthy Brother   . Other Maternal Grandmother   . Colon cancer Neg Hx   . Esophageal cancer Neg Hx   . Pancreatic cancer Neg Hx     EXAM: BP (!) 156/75   Pulse 95   Temp (!) 101.2 F (38.4 C) (Oral)   Resp (!) 25   Ht 5\' 3"  (1.6 m)   Wt 52.2 kg   SpO2 93%   BMI 20.37 kg/m  CONSTITUTIONAL: Alert and oriented and responds appropriately to questions. Well-appearing; well-nourished, elderly, in no distress, febrile but nontoxic-appearing HEAD: Normocephalic EYES: Conjunctivae clear, pupils appear equal, EOM appear intact ENT: normal nose; moist mucous membranes NECK: Supple, normal ROM CARD: RRR; S1 and S2 appreciated; no murmurs, no clicks, no rubs, no gallops RESP: Patient is tachypneic in the upper 20s to low 30s, breath sounds clear and equal bilaterally; no wheezes, no rhonchi, no rales, patient has hypoxic on room air at rest, no increased work of breathing or respiratory distress, able to speak full sentences ABD/GI: Normal bowel sounds; non-distended; soft, non-tender, no rebound, no guarding, no peritoneal signs, no hepatosplenomegaly BACK:  The back appears normal EXT: Normal ROM in all joints; no deformity noted, no edema; no cyanosis SKIN: Normal color for age and race; warm; no rash on exposed skin NEURO: Moves all extremities equally PSYCH: The patient's mood and manner are appropriate.   MEDICAL DECISION MAKING: Patient here with hypoxia secondary to COVID-19.  Will obtain labs, chest x-ray.  Patient does not wear oxygen at home.  Anticipate admission.  Patient comfortable with this plan.  ED PROGRESS: Labs show elevation of some of her inflammatory markers.  Normal lactate.  Chest x-ray shows  progression of her COVID-19 pneumonia.  Will need admission to [facility].  She has been updated with plan.   6:48 AM Discussed patient's case with hospitalist, Dr. Alcario Drought.  I have recommended admission and patient (and family if present) agree with this plan. Admitting physician will place admission orders.   I reviewed all nursing notes, vitals, pertinent previous records and interpreted all EKGs, lab and urine results, imaging (as available).  I will order for pharmacy to dose remdesivir and give her 6 mg of IV Decadron now.   EKG Interpretation  Date/Time:  Monday January 14 2020 04:44:16 EST Ventricular Rate:  95  PR Interval:    QRS Duration: 143 QT Interval:  375 QTC Calculation: 472 R Axis:   -150 Text Interpretation: Sinus rhythm Nonspecific intraventricular conduction delay Anterior infarct, old No significant change since last tracing Confirmed by Jeremiyah Cullens, Cyril Mourning [id number]) on 01/14/2020 4:49:06 AM       EKG Interpretation  Date/Time:  Monday January 14 2020 05:23:04 EST Ventricular Rate:  92 PR Interval:    QRS Duration: 150 QT Interval:  395 QTC Calculation: 489 R Axis:   -133 Text Interpretation: Sinus rhythm Nonspecific intraventricular conduction delay Anterior infarct, old No significant change since last tracing Confirmed by Krysti Hickling, Cyril Mourning [phone]) on 01/14/2020 5:26:37 AM       CRITICAL CARE Performed by: Cyril Mourning Brionna Romanek   Total critical care time: 55 minutes  Critical care time was exclusive of separately billable procedures and treating other patients.  Critical care was necessary to treat or prevent imminent or life-threatening deterioration.  Critical care was time spent personally by me on the following activities: development of treatment plan with patient and/or surrogate as well as nursing, discussions with consultants, evaluation of patient's response to treatment, examination of patient, obtaining history from patient or surrogate, ordering  and performing treatments and interventions, ordering and review of laboratory studies, ordering and review of radiographic studies, pulse oximetry and re-evaluation of patient's condition.   Ciela Daigrepont was evaluated in Emergency Department on 01/14/2020 for the symptoms described in the history of present illness. She was evaluated in the context of the global COVID-19 pandemic, which necessitated consideration that the patient might be at risk for infection with the SARS-CoV-2 virus that causes COVID-19. Institutional protocols and algorithms that pertain to the evaluation of patients at risk for COVID-19 are in a state of rapid change based on information released by regulatory bodies including the CDC and federal and state organizations. These policies and algorithms were followed during the patient's care in the ED.  Patient was seen wearing N95, face shield, gloves, gown.    Tehilla Coffel, Delice Bison, DO 01/14/20 [phone]

## 2020-01-14 NOTE — Progress Notes (Signed)
Per MD request, administrative coordinator contacted family and received information about the location of the patient's original Covid test. Per Virgina Evener, the patient's husband, it was administered at [facility] [phone] I spoke with their medical assistant who is faxing over a copy of Ms. First results. Mr. Pool would like to update the ED and inpatient staff that he can be contacted at [phone]. This is his correct mobile number.

## 2020-01-14 NOTE — ED Notes (Signed)
Additional PIV initiated, 20G to RAC. IV flushes with 10 cc NS without s/s of infiltration. Positive blood return noted. Secured with tape and tegaderm. Labs and both sets cultures drawn at bedside and labeled with 2 pt identifiers

## 2020-01-14 NOTE — H&P (Signed)
History and Physical    Vicki [name] [id number] DOB: 10-20-1946 DOA: 01/14/2020  PCP: Cassandria Anger, MD Consultants:  Lowell Guitar - cardiology Patient coming from:  Home - lives with husband; NOK: [place], [place], [phone]  Chief Complaint: Worsening COVID symptoms  HPI: Vicki [name] is a 74 y.o. female with medical history significant of remote breast cancer s/p rads therapy; afib; and HLD presenting with SOB, COVID-19 infection.  She was diagnosed with COVID-19 around 1/28.  Her physician started her on hydroxychloroquine on 2/1 (at her son's insistence) and she had no clinical improvement.  She was seen in the ER on 2/5 with diarrhea and cough; she was given IVF and inhaler and she was discharged to home.  Since then, she has had ongoing SOB and cough and severe lethargy.  She thinks she obtained COVID from attending church.  Her husband is going to get his second dose of the vaccine today and has not apparently developed infection.   ED Course:  Carryover, per Dr. Alcario Drought:  COVID+ with covid PNA (progressed), mild new O2 requirement, satting upper 80s on RA. Dx with COVID jan 28th. EDP ordering remdesivir and decadron.   Review of Systems: As per HPI; otherwise review of systems reviewed and negative.   Ambulatory Status:  Ambulates without assistance  Past Medical History:  Diagnosis Date  . Allergic rhinitis   . Anxiety   . Aortic insufficiency   . Contact dermatitis   . Diverticulosis of colon   . DJD (degenerative joint disease)   . GERD (gastroesophageal reflux disease)   . Hx of colonic polyps   . Hyperlipidemia   . Macular degeneration, bilateral   . Osteopenia   . Paroxysmal atrial fibrillation (HCC)    A. fib is now persistent.  . Personal history of radiation therapy 2008   Left Breast Cancer  . Visit for monitoring Tikosyn therapy 09/06/2015    Past Surgical History:  Procedure Laterality Date  . ABDOMINAL HYSTERECTOMY   1984   with 1 ovary removal  . BREAST LUMPECTOMY Left 08/2007    with node bx  . ELECTROPHYSIOLOGIC STUDY N/A 05/07/2016   Procedure: Atrial Fibrillation Ablation;  Surgeon: Will Meredith Leeds, MD;  Location: [facility];  Service: Cardiovascular;  Laterality: N/A;    Social History   Socioeconomic History  . Marital status: Married    Spouse name: Not on file  . Number of children: 1  . Years of education: Not on file  . Highest education level: Not on file  Occupational History  . Occupation: ACCOUNTING    Employer: [employer]  Tobacco Use  . Smoking status: Former Smoker    Types: Cigarettes    Quit date: 12/06/1976    Years since quitting: 43.1  . Smokeless tobacco: Never Used  . Tobacco comment: smoked approx 10 years  Substance and Sexual Activity  . Alcohol use: No    Alcohol/week: 0.0 standard drinks  . Drug use: No  . Sexual activity: Not Currently  Other Topics Concern  . Not on file  Social History Narrative  . Not on file   Social Determinants of Health   Financial Resource Strain:   . Difficulty of Paying Living Expenses: Not on file  Food Insecurity:   . Worried About Charity fundraiser in the Last Year: Not on file  . Ran Out of Food in the Last Year: Not on file  Transportation Needs:   . Lack  of Transportation (Medical): Not on file  . Lack of Transportation (Non-Medical): Not on file  Physical Activity:   . Days of Exercise per Week: Not on file  . Minutes of Exercise per Session: Not on file  Stress:   . Feeling of Stress : Not on file  Social Connections: Unknown  . Frequency of Communication with Friends and Family: Not on file  . Frequency of Social Gatherings with Friends and Family: Not on file  . Attends Religious Services: Not on file  . Active Member of Clubs or Organizations: Yes  . Attends Archivist Meetings: Not on file  . Marital Status: Not on file  Intimate Partner Violence:   . Fear of Current or  Ex-Partner: Not on file  . Emotionally Abused: Not on file  . Physically Abused: Not on file  . Sexually Abused: Not on file    Allergies  Allergen Reactions  . Codeine     REACTION: unknown    Family History  Problem Relation Age of Onset  . Heart disease Father 78  . Heart attack Father   . Other Mother 1       AVR  . Heart disease Mother   . Clotting disorder Mother 50  . Breast cancer Sister   . Lymphoma Sister   . Hypertension Sister   . Healthy Brother   . Other Maternal Grandmother   . Colon cancer Neg Hx   . Esophageal cancer Neg Hx   . Pancreatic cancer Neg Hx     Prior to Admission medications   Medication Sig Start Date End Date Taking? Authorizing Provider  acetaminophen (TYLENOL) 325 MG tablet Take 500 mg by mouth every 6 (six) hours as needed for mild pain or fever.    Yes [provider]  cholecalciferol (VITAMIN D) 1000 UNITS tablet Take 1,000 Units by mouth 2 (two) times daily.   Yes [provider]  hydroxychloroquine (PLAQUENIL) 200 MG tablet Take 400 mg bid x 2 days, then 200 mg daily for 5 dais if fever, cough, shortness of breath Patient taking differently: Take 200-400 mg by mouth See admin instructions. Take 2 tablets twice daily for 2 days then take 1 tablet daily for 5 days if fever, cough, shortness of breath 01/05/20  Yes Plotnikov, Evie Lacks, MD  loratadine (CLARITIN) 10 MG tablet Take 10 mg by mouth daily.   Yes [provider]  Melatonin 3 MG TABS Take 3 mg by mouth at bedtime as needed (for sleep).    Yes [provider]  Multiple Vitamins-Minerals (ICAPS) TABS Take 1 tablet by mouth 2 (two) times daily.    Yes [provider]  ondansetron (ZOFRAN) 4 MG tablet Take 1 tablet (4 mg total) by mouth every 6 (six) hours. 01/11/20  Yes Couture, Cortni S, PA-C  simvastatin (ZOCOR) 20 MG tablet TAKE 1 TABLET BY MOUTH AT BEDTIME Patient taking differently: Take 20 mg by mouth at bedtime.  11/15/19  Yes  Plotnikov, Evie Lacks, MD  XARELTO 20 MG TABS tablet Take 1 tablet by mouth once daily Patient taking differently: Take 20 mg by mouth daily with supper.  08/23/19  Yes Lorretta Harp, MD  PEPCID [facility] MAXIMUM STRENGTH 20 MG tablet Take 1 tablet by mouth once daily Patient not taking: Reported on 01/14/2020 04/16/19   Lorretta Harp, MD    Physical Exam: Vitals:   01/14/20 0615 01/14/20 0630 01/14/20 0645 01/14/20 0700  BP: 139/71 124/66 122/64 133/60  Pulse:  79 76 79 76  Resp: (!) 28 (!) 23 (!) 23 (!) 25  Temp:      TempSrc:      SpO2: 97% 97% 97% 97%  Weight:      Height:         . General:  Appears calm and comfortable and is NAD . Eyes:  PERRL, EOMI, normal lids, iris . ENT:  grossly normal hearing, lips & tongue, mmm; appropriate dentition . Neck:  no LAD, masses or thyromegaly . Cardiovascular:  RRR, no m/r/g. No LE edema.  Marland Kitchen Respiratory:   Scattered rhonchi.  Mildly increased respiratory effort on [place] O2. . Abdomen:  soft, NT, ND, NABS . Back:   normal alignment, no CVAT . Skin:  no rash or induration seen on limited exam . Musculoskeletal:  grossly normal tone BUE/BLE, good ROM, no bony abnormality . Lower extremity:  No LE edema.  Limited foot exam with no ulcerations.  2+ distal pulses. Marland Kitchen Psychiatric:  grossly normal mood and affect, speech fluent and appropriate, AOx3 . Neurologic:  CN 2-12 grossly intact, moves all extremities in coordinated fashion    Radiological Exams on Admission: DG Chest Port 1 View  Result Date: 01/14/2020 CLINICAL DATA:  74 year old female with COVID-19 diagnosed late last month. Shortness of breath. EXAM: PORTABLE CHEST 1 VIEW COMPARISON:  Portable chest 01/11/2020 and earlier. FINDINGS: Portable AP semi upright view at 0600 hours. Increasing widespread and confluent bilateral mid and lower lung opacity. Upper lungs seem to remain spared. Lower lung volumes. No pleural effusion. Stable cardiac size and mediastinal contours. Visualized tracheal  air column is within normal limits. No acute osseous abnormality identified. IMPRESSION: Progressive bilateral COVID-19 pneumonia since 01/11/2020. Electronically Signed   By: Genevie Ann M.D.   On: 01/14/2020 06:11    EKG: Independently reviewed.   0444 and 0523 - NSR with rate 95, 92; IVCD; nonspecific ST changes with no evidence of acute ischemia; NSCSLT   Labs on Admission: I have personally reviewed the available labs and imaging studies at the time of the admission.  Pertinent labs:   Na++ 129 Glucose 125 Albumin 2.6 BNP 357.4 LDH 286 HS troponin 7 Ferritin 199 CRP 16.9 Lactate 1.4 Procalcitonin 0.57 WBC 8.1, +lymphopenia Hgb 11.4 D-dimer 1.47 Fibrinogen 655 Blood cultures pending   Assessment/Plan Principal Problem:   Acute respiratory disease due to COVID-19 virus Active Problems:   Dyslipidemia   PAROXYSMAL ATRIAL FIBRILLATION    Acute respiratory failure with hypoxia associated with COVID-19 infection -Patient with presenting with SOB, cough, fever, and lethargy -Prior diarrhea but this has resolved; she still has loss of smell -She does not have a usual home O2 requirement and is currently requiring 2L  O2 -COVID POSITIVE on 1/28; test is not available in our system and so repeat testing is pending -The patient has comorbidities which may increase the risk for ARDS/MODS including: age -Pertinent labs concerning for COVID include lymphopenia; increased LDH; markedly elevated D-dimer (>1); markedly elevated CRP (>7); increased fibrinogen -CXR with multifocal opacities which may be c/w COVID vs. Multifocal PNA -Will treat with broad-spectrum antibiotics given procalcitonin >0.1.   -Will admit to [facility] for further evaluation, close monitoring, and treatment once positive test has resulted -Monitor on telemetry x at least 24 hours -At this time, will attempt to avoid use of aerosolized medications and use HFAs instead -Will check daily labs including BMP with Mag,  Phos; LFTs; CBC with differential; CRP; ferritin; fibrinogen; D-dimer -Will order steroids and Remdesivir (pharmacy  consult) given +COVID test, +CXR, and hypoxia <94% on room air -If the patient shows clinical deterioration, consider transfer to ICU with PCCM consultation -Consider Tocilizumab and/or convalescent plasma if the patient does not stabilize on current treatment or if the patient has marked clinical decompensation; the patient does not appear to require these treatments at this time. -Will attempt to maintain euvolemia to a net negative fluid status -Will ask the patient to maintain an awake prone position for 16+ hours a day, if possible, with a minimum of 2-3 hours at a time -With D-dimer <5, will use standard-dosed Lovenox for DVT prevention -Patient was seen wearing full PPE including: gown, gloves, head cover, N95, and face shield; donning and doffing was in compliance with current standards.  PAF -She is currently in NSR and rate controlled without medications -Continue Xarelto  HLD -Continue Zocor    DVT prophylaxis:  Xarelto Code Status:  Full - confirmed with patient Family Communication: None present; I spoke with the patient's husband by telephone at the time of admission. Disposition Plan:  She is anticipated to d/c to home without [facility] services once her respiratory issues have been resolved.  She may require home O2 at the time of discharge. Consults called: None  Admission status: Admit - It is my clinical opinion that admission to INPATIENT is reasonable and necessary because of the expectation that this patient will require hospital care that crosses at least 2 midnights to treat this condition based on the medical complexity of the problems presented.  Given the aforementioned information, the predictability of an adverse outcome is felt to be significant.     Karmen Bongo MD [facility]   How to contact the [facility] Attending or Consulting provider [place] or covering provider during after hours [place], for this patient?  1. Check the care team in [facility] and look for a) attending/consulting TRH provider listed and b) the [facility] team listed 2. Log into [email or web address] and use [place]'s universal password to access. If you do not have the password, please contact the hospital operator. 3. Locate the [facility] provider you are looking for under [facility] and page to a number that you can be directly reached. 4. If you still have difficulty reaching the provider, please page the [facility] (Director on Call) for the Hospitalists listed on amion for assistance.   01/14/2020, 8:48 AM

## 2020-01-14 NOTE — ED Notes (Addendum)
Decadron given per MAR. Name/DOB verified with pt. Repeat troponin drawn, labeled with 2 pt identifiers, and sent to lab. Pt remains on cart in NAD. VSS. Breathing non-labored. Equal rise and fall of chest noted. Call light within reach

## 2020-01-14 NOTE — Progress Notes (Signed)
Gave pt flutter valve and incentive spirometer and taught pt how to use them both. She was able to reach 1000 on IS. Encouraged her to use both at least 4-5 times an hour each. Pt didn't have any questions.

## 2020-01-14 NOTE — ED Notes (Signed)
Dr. Leonides Schanz at bedside Pt satting between 89-91% on RA. Placed on 2L [place] with improved sats to 96%.

## 2020-01-14 NOTE — ED Notes (Signed)
Tylenol given per MAR. Name/DOB verified with pt. All labs labeled with 2 pt identifiers and sent

## 2020-01-14 NOTE — ED Notes (Signed)
Vicki [name] ([facility]/Transfer to [facility]) called @ [phone]-per Magda Paganini, RN called by Levada Dy

## 2020-01-14 NOTE — Progress Notes (Signed)
Called pt husband and gave update. Also gave pt husband direct phone # to pt room so he can call her anytime. He was appreciative of update.

## 2020-01-14 NOTE — ED Notes (Signed)
Report given to [facility]

## 2020-01-15 LAB — LACTATE DEHYDROGENASE: LDH: 261 U/L — ABNORMAL HIGH (ref 98–192)

## 2020-01-15 LAB — COMPREHENSIVE METABOLIC PANEL
ALT: 38 U/L (ref 0–44)
AST: 43 U/L — ABNORMAL HIGH (ref 15–41)
Albumin: 2.7 g/dL — ABNORMAL LOW (ref 3.5–5.0)
Alkaline Phosphatase: 66 U/L (ref 38–126)
Anion gap: 9 (ref 5–15)
BUN: 13 mg/dL (ref 8–23)
CO2: 26 mmol/L (ref 22–32)
Calcium: 8.4 mg/dL — ABNORMAL LOW (ref 8.9–10.3)
Chloride: 98 mmol/L (ref 98–111)
Creatinine, Ser: 0.57 mg/dL (ref 0.44–1.00)
GFR calc Af Amer: >60 mL/min (ref >60)
GFR calc non Af Amer: >60 mL/min (ref >60)
Glucose, Bld: 138 mg/dL — ABNORMAL HIGH (ref 70–99)
Potassium: 3.6 mmol/L (ref 3.5–5.1)
Sodium: 133 mmol/L — ABNORMAL LOW (ref 135–145)
Total Bilirubin: 0.6 mg/dL (ref 0.3–1.2)
Total Protein: 6 g/dL — ABNORMAL LOW (ref 6.5–8.1)

## 2020-01-15 LAB — URINALYSIS, ROUTINE W REFLEX MICROSCOPIC
Bacteria, UA: NONE SEEN
Bilirubin Urine: NEGATIVE
Glucose, UA: >=500 mg/dL — AB
Hgb urine dipstick: NEGATIVE
Ketones, ur: NEGATIVE mg/dL
Leukocytes,Ua: NEGATIVE
Nitrite: NEGATIVE
Protein, ur: NEGATIVE mg/dL
Specific Gravity, Urine: 1.021 (ref 1.005–1.030)
pH: 6 (ref 5.0–8.0)

## 2020-01-15 LAB — CBC
HCT: 29.5 % — ABNORMAL LOW (ref 36.0–46.0)
Hemoglobin: 10.6 g/dL — ABNORMAL LOW (ref 12.0–15.0)
MCH: 32.8 pg (ref 26.0–34.0)
MCHC: 35.9 g/dL (ref 30.0–36.0)
MCV: 91.3 fL (ref 80.0–100.0)
Platelets: 290 10*3/uL (ref 150–400)
RBC: 3.23 MIL/uL — ABNORMAL LOW (ref 3.87–5.11)
RDW: 12.2 % (ref 11.5–15.5)
WBC: 6 10*3/uL (ref 4.0–10.5)
nRBC: 0 % (ref 0.0–0.2)

## 2020-01-15 LAB — PHOSPHORUS: Phosphorus: 3.2 mg/dL (ref 2.5–4.6)

## 2020-01-15 LAB — URINE CULTURE

## 2020-01-15 LAB — BRAIN NATRIURETIC PEPTIDE: B Natriuretic Peptide: 287.9 pg/mL — ABNORMAL HIGH (ref 0.0–100.0)

## 2020-01-15 LAB — MAGNESIUM: Magnesium: 2.1 mg/dL (ref 1.7–2.4)

## 2020-01-15 LAB — C-REACTIVE PROTEIN: CRP: 12.8 mg/dL — ABNORMAL HIGH (ref <1.0)

## 2020-01-15 LAB — D-DIMER, QUANTITATIVE: D-Dimer, Quant: 1.13 ug/mL-FEU — ABNORMAL HIGH (ref 0.00–0.50)

## 2020-01-15 LAB — FERRITIN: Ferritin: 213 ng/mL (ref 11–307)

## 2020-01-15 MED ORDER — ENSURE ENLIVE PO LIQD
237.0000 mL | Freq: Three times a day (TID) | ORAL | Status: DC
  Administered 2020-01-15 – 2020-01-18 (×9): 237 mL via ORAL

## 2020-01-15 MED ORDER — LIP MEDEX EX OINT
1.0000 "application " | TOPICAL_OINTMENT | CUTANEOUS | Status: DC | PRN
  Administered 2020-01-16: 1 via TOPICAL
  Filled 2020-01-15: qty 7

## 2020-01-15 NOTE — Plan of Care (Signed)

## 2020-01-15 NOTE — Progress Notes (Signed)
Occupational Therapy Evaluation Patient Details Name: Silveria Lako MRN: [id number] DOB: [birth year] Today's Date: 01/15/2020    History of Present Illness 74 year old female admitted with Covid; PMH includes remote breast cancer s/p radiation. According to the chart her son insisted that she take hydroxychlorquine prior to her being admitted-- but there was no improvement. She also has diverticulosis, DJD, GERD ,AFIB and osteopenia.   Clinical Impression   PTA pt resided with husband, independent in ADL, IADL, and mobility tasks. Pt does not ambulate with an assistive device and reports 0 falls in the last 6 months. Pt currently independent to min guard for self-care and mobility tasks. Pt able to ambulate to/from bathroom with min hand held assist. Noted 0 instances of loss of balance, however pt unsteady on feet. Pt completed toileting task with use of grab bar for support. Pt tolerated standing 1 x 8 min at the sink to complete grooming and sponge bathing tasks. Pt required several seated rest breaks due to fatigue. Pt on room air with SpO2 maintaining in 90s throughout. Pt reported min shortness of breath with activity. Educated pt on safety strategies and energy conservation techniques with good understanding. RN updated. Pt demonstrated decreased strength, endurance, balance, standing tolerance, and activity tolerance impacting ability to complete self-care and functional transfer tasks. Recommend skilled OT services to address above deficits in order to promote function and prevent further decline.     Follow Up Recommendations  No OT follow up;Supervision - Intermittent    Equipment Recommendations  None recommended by OT    Recommendations for Other Services       Precautions / Restrictions Precautions Precautions: Fall;Other (comment) Precaution Comments: Monitor SpO2  Restrictions Weight Bearing Restrictions: No      Mobility Bed Mobility Overal bed mobility:  Independent             General bed mobility comments: Pt seated on toilet upon OT arrival  Transfers Overall transfer level: Needs assistance Equipment used: 1 person hand held assist Transfers: Sit to/from Stand;Stand Pivot Transfers Sit to Stand: Min guard Stand pivot transfers: Min guard       General transfer comment: Noted 0 instances of LOB, however pt unsteady on feet    Balance Overall balance assessment: Mild deficits observed, not formally tested                                         ADL either performed or assessed with clinical judgement   ADL Overall ADL's : Needs assistance/impaired Eating/Feeding: Independent;Sitting   Grooming: Wash/dry hands;Wash/dry face;Oral care;Brushing hair;Supervision/safety;Standing Grooming Details (indicate cue type and reason): While standing at the sink Upper Body Bathing: Supervision/ safety;Standing   Lower Body Bathing: Supervison/ safety;Sit to/from stand       Lower Body Dressing: Supervision/safety;Sit to/from stand   Toilet Transfer: Supervision/safety;Grab bars;Ambulation;Regular Toilet   Toileting- Water quality scientist and Hygiene: Supervision/safety;Sit to/from stand       Functional mobility during ADLs: Min guard General ADL Comments: Pt able to ambulate to/from bathroom without an assistive device, requiring hand held. Noted 0 instances of LOB, however pt unsteady on feet.     Vision Baseline Vision/History: No visual deficits       Perception     Praxis      Pertinent Vitals/Pain Pain Assessment: No/denies pain     Hand Dominance Right   Extremity/Trunk Assessment Upper Extremity Assessment  Upper Extremity Assessment: Generalized weakness   Lower Extremity Assessment Lower Extremity Assessment: Defer to PT evaluation   Cervical / Trunk Assessment Cervical / Trunk Assessment: Normal   Communication Communication Communication: No difficulties   Cognition  Arousal/Alertness: Awake/alert Behavior During Therapy: WFL for tasks assessed/performed Overall Cognitive Status: Within Functional Limits for tasks assessed                                 General Comments: Pt very motivated to participate in therapy.    General Comments  Pt on room air with SpO2 maintaining in 90s throughout. Pt reports min SOB with activity.     Exercises Exercises: Other exercises General Exercises - Lower Extremity Ankle Circles/Pumps: AROM;Seated Gluteal Sets: AROM;Seated Hip ABduction/ADduction: AROM;Seated Straight Leg Raises: AROM;Seated Other Exercises Other Exercises: Flutter valve x 10 Other Exercises: Incentive spirometer x 10. Pulling 1063mL.   Shoulder Instructions      Home Living Family/patient expects to be discharged to:: Private residence Living Arrangements: Spouse/significant other Available Help at Discharge: Family Type of Home: House Home Access: Stairs to enter Technical brewer of Steps: 3   Home Layout: One level     Bathroom Shower/Tub: Occupational psychologist: Standard Bathroom Accessibility: Yes   Home Equipment: Shower seat - built in          Prior Functioning/Environment Level of Independence: Independent        Comments: Pt independent in ADLs, IADLs, and mobility. Pt does not ambulate with an assistive device and reports 0 falls in the last 6 months. Pt still drives.         OT Problem List: Decreased strength;Decreased activity tolerance;Impaired balance (sitting and/or standing);Cardiopulmonary status limiting activity      OT Treatment/Interventions: Self-care/ADL training;Therapeutic exercise;Neuromuscular education;Energy conservation;DME and/or AE instruction;Therapeutic activities;Patient/family education;Balance training    OT Goals(Current goals can be found in the care plan section) Acute Rehab OT Goals Patient Stated Goal: To go home Time For Goal Achievement:  01/29/20 Potential to Achieve Goals: Good ADL Goals Pt/caregiver will Perform Home Exercise Program: Increased strength;Both right and left upper extremity;With theraband;Independently;With written HEP provided Additional ADL Goal #1: Pt to complete ADLs independently with SpO2 maintaining in the 90s. Additional ADL Goal #2: Pt to tolerate standing up to 10 min independently, in preparation for ADLs. Additional ADL Goal #3: Pt to recall and verbalize 3 energy conservation strategies with 0 verbal cues.  OT Frequency: Min 3X/week   Barriers to D/C:            Co-evaluation              AM-PAC OT "6 Clicks" Daily Activity     Outcome Measure Help from another person eating meals?: None Help from another person taking care of personal grooming?: A Little Help from another person toileting, which includes using toliet, bedpan, or urinal?: A Little Help from another person bathing (including washing, rinsing, drying)?: A Little Help from another person to put on and taking off regular upper body clothing?: A Little Help from another person to put on and taking off regular lower body clothing?: A Little 6 Click Score: 19   End of Session Nurse Communication: Mobility status  Activity Tolerance: Patient limited by fatigue Patient left: in chair;with call bell/phone within reach  OT Visit Diagnosis: Unsteadiness on feet (R26.81);Muscle weakness (generalized) (M62.81)  Time: [id number] OT Time Calculation (min): 34 min Charges:  OT General Charges $OT Visit: 1 Visit OT Evaluation $OT Eval Low Complexity: 1 Low OT Treatments $Self Care/Home Management : 8-22 mins  Mauri Brooklyn OTR/L [phone]  Mauri Brooklyn 01/15/2020, 11:04 AM

## 2020-01-15 NOTE — Evaluation (Signed)
Physical Therapy Evaluation Patient Details Name: Vicki [name] MRN: [id number] DOB: 1946-05-20 Today's Date: 01/15/2020   History of Present Illness  74 year old female admitted with Covid; PMH includes remote breast cancer s/p radiation. According to the chart her son insisted that she take hydroxychlorquine prior to her being admitted-- but there was no improvement. She also has diverticulosis, DJD, GERD ,AFIB and osteopenia.  Clinical Impression  Very pleasant, cooperative, and noted to be independent in PLOF- used no AD for ambulation. Currently on Room Air. She lives with her spouse in one level home, walk in shower with seat, no grab bars, commode standard height. No outside steps, 2 steps into sun room inside, and another 2 steps into main living area. Husband is primary driver. She is generally weak, easily fatigues. Did desat to 87 % twice - once during bed to chair ambulation, and once while drinking a little coffee. Recovery to 92% in < 2 minutes in both cases. Should benefit from PT to address goals in accordance with optimal functional outcomes.     Follow Up Recommendations No PT follow up    Equipment Recommendations       Recommendations for Other Services       Precautions / Restrictions Precautions Precautions: Fall Restrictions Weight Bearing Restrictions: No      Mobility  Bed Mobility Overal bed mobility: Independent                Transfers Overall transfer level: Independent Equipment used: None             General transfer comment: Requires Close supervision- primarily due to fatigue/generalized weakness  Ambulation/Gait Ambulation/Gait assistance: Supervision(close supervision) Gait Distance (Feet): 8 Feet(was "very tired" and HR increased to 120 just with those few steps to [facility] chair.) Assistive device: None;1 person hand held assist Gait Pattern/deviations: Narrow base of support;Shuffle   Gait velocity interpretation: 1.31 - 2.62  ft/sec, indicative of limited community Conservation officer, historic buildings Rankin (Stroke Patients Only)       Balance Overall balance assessment: Mild deficits observed, not formally tested                                           Pertinent Vitals/Pain      Home Living Family/patient expects to be discharged to:: Private residence Living Arrangements: Spouse/significant other   Type of Home: House       Home Layout: One level Home Equipment: None      Prior Function Level of Independence: Independent               Hand Dominance   Dominant Hand: Right    Extremity/Trunk Assessment        Lower Extremity Assessment Lower Extremity Assessment: Generalized weakness    Cervical / Trunk Assessment Cervical / Trunk Assessment: Normal  Communication   Communication: No difficulties  Cognition Arousal/Alertness: Awake/alert Behavior During Therapy: WFL for tasks assessed/performed Overall Cognitive Status: Within Functional Limits for tasks assessed                                 General Comments: She is receptive to PT. She did not use oxygen at home and is currently on  Room Air. Per patient, she indicates that the physician mentioned that she might be able to go home in next "couple of days".      General Comments General comments (skin integrity, edema, etc.): Monitor skin and joint integrity. Fair tone and turgor    Exercises General Exercises - Lower Extremity Ankle Circles/Pumps: AROM;Seated Gluteal Sets: AROM;Seated Hip ABduction/ADduction: AROM;Seated Straight Leg Raises: AROM;Seated Other Exercises Other Exercises: Practiced IS and able to achieve 750 for 8 out of 10 reps Other Exercises: Practiced Flutter Valve unit with good return demo   Assessment/Plan    PT Assessment Patient needs continued PT services  PT Problem List Decreased strength;Decreased activity  tolerance;Decreased mobility       PT Treatment Interventions      PT Goals (Current goals can be found in the Care Plan section)  Acute Rehab PT Goals Patient Stated Goal: Just want to get well and be safe- careful when I go home. PT Goal Formulation: With patient Time For Goal Achievement: 01/29/20 Potential to Achieve Goals: Good    Frequency Min 3X/week   Barriers to discharge        Co-evaluation               AM-PAC PT "6 Clicks" Mobility  Outcome Measure Help needed turning from your back to your side while in a flat bed without using bedrails?: None Help needed moving from lying on your back to sitting on the side of a flat bed without using bedrails?: None Help needed moving to and from a bed to a chair (including a wheelchair)?: A Little Help needed standing up from a chair using your arms (e.g., wheelchair or bedside chair)?: A Little Help needed to walk in hospital room?: A Little Help needed climbing 3-5 steps with a railing? : A Little 6 Click Score: 20    End of Session   Activity Tolerance: Patient limited by fatigue Patient left: in chair;with call bell/phone within reach Nurse Communication: Mobility status PT Visit Diagnosis: Muscle weakness (generalized) (M62.81);Unsteadiness on feet (R26.81)    Time: [id number] PT Time Calculation (min) (ACUTE ONLY): 45 min   Charges:   PT Evaluation $PT Eval Moderate Complexity: 1 Mod PT Treatments $Therapeutic Exercise: 8-22 mins $Therapeutic Activity: 8-22 mins       Rollen Sox, PT # [phone] CGV cell  Casandra Doffing 01/15/2020, 10:05 AM

## 2020-01-15 NOTE — Progress Notes (Signed)
Initial Nutrition Assessment RD working remotely.  DOCUMENTATION CODES:   Not applicable  INTERVENTION:   Ensure Enlive po TID, each supplement provides 350 kcal and 20 grams of protein.  Pt receiving Hormel Shake daily with Breakfast which provides 520 kcals and 22 g of protein and Magic cup BID with lunch and dinner, each supplement provides 290 kcal and 9 grams of protein, automatically on meal trays to optimize nutritional intake.   NUTRITION DIAGNOSIS:   Increased nutrient needs related to acute illness, catabolic [id number]) as evidenced by estimated needs.  GOAL:   Patient will meet greater than or equal to 90% of their needs  MONITOR:   PO intake, Supplement acceptance, Labs  REASON FOR ASSESSMENT:   Malnutrition Screening Tool    ASSESSMENT:   74 yo female admitted with worsening COVID symptoms, COVID PNA. Tested positive for COVID on 1/28. PMH includes remote breast cancer, DJD, GERD, HLD, colonic polyps, macular degeneration, PAF, osteopenia.   Patient is on a regular diet. She reports poor intake PTA related to decreased appetite and difficulty breathing with acute illness. Currently consuming 25-50% of meals. Patient would benefit from PO supplements to ensure adequate intake to meet increased nutrition needs for COVID-19.  Labs reviewed. Na 133 (L) Medications reviewed and include vitamin C, decadron, Prosight MVI, zinc sulfate, remdesivir.   Usual weights reviewed. Patient has lost 3% of usual weight within the past 3 months which is not significant for the time frame.   NUTRITION - FOCUSED PHYSICAL EXAM:  unable to complete  Diet Order:   Diet Order            Diet regular Room service appropriate? Yes; Fluid consistency: Thin  Diet effective now              EDUCATION NEEDS:   Not appropriate for education at this time  Skin:  Skin Assessment: Reviewed RN Assessment  Last BM:  2/8  Height:   Ht Readings from Last 1 Encounters:   01/14/20 5\' 3"  (1.6 m)    Weight:   Wt Readings from Last 1 Encounters:  01/14/20 53.5 kg    Ideal Body Weight:  52.3 kg  BMI:  Body mass index is 20.89 kg/m.  Estimated Nutritional Needs:   Kcal:  1500-1700  Protein:  75-90 gm  Fluid:  >/= 1.5 L    Molli Barrows, RD, LDN, CNSC Contact information can be found in Amion.

## 2020-01-15 NOTE — Progress Notes (Signed)
Called pt husband to give update, no answer. Left VM that I was just calling to give update and he can call back if he has any questions or concerns.

## 2020-01-15 NOTE — Progress Notes (Addendum)
PROGRESS NOTE    Vicki [name]  [id number] DOB: [birth year] DOA: 01/14/2020 PCP: Cassandria Anger, MD   Brief Narrative:  74 year old with history of breast cancer status post radiation, atrial fibrillation, hyperlipidemia presented with shortness of breath secondary to COVID-19.  Outpatient she had received hydroxychloroquine without clinical improvement.  Even was seen in the ER on 2/5 with diarrhea when she received IV fluids and inhaler but then discharged home.  Despite of going home she progressively worsened therefore came to the hospital.   Assessment & Plan:   Principal Problem:   Acute respiratory disease due to COVID-19 virus Active Problems:   Dyslipidemia   PAROXYSMAL ATRIAL FIBRILLATION  Acute hypoxic respiratory distress secondary to COVID-19 pneumonia, improved -Oxygen levels-90% room air -Remdesivir-day 2 -Decadrone-day 2 -Actemra-none -Convalescent plasma-none -Antibiotics-none -procalcitonin-0.57 -Chest x-ray-bilateral infiltrate -Supportive care-antitussive, inhalers, I-S/flutter -CODE STATUS confirmed -Vitamin C & Zinc. Prone >16hrs/day.  -Routine: Labs have been reviewed including ferritin, LDH, CRP, d-dimer, fibrinogen.  Will need to trend this lab daily.  Paroxysmal atrial fibrillation -On Xarelto  Hyperlipidemia -Zocor  Addendum 145pm: Complaining if Burning with urination. Will order UA, if positive will start Rocephin.   DVT prophylaxis: Xarelto Code Status: Full code Family Communication:  Spoke with her husband. He is updated.  Disposition Plan:   Patient From= home  Patient Anticipated D/C place= to be determined pending PT/OT evaluation  Barriers= given the extent of her infiltrate, failure of outpatient therapy and still elevated inflammatory markers we will keep her in the hospital at least another 1-2 days to ensure she continues to improve.  In the meantime continue IV remdesivir.  Consultants:   None  Procedures:    None  Subjective: Feels fairly better compared to yesterday but not yet back to her baseline. Mild exertional dyspnea and cough.   Review of Systems Otherwise negative except as per HPI, including: General: Denies fever, chills, night sweats or unintended weight loss. Resp: Denies hemoptysis Cardiac: Denies chest pain, palpitations, orthopnea, paroxysmal nocturnal dyspnea. GI: Denies abdominal pain, nausea, vomiting, diarrhea or constipation GU: Denies dysuria, frequency, hesitancy or incontinence MS: Denies muscle aches, joint pain or swelling Neuro: Denies headache, neurologic deficits (focal weakness, numbness, tingling), abnormal gait Psych: Denies anxiety, depression, SI/HI/AVH Skin: Denies new rashes or lesions ID: Denies sick contacts, exotic exposures, travel  Examination:  Constitutional: Not in acute distress Respiratory: Diffuse mild rhonchi Cardiovascular: Normal sinus rhythm, no rubs Abdomen: Nontender nondistended good bowel sounds Musculoskeletal: No edema noted Skin: No rashes seen Neurologic: CN 2-12 grossly intact.  And nonfocal Psychiatric: Normal judgment and insight. Alert and oriented x 3. Normal mood.     Objective: Vitals:   01/15/20 0400 01/15/20 0415 01/15/20 0607 01/15/20 0719  BP: (!) 101/52   (!) 106/56  Pulse: 71 62 79 60  Resp: (!) 28 (!) 21 20 19   Temp: 98.3 F (36.8 C)   97.9 F (36.6 C)  TempSrc: Oral   Oral  SpO2: 91% 95% 90% 91%  Weight:      Height:        Intake/Output Summary (Last 24 hours) at 01/15/2020 0748 Last data filed at 01/15/2020 0600 Gross per 24 hour  Intake 496 ml  Output 1275 ml  Net -779 ml   Filed Weights   01/14/20 0447 01/14/20 2200  Weight: 52.2 kg 53.5 kg     Data Reviewed:   CBC: Recent Labs  Lab 01/11/20 1130 01/14/20 0520 01/15/20 0420  WBC 8.2 8.1 6.0  NEUTROABS  --  6.8  --   HGB 12.3 11.4* 10.6*  HCT 35.9* 33.3* 29.5*  MCV 91.8 91.5 91.3  PLT 247 257 [id number]   Basic Metabolic Panel:  Recent Labs  Lab 01/11/20 1130 01/14/20 0520 01/15/20 0420  NA 127* 129* 133*  K 3.7 3.7 3.6  CL 92* 95* 98  CO2 25 23 26   GLUCOSE 124* 125* 138*  BUN 5* 5* 13  CREATININE 0.66 0.55 0.57  CALCIUM 8.6* 8.0* 8.4*  MG  --   --  2.1  PHOS  --   --  3.2   GFR: Estimated Creatinine Clearance: 51.8 mL/min (by C-G formula based on SCr of 0.57 mg/dL). Liver Function Tests: Recent Labs  Lab 01/11/20 1130 01/14/20 0520 01/15/20 0420  AST 23 37 43*  ALT 18 28 38  ALKPHOS 86 75 66  BILITOT 0.6 0.4 0.6  PROT 6.4* 5.9* 6.0*  ALBUMIN 3.4* 2.6* 2.7*   Recent Labs  Lab 01/11/20 1130  LIPASE 26   No results for input(s): AMMONIA in the last 168 hours. Coagulation Profile: No results for input(s): INR, PROTIME in the last 168 hours. Cardiac Enzymes: No results for input(s): CKTOTAL, CKMB, CKMBINDEX, TROPONINI in the last 168 hours. BNP (last 3 results) No results for input(s): PROBNP in the last 8760 hours. HbA1C: No results for input(s): HGBA1C in the last 72 hours. CBG: No results for input(s): GLUCAP in the last 168 hours. Lipid Profile: Recent Labs    01/14/20 0520  TRIG 71   Thyroid Function Tests: No results for input(s): TSH, T4TOTAL, FREET4, T3FREE, THYROIDAB in the last 72 hours. Anemia Panel: Recent Labs    01/14/20 0520 01/15/20 0420  FERRITIN 199 213   Sepsis Labs: Recent Labs  Lab 01/14/20 0520 01/14/20 0528  PROCALCITON 0.57  --   LATICACIDVEN  --  1.4    Recent Results (from the past 240 hour(s))  Respiratory Panel by RT PCR (Flu A&B, Covid) - Nasopharyngeal Swab     Status: Abnormal   Collection Time: 01/14/20  9:13 AM   Specimen: Nasopharyngeal Swab  Result Value Ref Range Status   SARS Coronavirus 2 by RT PCR POSITIVE (A) NEGATIVE Final    Comment: RESULT CALLED TO, READ BACK BY AND VERIFIED WITH: Alesia Banda RN 10:20 01/14/20 ([email or web address]) (NOTE) SARS-CoV-2 target nucleic acids are DETECTED. SARS-CoV-2 RNA is generally detectable in upper  respiratory specimens  during the acute phase of infection. Positive results are indicative of the presence of the identified virus, but do not rule out bacterial infection or co-infection with other pathogens not detected by the test. Clinical correlation with patient history and other diagnostic information is necessary to determine patient infection status. The expected result is Negative. Fact Sheet for Patients:  PinkCheek.be Fact Sheet for Healthcare Providers: GravelBags.it This test is not yet approved or cleared by the [place] FDA and  has been authorized for detection and/or diagnosis of SARS-CoV-2 by FDA under an Emergency Use Authorization (EUA).  This EUA will remain in effect (meaning this test can be used) f or the duration of  the COVID-19 declaration under Section 564(b)(1) of the Act, 21 U.S.C. section 360bbb-3(b)(1), unless the authorization is terminated or revoked sooner.    Influenza A by PCR NEGATIVE NEGATIVE Final   Influenza B by PCR NEGATIVE NEGATIVE Final    Comment: (NOTE) The Xpert Xpress SARS-CoV-2/FLU/RSV assay is intended as an aid in  the diagnosis of influenza from Nasopharyngeal swab specimens and  should not be used  as a sole basis for treatment. Nasal washings and  aspirates are unacceptable for Xpert Xpress SARS-CoV-2/FLU/RSV  testing. Fact Sheet for Patients: PinkCheek.be Fact Sheet for Healthcare Providers: GravelBags.it This test is not yet approved or cleared by the [place] FDA and  has been authorized for detection and/or diagnosis of SARS-CoV-2 by  FDA under an Emergency Use Authorization (EUA). This EUA will remain  in effect (meaning this test can be used) for the duration of the  Covid-19 declaration under Section 564(b)(1) of the Act, 21  U.S.C. section 360bbb-3(b)(1), unless the authorization is  terminated  or revoked. Performed at [facility], [place] [address], [place], [place] [address]          Radiology Studies: DG Chest [place] 1 View  Result Date: 01/14/2020 CLINICAL DATA:  74 year old female with COVID-19 diagnosed late last month. Shortness of breath. EXAM: PORTABLE CHEST 1 VIEW COMPARISON:  Portable chest 01/11/2020 and earlier. FINDINGS: Portable AP semi upright view at 0600 hours. Increasing widespread and confluent bilateral mid and lower lung opacity. Upper lungs seem to remain spared. Lower lung volumes. No pleural effusion. Stable cardiac size and mediastinal contours. Visualized tracheal air column is within normal limits. No acute osseous abnormality identified. IMPRESSION: Progressive bilateral COVID-19 pneumonia since 01/11/2020. Electronically Signed   By: Genevie Ann M.D.   On: 01/14/2020 06:11        Scheduled Meds: . vitamin C  500 mg Oral Daily  . dexamethasone (DECADRON) injection  6 mg Intravenous Q24H  . famotidine  10 mg Oral BID  . feeding supplement (ENSURE ENLIVE)  237 mL Oral BID BM  . Ipratropium-Albuterol  1 puff Inhalation Q6H  . loratadine  10 mg Oral Daily  . multivitamin  1 tablet Oral BID  . rivaroxaban  20 mg Oral Q supper  . simvastatin  20 mg Oral QHS  . sodium chloride flush  3 mL Intravenous Q12H  . sodium chloride flush  3 mL Intravenous Q12H  . zinc sulfate  220 mg Oral Daily   Continuous Infusions: . sodium chloride    . remdesivir 100 mg in NS 100 mL       LOS: 1 day   Time spent= 25 mins    Ankit Arsenio Loader, MD [facility]  If 7PM-7AM, please contact night-coverage  01/15/2020, 7:48 AM

## 2020-01-16 LAB — CBC
HCT: 27.8 % — ABNORMAL LOW (ref 36.0–46.0)
Hemoglobin: 10.4 g/dL — ABNORMAL LOW (ref 12.0–15.0)
MCH: 34.8 pg — ABNORMAL HIGH (ref 26.0–34.0)
MCHC: 37.4 g/dL — ABNORMAL HIGH (ref 30.0–36.0)
MCV: 93 fL (ref 80.0–100.0)
Platelets: 352 10*3/uL (ref 150–400)
RBC: 2.99 MIL/uL — ABNORMAL LOW (ref 3.87–5.11)
RDW: 12.2 % (ref 11.5–15.5)
WBC: 7.9 10*3/uL (ref 4.0–10.5)
nRBC: 0 % (ref 0.0–0.2)

## 2020-01-16 LAB — COMPREHENSIVE METABOLIC PANEL
ALT: 44 U/L (ref 0–44)
AST: 39 U/L (ref 15–41)
Albumin: 2.6 g/dL — ABNORMAL LOW (ref 3.5–5.0)
Alkaline Phosphatase: 77 U/L (ref 38–126)
Anion gap: 9 (ref 5–15)
BUN: 25 mg/dL — ABNORMAL HIGH (ref 8–23)
CO2: 28 mmol/L (ref 22–32)
Calcium: 8.5 mg/dL — ABNORMAL LOW (ref 8.9–10.3)
Chloride: 97 mmol/L — ABNORMAL LOW (ref 98–111)
Creatinine, Ser: 0.58 mg/dL (ref 0.44–1.00)
GFR calc Af Amer: >60 mL/min (ref >60)
GFR calc non Af Amer: >60 mL/min (ref >60)
Glucose, Bld: 179 mg/dL — ABNORMAL HIGH (ref 70–99)
Potassium: 3.8 mmol/L (ref 3.5–5.1)
Sodium: 134 mmol/L — ABNORMAL LOW (ref 135–145)
Total Bilirubin: 0.4 mg/dL (ref 0.3–1.2)
Total Protein: 5.8 g/dL — ABNORMAL LOW (ref 6.5–8.1)

## 2020-01-16 LAB — FERRITIN: Ferritin: 236 ng/mL (ref 11–307)

## 2020-01-16 LAB — LACTATE DEHYDROGENASE: LDH: 225 U/L — ABNORMAL HIGH (ref 98–192)

## 2020-01-16 LAB — C-REACTIVE PROTEIN: CRP: 6.8 mg/dL — ABNORMAL HIGH (ref <1.0)

## 2020-01-16 LAB — D-DIMER, QUANTITATIVE: D-Dimer, Quant: 0.9 ug/mL-FEU — ABNORMAL HIGH (ref 0.00–0.50)

## 2020-01-16 LAB — PHOSPHORUS: Phosphorus: 2.7 mg/dL (ref 2.5–4.6)

## 2020-01-16 LAB — MAGNESIUM: Magnesium: 2.1 mg/dL (ref 1.7–2.4)

## 2020-01-16 NOTE — Progress Notes (Signed)
Physical Therapy Treatment Patient Details Name: Vicki [name] MRN: [id number] DOB: 09-16-1946 Today's Date: 01/16/2020    History of Present Illness 74 year old female admitted with Covid; PMH includes remote breast cancer s/p radiation. According to the chart her son insisted that she take hydroxychlorquine prior to her being admitted-- but there was no improvement. She also has diverticulosis, DJD, GERD ,AFIB and osteopenia.    PT Comments    She was in [facility] chair when PT arrived. RN had requested that she be walked in the hallway to monitor for any O2 needs and improve overall endurance. She was monitored the entire walk- one brief episode of drop into 80's, but in < 2 minutes recovered to 93%. On arriving back in room and sitting in chair, she had a desat to 82%- again very brief <2 minutes to 96%. No shortness of breath reported. She practiced IS and Flutter valve unit as noted on ex list- and was reminded to perform LE ex as noted. Should benefit from ongoing PT to further address goals for optimal functional outcomes.   Follow Up Recommendations  No PT follow up     Equipment Recommendations       Recommendations for Other Services       Precautions / Restrictions Precautions Precautions: Fall;Other (comment) Precaution Comments: Monitor SpO2  Restrictions Weight Bearing Restrictions: No    Mobility  Bed Mobility Overal bed mobility: Independent             General bed mobility comments: Seated in BS chair when PT arrived  Transfers Overall transfer level: Needs assistance Equipment used: None Transfers: Sit to/from Stand;Stand Pivot Transfers Sit to Stand: Supervision Stand pivot transfers: Supervision       General transfer comment: Noted 0 instances of LOB, however pt unsteady on feet  Ambulation/Gait Ambulation/Gait assistance: Supervision Gait Distance (Feet): 275 Feet Assistive device: None Gait Pattern/deviations: Narrow base of  support;Shuffle   Gait velocity interpretation: >2.62 ft/sec, indicative of community ambulatory     Stairs             Wheelchair Mobility    Modified Rankin (Stroke Patients Only)       Balance Overall balance assessment: Mild deficits observed, not formally tested                                          Cognition Arousal/Alertness: Awake/alert Behavior During Therapy: WFL for tasks assessed/performed Overall Cognitive Status: Within Functional Limits for tasks assessed                                 General Comments: Pt very motivated to participate in therapy.       Exercises General Exercises - Lower Extremity Ankle Circles/Pumps: AROM;Seated Gluteal Sets: AROM;Seated Hip ABduction/ADduction: AROM;Seated Straight Leg Raises: AROM;Seated Other Exercises Other Exercises: Flutter valve x 10 Other Exercises: Incentive spirometer x 10. Pulling 1250 ML    General Comments General comments (skin integrity, edema, etc.): Ambulated for 275 feet-- on RA and with no AD. No shortness of breath reported. On arrival back to room and sitting back in chair- bried drop to 82%, but quickly recovered  (<2 min) to 94%      Pertinent Vitals/Pain Pain Assessment: No/denies pain    Home Living  Prior Function            PT Goals (current goals can now be found in the care plan section) Acute Rehab PT Goals Patient Stated Goal: To go home PT Goal Formulation: With patient Time For Goal Achievement: 01/29/20 Potential to Achieve Goals: Good Progress towards PT goals: Progressing toward goals    Frequency    Min 3X/week      PT Plan      Co-evaluation              AM-PAC PT "6 Clicks" Mobility   Outcome Measure  Help needed turning from your back to your side while in a flat bed without using bedrails?: None Help needed moving from lying on your back to sitting on the side of a flat bed  without using bedrails?: None Help needed moving to and from a bed to a chair (including a wheelchair)?: A Little Help needed standing up from a chair using your arms (e.g., wheelchair or bedside chair)?: A Little Help needed to walk in hospital room?: A Little(Ambulated in hallway 275 feet) Help needed climbing 3-5 steps with a railing? : A Little 6 Click Score: 20    End of Session   Activity Tolerance: Patient tolerated treatment well Patient left: in chair;with call bell/phone within reach   PT Visit Diagnosis: Muscle weakness (generalized) (M62.81);Unsteadiness on feet (R26.81)     Time: [id number] PT Time Calculation (min) (ACUTE ONLY): 30 min  Charges:  $Gait Training: 8-22 mins $Therapeutic Exercise: 8-22 mins                    Rollen Sox, PT # [phone] CGV cell  Casandra Doffing 01/16/2020, 11:38 AM

## 2020-01-16 NOTE — Progress Notes (Signed)
Ambulated Patient in hallway 275 feet on RA, and no reported shortness of breath. Except for 2 brief episodes of desat in 80"s ( 86 and 82%), complete recovery in < 2 minutes to 94%+. No AD and close supervision with no no LOB .

## 2020-01-16 NOTE — Progress Notes (Signed)
PROGRESS NOTE    Vicki [name]  [id number] DOB: August 06, 1946 DOA: 01/14/2020 PCP: Cassandria Anger, MD   Brief Narrative:  74 year old with history of breast cancer status post radiation, atrial fibrillation, hyperlipidemia presented with shortness of breath secondary to COVID-19.  Outpatient she had received hydroxychloroquine without clinical improvement.  Even was seen in the ER on 2/5 with diarrhea when she received IV fluids and inhaler but then discharged home.  Despite of going home she progressively worsened therefore came to the hospital.   Subjective: No acute issues or events overnight, patient continues to complain of marked dyspnea even with minimal exertion although dyspnea at rest appears to be resolving.  We discussed possible early discharged with completion of Remdesivir in the outpatient setting, patient did not feel comfortable being discharged prior to completion of antiviral course given her ongoing symptoms of dyspnea despite improving hypoxia.  Patient declines chest pain, nausea, vomiting, diarrhea, constipation, headache, fevers, chills.  Assessment & Plan:   Principal Problem:   Acute respiratory disease due to COVID-19 virus Active Problems:   Dyslipidemia   PAROXYSMAL ATRIAL FIBRILLATION  Acute hypoxic respiratory distress secondary to COVID-19 pneumonia, improved COVID-19 Labs Recent Labs    01/14/20 0520 01/15/20 0420 01/16/20 0300  DDIMER 1.47* 1.13* 0.90*  FERRITIN 199 213 236  LDH 286* 261* 225*  CRP 16.9* 12.8* 6.8*  - Able to continue on room air with exertion - symptomatic dyspnea without hypoxia - Remdesivir - 01/14/20-->01/18/20 - Decadron - stop date 01/23/20 - Actemra - none given elevated procalcitonin and resolving CRP - Convalescent plasma-none - Antibiotics-none - Chest x-ray-bilateral infiltrate/opacifications consistent with COVID - Supportive care-antitussive, inhalers, I-S/flutter - CODE STATUS confirmed - Vitamin C &  Zinc. Prone >16hrs/day.  - Routine: Labs have been reviewed including ferritin, LDH, CRP, d-dimer, fibrinogen.  Will need to trend this lab daily.  UTI ruled out - Symptoms resolving without treatment, UA unremarkable  Paroxysmal atrial fibrillation, rate controlled. - Continue Xarelto - Patient remains rate controlled, of note she is not on any rate control medications at home  Hyperlipidemia - Continue home simvastatin  DVT prophylaxis: Xarelto Code Status: Full code Family Communication:  Spoke with her husband. He is updated.  Disposition Plan:  Patient From= home Patient Anticipated D/C place= home likely next 24 to 48 hours Barriers= ongoing dyspnea with exertion, transient hypoxia, completion of Remdesivir  Consultants:   None  Procedures:   None  Examination:  General:  Pleasantly resting in bed, No acute distress. HEENT:  Normocephalic atraumatic.  Sclerae nonicteric, noninjected.  Extraocular movements intact bilaterally. Neck:  Without mass or deformity.  Trachea is midline. Lungs:  Clear to auscultate bilaterally without rhonchi, wheeze, or rales. Heart:  Regular rate and rhythm.  Without murmurs, rubs, or gallops. Abdomen:  Soft, nontender, nondistended.  Without guarding or rebound. Extremities: Without cyanosis, clubbing, edema, or obvious deformity. Vascular:  Dorsalis pedis and posterior tibial pulses palpable bilaterally. Skin:  Warm and dry, no erythema, no ulcerations.   Objective: Vitals:   01/16/20 0004 01/16/20 0406 01/16/20 0727 01/16/20 1129  BP: 129/63 130/62 (!) 156/81   Pulse: 74 61 68   Resp: (!) 24 20 (!) 24   Temp: 97.8 F (36.6 C) 98 F (36.7 C) (!) 97.4 F (36.3 C)   TempSrc: Oral Oral Oral   SpO2: 92% 93% 96% 94%  Weight:      Height:        Intake/Output Summary (Last 24 hours) at 01/16/2020 1514 Last  data filed at 01/16/2020 0919 Gross per 24 hour  Intake 580 ml  Output --  Net 580 ml   Filed Weights   01/14/20 0447  01/14/20 2200  Weight: 52.2 kg 53.5 kg   Data Reviewed:   CBC: Recent Labs  Lab 01/11/20 1130 01/14/20 0520 01/15/20 0420 01/16/20 0300  WBC 8.2 8.1 6.0 7.9  NEUTROABS  --  6.8  --   --   HGB 12.3 11.4* 10.6* 10.4*  HCT 35.9* 33.3* 29.5* 27.8*  MCV 91.8 91.5 91.3 93.0  PLT 247 257 290 [id number]   Basic Metabolic Panel: Recent Labs  Lab 01/11/20 1130 01/14/20 0520 01/15/20 0420 01/16/20 0300  NA 127* 129* 133* 134*  K 3.7 3.7 3.6 3.8  CL 92* 95* 98 97*  CO2 25 23 26 28   GLUCOSE 124* 125* 138* 179*  BUN 5* 5* 13 25*  CREATININE 0.66 0.55 0.57 0.58  CALCIUM 8.6* 8.0* 8.4* 8.5*  MG  --   --  2.1 2.1  PHOS  --   --  3.2 2.7   GFR: Estimated Creatinine Clearance: 51.8 mL/min (by C-G formula based on SCr of 0.58 mg/dL).   Liver Function Tests: Recent Labs  Lab 01/11/20 1130 01/14/20 0520 01/15/20 0420 01/16/20 0300  AST 23 37 43* 39  ALT 18 28 38 44  ALKPHOS 86 75 66 77  BILITOT 0.6 0.4 0.6 0.4  PROT 6.4* 5.9* 6.0* 5.8*  ALBUMIN 3.4* 2.6* 2.7* 2.6*   Lipid Profile: Recent Labs    01/14/20 0520  TRIG 71   Sepsis Labs: Recent Labs  Lab 01/14/20 0520 01/14/20 0528  PROCALCITON 0.57  --   LATICACIDVEN  --  1.4    Recent Results (from the past 240 hour(s))  Blood Culture (routine x 2)     Status: None (Preliminary result)   Collection Time: 01/14/20  5:06 AM   Specimen: BLOOD RIGHT HAND  Result Value Ref Range Status   Specimen Description BLOOD RIGHT HAND  Final   Special Requests   Final    BOTTLES DRAWN AEROBIC AND ANAEROBIC Blood Culture results may not be optimal due to an inadequate volume of blood received in culture bottles   Culture   Final    NO GROWTH 2 DAYS Performed at [facility], [place] [address], [place], [place] [address]    Report Status PENDING  Incomplete  Blood Culture (routine x 2)     Status: None (Preliminary result)   Collection Time: 01/14/20  5:20 AM   Specimen: BLOOD  Result Value Ref Range Status   Specimen  Description BLOOD RIGHT ANTECUBITAL  Final   Special Requests   Final    BOTTLES DRAWN AEROBIC AND ANAEROBIC Blood Culture results may not be optimal due to an inadequate volume of blood received in culture bottles   Culture   Final    NO GROWTH 2 DAYS Performed at [facility], [place] [address], [place], [place] [address]    Report Status PENDING  Incomplete  Respiratory Panel by RT PCR (Flu A&B, Covid) - Nasopharyngeal Swab     Status: Abnormal   Collection Time: 01/14/20  9:13 AM   Specimen: Nasopharyngeal Swab  Result Value Ref Range Status   SARS Coronavirus 2 by RT PCR POSITIVE (A) NEGATIVE Final    Comment: RESULT CALLED TO, READ BACK BY AND VERIFIED WITH: Alesia Banda RN 10:20 01/14/20 ([email or web address]) (NOTE) SARS-CoV-2 target nucleic acids are DETECTED. SARS-CoV-2 RNA is generally detectable  in upper respiratory specimens  during the acute phase of infection. Positive results are indicative of the presence of the identified virus, but do not rule out bacterial infection or co-infection with other pathogens not detected by the test. Clinical correlation with patient history and other diagnostic information is necessary to determine patient infection status. The expected result is Negative. Fact Sheet for Patients:  PinkCheek.be Fact Sheet for Healthcare Providers: GravelBags.it This test is not yet approved or cleared by the [place] FDA and  has been authorized for detection and/or diagnosis of SARS-CoV-2 by FDA under an Emergency Use Authorization (EUA).  This EUA will remain in effect (meaning this test can be used) f or the duration of  the COVID-19 declaration under Section 564(b)(1) of the Act, 21 U.S.C. section 360bbb-3(b)(1), unless the authorization is terminated or revoked sooner.    Influenza A by PCR NEGATIVE NEGATIVE Final   Influenza B by PCR NEGATIVE NEGATIVE Final    Comment: (NOTE) The Xpert Xpress  SARS-CoV-2/FLU/RSV assay is intended as an aid in  the diagnosis of influenza from Nasopharyngeal swab specimens and  should not be used as a sole basis for treatment. Nasal washings and  aspirates are unacceptable for Xpert Xpress SARS-CoV-2/FLU/RSV  testing. Fact Sheet for Patients: PinkCheek.be Fact Sheet for Healthcare Providers: GravelBags.it This test is not yet approved or cleared by the [place] FDA and  has been authorized for detection and/or diagnosis of SARS-CoV-2 by  FDA under an Emergency Use Authorization (EUA). This EUA will remain  in effect (meaning this test can be used) for the duration of the  Covid-19 declaration under Section 564(b)(1) of the Act, 21  U.S.C. section 360bbb-3(b)(1), unless the authorization is  terminated or revoked. Performed at [facility], [place] [address], [place], [place] [address]   Urine culture     Status: Abnormal   Collection Time: 01/14/20 12:28 PM   Specimen: Urine, Random  Result Value Ref Range Status   Specimen Description URINE, RANDOM  Final   Special Requests   Final    NONE Performed at [facility], [place] [address], [place], [place] [address]    Culture MULTIPLE SPECIES PRESENT, SUGGEST RECOLLECTION (A)  Final   Report Status 01/15/2020 FINAL  Final     Radiology Studies: No results found.  Scheduled Meds: . vitamin C  500 mg Oral Daily  . dexamethasone (DECADRON) injection  6 mg Intravenous Q24H  . famotidine  10 mg Oral BID  . feeding supplement (ENSURE ENLIVE)  237 mL Oral TID BM  . Ipratropium-Albuterol  1 puff Inhalation Q6H  . loratadine  10 mg Oral Daily  . multivitamin  1 tablet Oral BID  . rivaroxaban  20 mg Oral Q supper  . simvastatin  20 mg Oral QHS  . sodium chloride flush  3 mL Intravenous Q12H  . sodium chloride flush  3 mL Intravenous Q12H  . zinc sulfate  220 mg Oral Daily   Continuous Infusions: . sodium chloride    .  remdesivir 100 mg in NS 100 mL 100 mg (01/16/20 0919)     LOS: 2 days   Time spent= 25 mins  Little Ishikawa, DO [facility]  If 7PM-7AM, please contact night-coverage  01/16/2020, 3:14 PM

## 2020-01-16 NOTE — Plan of Care (Signed)

## 2020-01-16 NOTE — Discharge Instructions (Signed)

## 2020-01-17 ENCOUNTER — Ambulatory Visit: Payer: PPO

## 2020-01-17 LAB — COMPREHENSIVE METABOLIC PANEL
ALT: 36 U/L (ref 0–44)
AST: 32 U/L (ref 15–41)
Albumin: 2.5 g/dL — ABNORMAL LOW (ref 3.5–5.0)
Alkaline Phosphatase: 65 U/L (ref 38–126)
Anion gap: 10 (ref 5–15)
BUN: 20 mg/dL (ref 8–23)
CO2: 28 mmol/L (ref 22–32)
Calcium: 8.4 mg/dL — ABNORMAL LOW (ref 8.9–10.3)
Chloride: 97 mmol/L — ABNORMAL LOW (ref 98–111)
Creatinine, Ser: 0.58 mg/dL (ref 0.44–1.00)
GFR calc Af Amer: >60 mL/min (ref >60)
GFR calc non Af Amer: >60 mL/min (ref >60)
Glucose, Bld: 142 mg/dL — ABNORMAL HIGH (ref 70–99)
Potassium: 4.2 mmol/L (ref 3.5–5.1)
Sodium: 135 mmol/L (ref 135–145)
Total Bilirubin: 0.4 mg/dL (ref 0.3–1.2)
Total Protein: 5.4 g/dL — ABNORMAL LOW (ref 6.5–8.1)

## 2020-01-17 LAB — C-REACTIVE PROTEIN: CRP: 4.1 mg/dL — ABNORMAL HIGH (ref <1.0)

## 2020-01-17 LAB — CBC
HCT: 28.2 % — ABNORMAL LOW (ref 36.0–46.0)
Hemoglobin: 10 g/dL — ABNORMAL LOW (ref 12.0–15.0)
MCH: 33.4 pg (ref 26.0–34.0)
MCHC: 35.5 g/dL (ref 30.0–36.0)
MCV: 94.3 fL (ref 80.0–100.0)
Platelets: 365 10*3/uL (ref 150–400)
RBC: 2.99 MIL/uL — ABNORMAL LOW (ref 3.87–5.11)
RDW: 12.4 % (ref 11.5–15.5)
WBC: 8.1 10*3/uL (ref 4.0–10.5)
nRBC: 0 % (ref 0.0–0.2)

## 2020-01-17 LAB — FERRITIN: Ferritin: 164 ng/mL (ref 11–307)

## 2020-01-17 LAB — LACTATE DEHYDROGENASE: LDH: 224 U/L — ABNORMAL HIGH (ref 98–192)

## 2020-01-17 LAB — MAGNESIUM: Magnesium: 2.1 mg/dL (ref 1.7–2.4)

## 2020-01-17 LAB — D-DIMER, QUANTITATIVE: D-Dimer, Quant: 0.77 ug/mL-FEU — ABNORMAL HIGH (ref 0.00–0.50)

## 2020-01-17 LAB — PHOSPHORUS: Phosphorus: 3.5 mg/dL (ref 2.5–4.6)

## 2020-01-17 NOTE — Progress Notes (Signed)
PROGRESS NOTE    Vicki [name]  [id number] DOB: 19-May-1946 DOA: 01/14/2020 PCP: Cassandria Anger, MD   Brief Narrative:  74 year old with history of breast cancer status post radiation, atrial fibrillation, hyperlipidemia presented with shortness of breath secondary to COVID-19.  Outpatient she had received hydroxychloroquine without clinical improvement.  Even was seen in the ER on 2/5 with diarrhea when she received IV fluids and inhaler but then discharged home.  Despite of going home she progressively worsened therefore came to the hospital.   Subjective: No acute issues or events overnight, patient continues to complain of moderate dyspnea with exertion. Patient declines chest pain, nausea, vomiting, diarrhea, constipation, headache, fevers, chills.  Assessment & Plan:   Principal Problem:   Acute respiratory disease due to COVID-19 virus Active Problems:   Dyslipidemia   PAROXYSMAL ATRIAL FIBRILLATION  Acute hypoxic respiratory distress secondary to COVID-19 pneumonia, improved COVID-19 Labs Recent Labs    01/15/20 0420 01/16/20 0300 01/17/20 0245  DDIMER 1.13* 0.90* 0.77*  FERRITIN 213 236 164  LDH 261* 225* 224*  CRP 12.8* 6.8* 4.1*  - Able to continue on room air with exertion - symptomatic dyspnea without hypoxia - Remdesivir - 01/14/20-->01/18/20 - Decadron - stop date 01/23/20 - Actemra - none given elevated procalcitonin and resolving CRP - Convalescent plasma-none - Antibiotics-none - Chest x-ray-bilateral infiltrate/opacifications consistent with COVID - Supportive care-antitussive, inhalers, I-S/flutter - CODE STATUS confirmed - Vitamin C & Zinc. Prone >16hrs/day.  - Routine: Labs have been reviewed including ferritin, LDH, CRP, d-dimer, fibrinogen.  Will need to trend this lab daily.  UTI ruled out - Symptoms resolving without treatment, UA unremarkable  Paroxysmal atrial fibrillation, rate controlled. - Continue Xarelto - Patient remains rate  controlled, of note she is not on any rate control medications at home  Hyperlipidemia - Continue home simvastatin  DVT prophylaxis: Xarelto Code Status: Full code Disposition Plan:  Patient From= home Patient Anticipated D/C place= home likely next 24 to 48 hours Barriers= ongoing dyspnea with exertion, transient hypoxia, completion of Remdesivir  Consultants:   None  Procedures:   None  Examination:  General:  Pleasantly resting in bed, No acute distress. HEENT:  Normocephalic atraumatic.  Sclerae nonicteric, noninjected.  Extraocular movements intact bilaterally. Neck:  Without mass or deformity.  Trachea is midline. Lungs:  Clear to auscultate bilaterally without rhonchi, wheeze, or rales. Heart:  Regular rate and rhythm.  Without murmurs, rubs, or gallops. Abdomen:  Soft, nontender, nondistended.  Without guarding or rebound. Extremities: Without cyanosis, clubbing, edema, or obvious deformity. Vascular:  Dorsalis pedis and posterior tibial pulses palpable bilaterally. Skin:  Warm and dry, no erythema, no ulcerations.   Objective: Vitals:   01/16/20 1510 01/16/20 1930 01/17/20 0400 01/17/20 0714  BP: (!) 144/59 140/71 135/71 (!) 157/73  Pulse: 78 66 66 62  Resp: 19 20 18 18   Temp: 98 F (36.7 C) 98.1 F (36.7 C) 98 F (36.7 C) 97.9 F (36.6 C)  TempSrc: Oral Oral Oral Oral  SpO2: 97% 96% 95% 92%  Weight:      Height:        Intake/Output Summary (Last 24 hours) at 01/17/2020 0743 Last data filed at 01/17/2020 0715 Gross per 24 hour  Intake 1840 ml  Output -  Net 1840 ml   Filed Weights   01/14/20 0447 01/14/20 2200  Weight: 52.2 kg 53.5 kg   Data Reviewed:   CBC: Recent Labs  Lab 01/11/20 1130 01/14/20 0520 01/15/20 0420 01/16/20 0300 01/17/20 0245  WBC 8.2 8.1 6.0 7.9 8.1  NEUTROABS  --  6.8  --   --   --   HGB 12.3 11.4* 10.6* 10.4* 10.0*  HCT 35.9* 33.3* 29.5* 27.8* 28.2*  MCV 91.8 91.5 91.3 93.0 94.3  PLT 247 257 290 352 [id number]   Basic  Metabolic Panel: Recent Labs  Lab 01/11/20 1130 01/14/20 0520 01/15/20 0420 01/16/20 0300 01/17/20 0245  NA 127* 129* 133* 134* 135  K 3.7 3.7 3.6 3.8 4.2  CL 92* 95* 98 97* 97*  CO2 25 23 26 28 28   GLUCOSE 124* 125* 138* 179* 142*  BUN 5* 5* 13 25* 20  CREATININE 0.66 0.55 0.57 0.58 0.58  CALCIUM 8.6* 8.0* 8.4* 8.5* 8.4*  MG  --   --  2.1 2.1 2.1  PHOS  --   --  3.2 2.7 3.5   GFR: Estimated Creatinine Clearance: 51.8 mL/min (by C-G formula based on SCr of 0.58 mg/dL).   Liver Function Tests: Recent Labs  Lab 01/11/20 1130 01/14/20 0520 01/15/20 0420 01/16/20 0300 01/17/20 0245  AST 23 37 43* 39 32  ALT 18 28 38 44 36  ALKPHOS 86 75 66 77 65  BILITOT 0.6 0.4 0.6 0.4 0.4  PROT 6.4* 5.9* 6.0* 5.8* 5.4*  ALBUMIN 3.4* 2.6* 2.7* 2.6* 2.5*   Lipid Profile: No results for input(s): CHOL, HDL, LDLCALC, TRIG, CHOLHDL, LDLDIRECT in the last 72 hours. Sepsis Labs: Recent Labs  Lab 01/14/20 0520 01/14/20 0528  PROCALCITON 0.57  --   LATICACIDVEN  --  1.4    Recent Results (from the past 240 hour(s))  Blood Culture (routine x 2)     Status: None (Preliminary result)   Collection Time: 01/14/20  5:06 AM   Specimen: BLOOD RIGHT HAND  Result Value Ref Range Status   Specimen Description BLOOD RIGHT HAND  Final   Special Requests   Final    BOTTLES DRAWN AEROBIC AND ANAEROBIC Blood Culture results may not be optimal due to an inadequate volume of blood received in culture bottles   Culture   Final    NO GROWTH 2 DAYS Performed at [facility], [place] [address] [place], [place] [address]    Report Status PENDING  Incomplete  Blood Culture (routine x 2)     Status: None (Preliminary result)   Collection Time: 01/14/20  5:20 AM   Specimen: BLOOD  Result Value Ref Range Status   Specimen Description BLOOD RIGHT ANTECUBITAL  Final   Special Requests   Final    BOTTLES DRAWN AEROBIC AND ANAEROBIC Blood Culture results may not be optimal due to an inadequate volume of  blood received in culture bottles   Culture   Final    NO GROWTH 2 DAYS Performed at [facility], [place] [address] [place], [place] [address]    Report Status PENDING  Incomplete  Respiratory Panel by RT PCR (Flu A&B, Covid) - Nasopharyngeal Swab     Status: Abnormal   Collection Time: 01/14/20  9:13 AM   Specimen: Nasopharyngeal Swab  Result Value Ref Range Status   SARS Coronavirus 2 by RT PCR POSITIVE (A) NEGATIVE Final    Comment: RESULT CALLED TO, READ BACK BY AND VERIFIED WITH: Alesia Banda RN 10:20 01/14/20 ([email or web address]) (NOTE) SARS-CoV-2 target nucleic acids are DETECTED. SARS-CoV-2 RNA is generally detectable in upper respiratory specimens  during the acute phase of infection. Positive results are indicative of the presence of the identified virus, but do not  rule out bacterial infection or co-infection with other pathogens not detected by the test. Clinical correlation with patient history and other diagnostic information is necessary to determine patient infection status. The expected result is Negative. Fact Sheet for Patients:  PinkCheek.be Fact Sheet for Healthcare Providers: GravelBags.it This test is not yet approved or cleared by the [place] FDA and  has been authorized for detection and/or diagnosis of SARS-CoV-2 by FDA under an Emergency Use Authorization (EUA).  This EUA will remain in effect (meaning this test can be used) f or the duration of  the COVID-19 declaration under Section 564(b)(1) of the Act, 21 U.S.C. section 360bbb-3(b)(1), unless the authorization is terminated or revoked sooner.    Influenza A by PCR NEGATIVE NEGATIVE Final   Influenza B by PCR NEGATIVE NEGATIVE Final    Comment: (NOTE) The Xpert Xpress SARS-CoV-2/FLU/RSV assay is intended as an aid in  the diagnosis of influenza from Nasopharyngeal swab specimens and  should not be used as a sole basis for treatment. Nasal washings  and  aspirates are unacceptable for Xpert Xpress SARS-CoV-2/FLU/RSV  testing. Fact Sheet for Patients: PinkCheek.be Fact Sheet for Healthcare Providers: GravelBags.it This test is not yet approved or cleared by the [place] FDA and  has been authorized for detection and/or diagnosis of SARS-CoV-2 by  FDA under an Emergency Use Authorization (EUA). This EUA will remain  in effect (meaning this test can be used) for the duration of the  Covid-19 declaration under Section 564(b)(1) of the Act, 21  U.S.C. section 360bbb-3(b)(1), unless the authorization is  terminated or revoked. Performed at [facility], [place] [address], [place], [place] [address]   Urine culture     Status: Abnormal   Collection Time: 01/14/20 12:28 PM   Specimen: Urine, Random  Result Value Ref Range Status   Specimen Description URINE, RANDOM  Final   Special Requests   Final    NONE Performed at [facility], [place] [address], [place], [place] [address]    Culture MULTIPLE SPECIES PRESENT, SUGGEST RECOLLECTION (A)  Final   Report Status 01/15/2020 FINAL  Final     Radiology Studies: No results found.  Scheduled Meds: . vitamin C  500 mg Oral Daily  . dexamethasone (DECADRON) injection  6 mg Intravenous Q24H  . famotidine  10 mg Oral BID  . feeding supplement (ENSURE ENLIVE)  237 mL Oral TID BM  . Ipratropium-Albuterol  1 puff Inhalation Q6H  . loratadine  10 mg Oral Daily  . multivitamin  1 tablet Oral BID  . rivaroxaban  20 mg Oral Q supper  . simvastatin  20 mg Oral QHS  . sodium chloride flush  3 mL Intravenous Q12H  . sodium chloride flush  3 mL Intravenous Q12H  . zinc sulfate  220 mg Oral Daily   Continuous Infusions: . sodium chloride    . remdesivir 100 mg in NS 100 mL Stopped (01/16/20 1035)     LOS: 3 days   Time spent= 25 mins  Little Ishikawa, DO [facility]  If 7PM-7AM, please contact  night-coverage  01/17/2020, 7:43 AM

## 2020-01-17 NOTE — Progress Notes (Signed)
Occupational Therapy Treatment Patient Details Name: Vicki [name] MRN: [id number] DOB: January 18, 1946 Today's Date: 01/17/2020    History of present illness 74 year old female admitted with Covid; PMH includes remote breast cancer s/p radiation. According to the chart her son insisted that she take hydroxychlorquine prior to her being admitted-- but there was no improvement. She also has diverticulosis, DJD, GERD ,AFIB and osteopenia.   OT comments  Pt continues to make progress in therapy, however more dizzy with standing tasks this date. Pt (+) for orthostatic hypotension (see BP readings below). RN and MD notified. Educated/instructed pt on level 1 theraband HEP with pt requiring min cues and demo on technique. Educated/instructed pt on standing therapeutic exercise HEP with good understanding and follow through. Pt tolerated standing 1 x 5 min during activity, however had to sit due to fatigue and dizziness. Educated/instructed pt on safety strategies and fall prevention techniques with good understanding. All questions/concerns answered at this time. OT will continue to follow acutely.   BP sitting: 115/43mmHg.  BP standing: 94/74mmHg.  BP end of session: 114/65mmHg.   Follow Up Recommendations  No OT follow up;Supervision - Intermittent    Equipment Recommendations  None recommended by OT    Recommendations for Other Services      Precautions / Restrictions Precautions Precautions: Fall;Other (comment) Precaution Comments: Monitor SpO2 and BP. (+) orthostatic hypotension Restrictions Weight Bearing Restrictions: No       Mobility Bed Mobility               General bed mobility comments: Pt seated in bedside chair upon OT arrival.  Transfers Overall transfer level: Needs assistance Equipment used: None Transfers: Sit to/from Stand;Stand Pivot Transfers Sit to Stand: Supervision Stand pivot transfers: Supervision       General transfer comment: To ensure  balance and safety    Balance Overall balance assessment: Mild deficits observed, not formally tested                                         ADL either performed or assessed with clinical judgement   ADL                                               Vision       Perception     Praxis      Cognition Arousal/Alertness: Awake/alert Behavior During Therapy: WFL for tasks assessed/performed Overall Cognitive Status: Within Functional Limits for tasks assessed                                 General Comments: Pt very motivated to participate in therapy.         Exercises Exercises: Other exercises Other Exercises Other Exercises: Educated/instructed pt on level 1 therband HEP Other Exercises: Educated/instructed pt on standing ther ex HEP   Shoulder Instructions       General Comments Pt on room air with SpO2 maintaining in 90s throughout. Pt reporting dizziness in standing with pt (+) orthostatic hypotension.     Pertinent Vitals/ Pain       Pain Assessment: No/denies pain  Home Living  Prior Functioning/Environment              Frequency           Progress Toward Goals  OT Goals(current goals can now be found in the care plan section)  Progress towards OT goals: Progressing toward goals  ADL Goals Pt/caregiver will Perform Home Exercise Program: Increased strength;Both right and left upper extremity;With theraband;Independently;With written HEP provided Additional ADL Goal #1: Pt to complete ADLs independently with SpO2 maintaining in the 90s. Additional ADL Goal #2: Pt to tolerate standing up to 10 min independently, in preparation for ADLs. Additional ADL Goal #3: Pt to recall and verbalize 3 energy conservation strategies with 0 verbal cues.  Plan Discharge plan remains appropriate    Co-evaluation                 AM-PAC OT "6  Clicks" Daily Activity     Outcome Measure   Help from another person eating meals?: None Help from another person taking care of personal grooming?: A Little Help from another person toileting, which includes using toliet, bedpan, or urinal?: A Little Help from another person bathing (including washing, rinsing, drying)?: A Little Help from another person to put on and taking off regular upper body clothing?: None Help from another person to put on and taking off regular lower body clothing?: A Little 6 Click Score: 20    End of Session    OT Visit Diagnosis: Unsteadiness on feet (R26.81);Muscle weakness (generalized) (M62.81)   Activity Tolerance Patient limited by fatigue;Other (comment)(Limited by dizziness)   Patient Left in chair;with call bell/phone within reach   Nurse Communication Mobility status        Time: [id number] OT Time Calculation (min): 38 min  Charges: OT General Charges $OT Visit: 1 Visit OT Treatments $Therapeutic Activity: 8-22 mins $Therapeutic Exercise: 23-37 mins  Mauri Brooklyn OTR/L [phone]   Mauri Brooklyn 01/17/2020, 9:52 AM

## 2020-01-17 NOTE — Plan of Care (Signed)

## 2020-01-17 NOTE — Progress Notes (Signed)
Physical Therapy Treatment Patient Details Name: Vicki [name] MRN: [id number] DOB: June 28, 1946 Today's Date: 01/17/2020    History of Present Illness 74 year old female admitted with Covid; PMH includes remote breast cancer s/p radiation. According to the chart her son insisted that she take hydroxychlorquine prior to her being admitted-- but there was no improvement. She also has diverticulosis, DJD, GERD ,AFIB and osteopenia.    PT Comments    Patient in bed when PT arrived- but wanted to get up, walk to the bathroom then continue to sit up in BS chair. She stated she was very tired after using the bathroom, but did not feel dizzy and overall was feeling so much better. Indicates that she might be discharged to home by this weekend. Reviewed IS, and Flutter Valve unit- very motivated and good return demo. Reminded to perform the breathing ex at home too, and also to continue to perform the LE ex for strengthening. Should continue to benefit from PT the remainder of her hospitalization.  Follow Up Recommendations  No PT follow up     Equipment Recommendations       Recommendations for Other Services       Precautions / Restrictions Precautions Precautions: Fall Precaution Comments: Monitor SpO2 and BP. (+) orthostatic hypotension Restrictions Weight Bearing Restrictions: No    Mobility  Bed Mobility Overal bed mobility: Independent             General bed mobility comments: Pt seated in bedside chair upon PT arrival.  Transfers Overall transfer level: Needs assistance Equipment used: None Transfers: Sit to/from Stand;Stand Pivot Transfers Sit to Stand: Supervision Stand pivot transfers: Supervision       General transfer comment: To ensure balance and safety  Ambulation/Gait Ambulation/Gait assistance: Supervision Gait Distance (Feet): 31 Feet(states she has also been going to the bathroom by herself, walking around in the room to the window, etc) Assistive  device: None Gait Pattern/deviations: Narrow base of support;Shuffle   Gait velocity interpretation: 1.31 - 2.62 ft/sec, indicative of limited community Metallurgist Rankin (Stroke Patients Only)       Balance Overall balance assessment: Mild deficits observed, not formally tested                                          Cognition Arousal/Alertness: Awake/alert Behavior During Therapy: WFL for tasks assessed/performed Overall Cognitive Status: Within Functional Limits for tasks assessed                                 General Comments: Pt very motivated to participate in therapy. Ambulated to the bathroom today during PT. No S/S of orthostasis      Exercises Other Exercises Other Exercises: reminded to perform LE ex at least twice daily- and discussed pacing self as she returns to her routine ADLs at home. Other Exercises: Reminded to perform IS and Flutter Valve unit in between sessions and continue at home= good return demos today    General Comments General comments (skin integrity, edema, etc.): She is currently on room air. States that she felt tired after she went to the bathroom- no dizziness- did tend to touch surfaces while ambulating. Relates that she hopes  to go home by the weekend      Pertinent Vitals/Pain Pain Assessment: No/denies pain    Home Living                      Prior Function            PT Goals (current goals can now be found in the care plan section) Acute Rehab PT Goals Patient Stated Goal: To go home PT Goal Formulation: With patient Time For Goal Achievement: 01/29/20 Potential to Achieve Goals: Good Progress towards PT goals: Progressing toward goals    Frequency    Min 3X/week      PT Plan      Co-evaluation              AM-PAC PT "6 Clicks" Mobility   Outcome Measure  Help needed turning from your back to your  side while in a flat bed without using bedrails?: None Help needed moving from lying on your back to sitting on the side of a flat bed without using bedrails?: None Help needed moving to and from a bed to a chair (including a wheelchair)?: A Little Help needed standing up from a chair using your arms (e.g., wheelchair or bedside chair)?: A Little Help needed to walk in hospital room?: A Little Help needed climbing 3-5 steps with a railing? : A Little 6 Click Score: 20    End of Session   Activity Tolerance: Patient tolerated treatment well Patient left: in chair;with call bell/phone within reach Nurse Communication: Mobility status PT Visit Diagnosis: Muscle weakness (generalized) (M62.81);Unsteadiness on feet (R26.81)     Time: [id number] PT Time Calculation (min) (ACUTE ONLY): 30 min  Charges:  $Gait Training: 8-22 mins $Therapeutic Exercise: 8-22 mins                    Rollen Sox, PT # [phone] CGV cell   Casandra Doffing 01/17/2020, 5:01 PM

## 2020-01-18 LAB — CBC
HCT: 28.9 % — ABNORMAL LOW (ref 36.0–46.0)
Hemoglobin: 10 g/dL — ABNORMAL LOW (ref 12.0–15.0)
MCH: 32.2 pg (ref 26.0–34.0)
MCHC: 34.6 g/dL (ref 30.0–36.0)
MCV: 92.9 fL (ref 80.0–100.0)
Platelets: 384 10*3/uL (ref 150–400)
RBC: 3.11 MIL/uL — ABNORMAL LOW (ref 3.87–5.11)
RDW: 12.7 % (ref 11.5–15.5)
WBC: 8.4 10*3/uL (ref 4.0–10.5)
nRBC: 0 % (ref 0.0–0.2)

## 2020-01-18 LAB — C-REACTIVE PROTEIN: CRP: 3.4 mg/dL — ABNORMAL HIGH (ref <1.0)

## 2020-01-18 LAB — LACTATE DEHYDROGENASE: LDH: 227 U/L — ABNORMAL HIGH (ref 98–192)

## 2020-01-18 LAB — FERRITIN: Ferritin: 160 ng/mL (ref 11–307)

## 2020-01-18 LAB — D-DIMER, QUANTITATIVE: D-Dimer, Quant: 0.67 ug/mL-FEU — ABNORMAL HIGH (ref 0.00–0.50)

## 2020-01-18 LAB — PHOSPHORUS: Phosphorus: 3.7 mg/dL (ref 2.5–4.6)

## 2020-01-18 LAB — MAGNESIUM: Magnesium: 2.1 mg/dL (ref 1.7–2.4)

## 2020-01-18 MED ORDER — PREDNISONE 10 MG PO TABS: ORAL_TABLET | ORAL | 0 refills | Status: AC

## 2020-01-18 NOTE — Plan of Care (Signed)

## 2020-01-18 NOTE — Plan of Care (Signed)

## 2020-01-18 NOTE — Care Management Important Message (Signed)
Important Message  Patient Details  Name: Vicki [name] MRN: [id number] Date of Birth: Feb 07, 1946   Medicare Important Message Given:  Yes - Important Message mailed due to current National Emergency  Verbal consent obtained due to current National Emergency    Contact Name: Hilal Granda Call Date: 01/18/20  Time: 1055 Phone: [phone] Outcome: No Answer/Busy Important Message mailed to: Patient address on file    Delorse Lek 01/18/2020, 10:55 AM

## 2020-01-18 NOTE — Progress Notes (Addendum)
0745: Assumed care of Pt from night RN. Pt alert, sitting comfortably up to chair, denies pain, VSS SpO2 96% on room air. Plan for discharge today after last dose Remdesivir. Otherwise denies needs at this time, will monitor  1025: Discharge education provided to Pt and all questions answered. PIV's removed and Pt dressed for discharge. Called and informed spouse to head this way.

## 2020-01-18 NOTE — Progress Notes (Signed)
Physical Therapy Treatment Patient Details Name: Vicki [name] MRN: [id number] DOB: 20-May-1946 Today's Date: 01/18/2020    History of Present Illness 74 year old female with COVID. Hydroxychlorquine administered outpatient but no improvement so patient to the hospital. PMH: remote breast cancer s/p radiation, diverticulosis, DJD, GERD, Afib, osteopenia. Patient is on room air now and is being discharge home today.    PT Comments    Patient being discharged home today. She lives with her husband. Oxygen saturation stable on room air during session. Grossly greater than or equal to 90% (briefly 87 or 88% but not sustained). Education on home safety and removing throw rugs if any, having adequate lighting, watching out for her cats while ambulating. Patient verbalized understanding. Her husband will be at home to assist as needed.    Follow Up Recommendations  No PT follow up     Equipment Recommendations  None recommended by PT(patient has shower chair at home)       Precautions / Restrictions Precautions Precautions: Fall           Transfers Overall transfer level: Independent Equipment used: None Transfers: Sit to/from Stand              Ambulation/Gait Ambulation/Gait assistance: Supervision;Modified independent (Device/Increase time) Gait Distance (Feet): 150 Feet(20) Assistive device: None Gait Pattern/deviations: (slowed gait speed)     General Gait Details: one near misstep toward right that patient self corrected with stepping response   Stairs Stairs: Yes Stairs assistance: Supervision Stair Management: (flat hand on wall/counter to mimic wall near stairs to enter) Number of Stairs: 3 General stair comments: Patient has one step into the sunroom and 2 additional steps into the main living area of the home. No rails but reports she can hold the wall. Light support with hand flat on wall/counter during single step stair negotiation in room x 3  trials.   Wheelchair Mobility    Modified Rankin (Stroke Patients Only)       Balance Overall balance assessment: Mild deficits observed, not formally tested;Modified Independent(patient able to self correct, dec speed to increase safety)          Cognition Arousal/Alertness: Awake/alert Behavior During Therapy: WFL for tasks assessed/performed Overall Cognitive Status: Within Functional Limits for tasks assessed      Dynamic standing balance sitting and standing for donning her personal clothes at end of session.        Exercises Other Exercises Other Exercises: incentive spirometer (high of 1125) and flutter valve x 10 reps    General Comments General comments (skin integrity, edema, etc.): Patient denies having any concerns about discharging home today. She reports her husband is well (does not have COVID) and can assist with IADLs initially such as laundry and cleaning.      Pertinent Vitals/Pain Pain Assessment: No/denies pain(No complaints of pain. No signs/symptoms of pain.)           PT Goals (current goals can now be found in the care plan section) Acute Rehab PT Goals Patient Stated Goal: to go home Progress towards PT goals: Goals met and updated - see care plan       PT Plan Current plan remains appropriate       AM-PAC PT "6 Clicks" Mobility   Outcome Measure  Help needed turning from your back to your side while in a flat bed without using bedrails?: None Help needed moving from lying on your back to sitting on the side of a flat bed without  using bedrails?: None Help needed moving to and from a bed to a chair (including a wheelchair)?: None Help needed standing up from a chair using your arms (e.g., wheelchair or bedside chair)?: None Help needed to walk in hospital room?: None Help needed climbing 3-5 steps with a railing? : A Little 6 Click Score: 23    End of Session   Activity Tolerance: Patient tolerated treatment well Patient left:  in chair;with nursing/sitter in room Nurse Communication: Mobility status PT Visit Diagnosis: Other abnormalities of gait and mobility (R26.89);Muscle weakness (generalized) (M62.81)     Time: 9381-0175 PT Time Calculation (min) (ACUTE ONLY): 27 min  Charges:  $Gait Training: 23-37 mins                     Birdie Hopes, DPT, PT Acute Rehab [phone]     Birdie Hopes 01/18/2020, 10:23 AM

## 2020-01-18 NOTE — Progress Notes (Signed)
SATURATION QUALIFICATIONS: (This note is used to comply with regulatory documentation for home oxygen)  Patient Saturations on Room Air at Rest = 96%  Patient Saturations on Room Air while Ambulating = 90% (1 instance 87% not sustained, 1 instance 88% not sustained)  Patient Saturations on  Liters of oxygen while Ambulating = N/A, stable on room air  Please briefly explain why patient needs home oxygen: Patient does not need home oxygen.

## 2020-01-18 NOTE — Discharge Summary (Signed)
Physician Discharge Summary  Vicki [name] IOE:[id number] DOB: 1946-01-10 DOA: 01/14/2020  PCP: Cassandria Anger, MD  Admit date: 01/14/2020 Discharge date: 01/18/2020  Admitted From: Home Disposition: Home  Recommendations for Outpatient Follow-up:  1. Follow up with PCP in 1-2 weeks 2. Please obtain BMP/CBC in one week  Discharge Condition: Stable CODE STATUS: Full Diet recommendation: As tolerated  Brief/Interim Summary: 74 year old with history of breast cancer status post radiation, atrial fibrillation, hyperlipidemia presented with shortness of breath secondary to COVID-19.  Outpatient she had received hydroxychloroquine without clinical improvement.  Even was seen in the ER on 2/5 with diarrhea when she received IV fluids and inhaler but then discharged home.  Despite of going home she progressively worsened therefore came to the hospital.  Patient met as above with acute hypoxic respiratory failure in the setting of COVID-19 pneumonia.  Patient treated with remdesivir and dexamethasone with marked improvement over the past 48 hours.  Patient now ambulating without hypoxia or symptomatology.  Patient otherwise stable and agreeable for discharge home, close follow-up with PCP in the next 1 to 2 weeks as discussed. Will need to quarantine for 21 days after covid swab as discussed.  Discharge Diagnoses:  Principal Problem:   Acute respiratory disease due to COVID-19 virus Active Problems:   Dyslipidemia   PAROXYSMAL ATRIAL FIBRILLATION    Discharge Instructions  Discharge Instructions    Call MD for:  difficulty breathing, headache or visual disturbances   Complete by: As directed    Call MD for:  extreme fatigue   Complete by: As directed    Call MD for:  hives   Complete by: As directed    Call MD for:  persistant dizziness or light-headedness   Complete by: As directed    Call MD for:  persistant nausea and vomiting   Complete by: As directed    Call MD for:   severe uncontrolled pain   Complete by: As directed    Call MD for:  temperature >100.4   Complete by: As directed    Diet - low sodium heart healthy   Complete by: As directed    Increase activity slowly   Complete by: As directed      Allergies as of 01/18/2020      Reactions   Codeine    REACTION: unknown      Medication List    TAKE these medications   acetaminophen 325 MG tablet Commonly known as: TYLENOL Take 500 mg by mouth every 6 (six) hours as needed for mild pain or fever.   cholecalciferol 1000 units tablet Commonly known as: VITAMIN D Take 1,000 Units by mouth 2 (two) times daily.   famotidine 10 MG tablet Commonly known as: PEPCID Take 10 mg by mouth 2 (two) times daily.   ICAPS Tabs Take 1 tablet by mouth 2 (two) times daily.   loratadine 10 MG tablet Commonly known as: CLARITIN Take 10 mg by mouth daily.   Melatonin 3 MG Tabs Take 3 mg by mouth at bedtime as needed (for sleep).   ondansetron 4 MG tablet Commonly known as: ZOFRAN Take 1 tablet (4 mg total) by mouth every 6 (six) hours.   predniSONE 10 MG tablet Commonly known as: DELTASONE Take 4 tablets (40 mg total) by mouth daily for 3 days, THEN 3 tablets (30 mg total) daily for 3 days, THEN 2 tablets (20 mg total) daily for 3 days, THEN 1 tablet (10 mg total) daily for 3 days. Start taking  on: January 18, 2020   simvastatin 20 MG tablet Commonly known as: ZOCOR TAKE 1 TABLET BY MOUTH AT BEDTIME   Xarelto 20 MG Tabs tablet Generic drug: rivaroxaban Take 1 tablet by mouth once daily What changed:   how much to take  when to take this       Allergies  Allergen Reactions  . Codeine     REACTION: unknown   Procedures/Studies: DG Chest Port 1 View  Result Date: 01/14/2020 CLINICAL DATA:  74 year old female with COVID-19 diagnosed late last month. Shortness of breath. EXAM: PORTABLE CHEST 1 VIEW COMPARISON:  Portable chest 01/11/2020 and earlier. FINDINGS: Portable AP semi upright  view at 0600 hours. Increasing widespread and confluent bilateral mid and lower lung opacity. Upper lungs seem to remain spared. Lower lung volumes. No pleural effusion. Stable cardiac size and mediastinal contours. Visualized tracheal air column is within normal limits. No acute osseous abnormality identified. IMPRESSION: Progressive bilateral COVID-19 pneumonia since 01/11/2020. Electronically Signed   By: Genevie Ann M.D.   On: 01/14/2020 06:11   DG Chest Portable 1 View  Result Date: 01/11/2020 CLINICAL DATA:  Shortness of breath and EXAM: PORTABLE CHEST 1 VIEW COMPARISON:  October 30, 2018 FINDINGS: The lungs are hyperinflated. Mild, stable chronic appearing increased interstitial lung markings are noted. There is no evidence of acute infiltrate, pleural effusion or pneumothorax. The heart size and mediastinal contours are within normal limits. The visualized skeletal structures are unremarkable. IMPRESSION: Stable exam without evidence of acute or active cardiopulmonary disease. Electronically Signed   By: Virgina Norfolk M.D.   On: 01/11/2020 15:09     Subjective: No acute issues or events overnight, requesting discharge home which is certainly reasonable given marked improvement over the past 48 hours.   Discharge Exam: Vitals:   01/18/20 0025 01/18/20 0410  BP: (!) 148/68 129/60  Pulse: 67 (!) 57  Resp: 18 18  Temp: 98.9 F (37.2 C) 98.3 F (36.8 C)  SpO2: 98% 96%   Vitals:   01/17/20 1945 01/17/20 2100 01/18/20 0025 01/18/20 0410  BP: (!) 125/52  (!) 148/68 129/60  Pulse: 63  67 (!) 57  Resp: _0 Temp: 100.2 F (37.9 C) 98.1 F (36.7 C) 98.9 F (37.2 C) 98.3 F (36.8 C)  TempSrc: Oral Oral Oral Oral  SpO2: 91%  98% 96%  Weight:      Height:        General:  Pleasantly resting in bed, No acute distress. HEENT:  Normocephalic atraumatic.  Sclerae nonicteric, noninjected.  Extraocular movements intact bilaterally. Neck:  Without mass or deformity.  Trachea is  midline. Lungs:  Clear to auscultate bilaterally without rhonchi, wheeze, or rales. Heart:  Regular rate and rhythm.  Without murmurs, rubs, or gallops. Abdomen:  Soft, nontender, nondistended.  Without guarding or rebound. Extremities: Without cyanosis, clubbing, edema, or obvious deformity. Vascular:  Dorsalis pedis and posterior tibial pulses palpable bilaterally. Skin:  Warm and dry, no erythema, no ulcerations.   The results of significant diagnostics from this hospitalization (including imaging, microbiology, ancillary and laboratory) are listed below for reference.     Microbiology: Recent Results (from the past 240 hour(s))  Blood Culture (routine x 2)     Status: None (Preliminary result)   Collection Time: 01/14/20  5:06 AM   Specimen: BLOOD RIGHT HAND  Result Value Ref Range Status   Specimen Description BLOOD RIGHT HAND  Final   Special Requests   Final    BOTTLES DRAWN  AEROBIC AND ANAEROBIC Blood Culture results may not be optimal due to an inadequate volume of blood received in culture bottles   Culture   Final    NO GROWTH 3 DAYS Performed at [facility], [place] [address], [place], [place] [address]    Report Status PENDING  Incomplete  Blood Culture (routine x 2)     Status: None (Preliminary result)   Collection Time: 01/14/20  5:20 AM   Specimen: BLOOD  Result Value Ref Range Status   Specimen Description BLOOD RIGHT ANTECUBITAL  Final   Special Requests   Final    BOTTLES DRAWN AEROBIC AND ANAEROBIC Blood Culture results may not be optimal due to an inadequate volume of blood received in culture bottles   Culture   Final    NO GROWTH 3 DAYS Performed at [facility], [place] [address] [place], [place] [address]    Report Status PENDING  Incomplete  Respiratory Panel by RT PCR (Flu A&B, Covid) - Nasopharyngeal Swab     Status: Abnormal   Collection Time: 01/14/20  9:13 AM   Specimen: Nasopharyngeal Swab  Result Value Ref Range Status   SARS  Coronavirus 2 by RT PCR POSITIVE (A) NEGATIVE Final    Comment: RESULT CALLED TO, READ BACK BY AND VERIFIED WITH: Alesia Banda RN 10:20 01/14/20 ([email or web address]) (NOTE) SARS-CoV-2 target nucleic acids are DETECTED. SARS-CoV-2 RNA is generally detectable in upper respiratory specimens  during the acute phase of infection. Positive results are indicative of the presence of the identified virus, but do not rule out bacterial infection or co-infection with other pathogens not detected by the test. Clinical correlation with patient history and other diagnostic information is necessary to determine patient infection status. The expected result is Negative. Fact Sheet for Patients:  PinkCheek.be Fact Sheet for Healthcare Providers: GravelBags.it This test is not yet approved or cleared by the [place] FDA and  has been authorized for detection and/or diagnosis of SARS-CoV-2 by FDA under an Emergency Use Authorization (EUA).  This EUA will remain in effect (meaning this test can be used) f or the duration of  the COVID-19 declaration under Section 564(b)(1) of the Act, 21 U.S.C. section 360bbb-3(b)(1), unless the authorization is terminated or revoked sooner.    Influenza A by PCR NEGATIVE NEGATIVE Final   Influenza B by PCR NEGATIVE NEGATIVE Final    Comment: (NOTE) The Xpert Xpress SARS-CoV-2/FLU/RSV assay is intended as an aid in  the diagnosis of influenza from Nasopharyngeal swab specimens and  should not be used as a sole basis for treatment. Nasal washings and  aspirates are unacceptable for Xpert Xpress SARS-CoV-2/FLU/RSV  testing. Fact Sheet for Patients: PinkCheek.be Fact Sheet for Healthcare Providers: GravelBags.it This test is not yet approved or cleared by the [place] FDA and  has been authorized for detection and/or diagnosis of SARS-CoV-2 by  FDA under an  Emergency Use Authorization (EUA). This EUA will remain  in effect (meaning this test can be used) for the duration of the  Covid-19 declaration under Section 564(b)(1) of the Act, 21  U.S.C. section 360bbb-3(b)(1), unless the authorization is  terminated or revoked. Performed at [facility], [place] [address], [place], [place] [address]   Urine culture     Status: Abnormal   Collection Time: 01/14/20 12:28 PM   Specimen: Urine, Random  Result Value Ref Range Status   Specimen Description URINE, RANDOM  Final   Special Requests   Final  NONE Performed at [facility], [place] [address] [place], [place] [address]    Culture MULTIPLE SPECIES PRESENT, SUGGEST RECOLLECTION (A)  Final   Report Status 01/15/2020 FINAL  Final     Labs: BNP (last 3 results) Recent Labs    01/14/20 0520 01/15/20 0420  BNP 357.4* 948.0*   Basic Metabolic Panel: Recent Labs  Lab 01/11/20 1130 01/14/20 0520 01/15/20 0420 01/16/20 0300 01/17/20 0245 01/18/20 0153  NA 127* 129* 133* 134* 135  --   K 3.7 3.7 3.6 3.8 4.2  --   CL 92* 95* 98 97* 97*  --   CO2 _0 --   GLUCOSE 124* 125* 138* 179* 142*  --   BUN 5* 5* 13 25* 20  --   CREATININE 0.66 0.55 0.57 0.58 0.58  --   CALCIUM 8.6* 8.0* 8.4* 8.5* 8.4*  --   MG  --   --  2.1 2.1 2.1 2.1  PHOS  --   --  3.2 2.7 3.5 3.7   Liver Function Tests: Recent Labs  Lab 01/11/20 1130 01/14/20 0520 01/15/20 0420 01/16/20 0300 01/17/20 0245  AST 23 37 43* 39 32  ALT 18 28 38 44 36  ALKPHOS 86 75 66 77 65  BILITOT 0.6 0.4 0.6 0.4 0.4  PROT 6.4* 5.9* 6.0* 5.8* 5.4*  ALBUMIN 3.4* 2.6* 2.7* 2.6* 2.5*   Recent Labs  Lab 01/11/20 1130  LIPASE 26   No results for input(s): AMMONIA in the last 168 hours. CBC: Recent Labs  Lab 01/14/20 0520 01/15/20 0420 01/16/20 0300 01/17/20 0245 01/18/20 0153  WBC 8.1 6.0 7.9 8.1 8.4  NEUTROABS 6.8  --   --   --   --   HGB 11.4* 10.6* 10.4* 10.0* 10.0*  HCT 33.3* 29.5* 27.8* 28.2*  28.9*  MCV 91.5 91.3 93.0 94.3 92.9  PLT 257 290 352 365 384   D-Dimer Recent Labs    01/17/20 0245 01/18/20 0153  DDIMER 0.77* 0.67*   Anemia work up Recent Labs    01/17/20 0245 01/18/20 0153  FERRITIN 164 160   Urinalysis    Component Value Date/Time   COLORURINE YELLOW 01/15/2020 [place] 01/15/2020 1349   LABSPEC 1.021 01/15/2020 1349   PHURINE 6.0 01/15/2020 1349   GLUCOSEU >=500 (A) 01/15/2020 1349   GLUCOSEU NEGATIVE 07/10/2018 0808   HGBUR NEGATIVE 01/15/2020 1349   BILIRUBINUR NEGATIVE 01/15/2020 1349   KETONESUR NEGATIVE 01/15/2020 1349   PROTEINUR NEGATIVE 01/15/2020 1349   UROBILINOGEN 1.0 07/10/2018 0808   NITRITE NEGATIVE 01/15/2020 1349   LEUKOCYTESUR NEGATIVE 01/15/2020 1349   Sepsis Labs Invalid input(s): PROCALCITONIN,  WBC,  LACTICIDVEN Microbiology Recent Results (from the past 240 hour(s))  Blood Culture (routine x 2)     Status: None (Preliminary result)   Collection Time: 01/14/20  5:06 AM   Specimen: BLOOD RIGHT HAND  Result Value Ref Range Status   Specimen Description BLOOD RIGHT HAND  Final   Special Requests   Final    BOTTLES DRAWN AEROBIC AND ANAEROBIC Blood Culture results may not be optimal due to an inadequate volume of blood received in culture bottles   Culture   Final    NO GROWTH 3 DAYS Performed at [facility], [place] [address], [place], [place] [address]    Report Status PENDING  Incomplete  Blood Culture (routine x 2)     Status: None (Preliminary result)   Collection Time:  01/14/20  5:20 AM   Specimen: BLOOD  Result Value Ref Range Status   Specimen Description BLOOD RIGHT ANTECUBITAL  Final   Special Requests   Final    BOTTLES DRAWN AEROBIC AND ANAEROBIC Blood Culture results may not be optimal due to an inadequate volume of blood received in culture bottles   Culture   Final    NO GROWTH 3 DAYS Performed at [facility], [place] [address], [place], [place] [address]    Report Status  PENDING  Incomplete  Respiratory Panel by RT PCR (Flu A&B, Covid) - Nasopharyngeal Swab     Status: Abnormal   Collection Time: 01/14/20  9:13 AM   Specimen: Nasopharyngeal Swab  Result Value Ref Range Status   SARS Coronavirus 2 by RT PCR POSITIVE (A) NEGATIVE Final    Comment: RESULT CALLED TO, READ BACK BY AND VERIFIED WITH: Alesia Banda RN 10:20 01/14/20 ([email or web address]) (NOTE) SARS-CoV-2 target nucleic acids are DETECTED. SARS-CoV-2 RNA is generally detectable in upper respiratory specimens  during the acute phase of infection. Positive results are indicative of the presence of the identified virus, but do not rule out bacterial infection or co-infection with other pathogens not detected by the test. Clinical correlation with patient history and other diagnostic information is necessary to determine patient infection status. The expected result is Negative. Fact Sheet for Patients:  PinkCheek.be Fact Sheet for Healthcare Providers: GravelBags.it This test is not yet approved or cleared by the [place] FDA and  has been authorized for detection and/or diagnosis of SARS-CoV-2 by FDA under an Emergency Use Authorization (EUA).  This EUA will remain in effect (meaning this test can be used) f or the duration of  the COVID-19 declaration under Section 564(b)(1) of the Act, 21 U.S.C. section 360bbb-3(b)(1), unless the authorization is terminated or revoked sooner.    Influenza A by PCR NEGATIVE NEGATIVE Final   Influenza B by PCR NEGATIVE NEGATIVE Final    Comment: (NOTE) The Xpert Xpress SARS-CoV-2/FLU/RSV assay is intended as an aid in  the diagnosis of influenza from Nasopharyngeal swab specimens and  should not be used as a sole basis for treatment. Nasal washings and  aspirates are unacceptable for Xpert Xpress SARS-CoV-2/FLU/RSV  testing. Fact Sheet for Patients: PinkCheek.be Fact Sheet for  Healthcare Providers: GravelBags.it This test is not yet approved or cleared by the [place] FDA and  has been authorized for detection and/or diagnosis of SARS-CoV-2 by  FDA under an Emergency Use Authorization (EUA). This EUA will remain  in effect (meaning this test can be used) for the duration of the  Covid-19 declaration under Section 564(b)(1) of the Act, 21  U.S.C. section 360bbb-3(b)(1), unless the authorization is  terminated or revoked. Performed at [facility], [place] [address] [place], [place] [address]   Urine culture     Status: Abnormal   Collection Time: 01/14/20 12:28 PM   Specimen: Urine, Random  Result Value Ref Range Status   Specimen Description URINE, RANDOM  Final   Special Requests   Final    NONE Performed at [facility], [place] [address], [place],  [address]    Culture MULTIPLE SPECIES PRESENT, SUGGEST RECOLLECTION (A)  Final   Report Status 01/15/2020 FINAL  Final     Time coordinating discharge: Over 30 minutes  SIGNED:   Little Ishikawa, DO [facility] 01/18/2020, 7:39 AM Pager   If 7PM-7AM, please contact night-coverage [email or web address]

## 2020-01-19 LAB — CULTURE, BLOOD (ROUTINE X 2)
Culture: NO GROWTH
Culture: NO GROWTH

## 2020-01-23 ENCOUNTER — Ambulatory Visit: Payer: PPO

## 2020-01-26 NOTE — Telephone Encounter (Signed)
Scheduled for the [facility] (just type in or search COVID).   Pt contacted and scheduled for the Respiratory Clinic due to transportation.

## 2020-01-28 ENCOUNTER — Ambulatory Visit (INDEPENDENT_AMBULATORY_CARE_PROVIDER_SITE_OTHER): Payer: PPO

## 2020-01-28 ENCOUNTER — Ambulatory Visit (INDEPENDENT_AMBULATORY_CARE_PROVIDER_SITE_OTHER): Payer: PPO | Admitting: Family Medicine

## 2020-01-28 ENCOUNTER — Encounter: Payer: Self-pay | Admitting: Family Medicine

## 2020-01-28 VITALS — BP 148/70 | HR 65 | Temp 98.4°F | Ht 63.0 in | Wt 121.2 lb

## 2020-01-28 DIAGNOSIS — J1289 Other viral pneumonia: Secondary | ICD-10-CM | POA: Diagnosis not present

## 2020-01-28 DIAGNOSIS — I480 Paroxysmal atrial fibrillation: Secondary | ICD-10-CM | POA: Diagnosis not present

## 2020-01-28 DIAGNOSIS — R0781 Pleurodynia: Secondary | ICD-10-CM

## 2020-01-28 DIAGNOSIS — U071 COVID-19: Secondary | ICD-10-CM | POA: Diagnosis not present

## 2020-01-28 DIAGNOSIS — J1282 Pneumonia due to coronavirus disease 2019: Secondary | ICD-10-CM

## 2020-01-28 LAB — POCT URINALYSIS DIPSTICK
Bilirubin, UA: NEGATIVE
Blood, UA: NEGATIVE
Glucose, UA: NEGATIVE
Ketones, UA: NEGATIVE
Leukocytes, UA: NEGATIVE
Nitrite, UA: NEGATIVE
Odor: NORMAL
Protein, UA: NEGATIVE
Spec Grav, UA: 1.02 (ref 1.010–1.025)
Urobilinogen, UA: 0.2 E.U./dL
pH, UA: 6 (ref 5.0–8.0)

## 2020-01-28 MED ORDER — CEFDINIR 300 MG PO CAPS: 600.0000 mg | ORAL_CAPSULE | Freq: Every day | ORAL | 0 refills | Status: DC

## 2020-01-28 NOTE — Patient Instructions (Addendum)
Recommendation for management of lingering viral symptoms with :Vitamin D 5,000 IU daily, Vitamin C 500 mg twice daily Zinc 50 mg daily  Repeat chest x-ray requires any additional treatment with antibiotic therapy I will send to the pharmacy listed on follow-up.  I will notify you once your chest x-ray results are available.  I will notify you via MyChart once your labs results. Your urine results were normal today.   I have attached information about the COVID vaccine, please follow up and schedule a vaccine once opportunity is available.  As long as you are without fever, you may receive the vaccine.       Flank Pain, Adult Flank pain is pain in your side. The flank is the area of your side between your upper belly (abdomen) and your back. The pain may occur over a short time (acute), or it may be long-term or come back often (chronic). It may be mild or very bad. Pain in this area can be caused by many different things. Follow these instructions at home:   Drink enough fluid to keep your pee (urine) clear or pale yellow.  Rest as told by your doctor.  Take over-the-counter and prescription medicines only as told by your doctor.  Keep a journal to keep track of: ? What has caused your flank pain. ? What has made it feel better.  Keep all follow-up visits as told by your doctor. This is important. Contact a doctor if:  Medicine does not help your pain.  You have new symptoms.  Your pain gets worse.  You have a fever.  Your symptoms last longer than 2-3 days.  You have trouble peeing.  You are peeing more often than normal. Get help right away if:  You have trouble breathing.  You are short of breath.  Your belly hurts, or it is swollen or red.  You feel sick to your stomach (nauseous).  You throw up (vomit).  You feel like you will pass out, or you do pass out (faint).  You have blood in your pee. Summary  Flank pain is pain in your side. The flank  is the area of your side between your upper belly (abdomen) and your back.  Flank pain may occur over a short time (acute), or it may be long-term or come back often (chronic). It may be mild or very bad.  Pain in this area can be caused by many different things.  Contact your doctor if your symptoms get worse or they last longer than 2-3 days. This information is not intended to replace advice given to you by your health care provider. Make sure you discuss any questions you have with your health care provider. Document Revised: 11/04/2017 Document Reviewed: 03/14/2017 Elsevier Patient Education  [place].

## 2020-01-28 NOTE — Progress Notes (Addendum)
Patient ID: Vicki [name], female    DOB: 1946/05/05, 74 y.o.   MRN: [id number]  PCP: Cassandria Anger, MD  Chief Complaint  Patient presents with  . COVID-19 INFECTION    Subjective:  HPI  Vicki [name] is a 74 y.o. female presents to [facility] Respiratory clinic for evaluation of symptoms related to COVID-19.  Medical history significant for Atrial Fibrillation, Hypertension,   Patent diagnosed with COVID-19 on 01/11/20 and admitted to [facility]  for period 5 days for treatment of acute COVID-19 upper respiratory infection. Chest x-ray was significant for COVID-19 pneumonia.  She was treated with prednisone and has two remaining doses. She fell ill x 2 days ago and developed acute left sided rib pain. She reports feeling chills and fatigue on Saturday, but today only fatigue remains. Mild shortness of breath with exertion only. Denies fever, N & V, diarrhea. Endorses tolerating food and oral intake without issue.  She denies any chest pain or palpitations. She is currently completing her final does of prednisone that was prescribed at  discharge from [place] breathing has greatly improved. Imaging during hospital admission was consistent with COVID Pneumonia involving both lungs (01/14/2020). She has no history of lung disease.  Review of Systems Pertinent negatives listed in HPI  Patient Active Problem List   Diagnosis Date Noted  . Acute respiratory disease due to COVID-19 virus 01/14/2020  . Colon cancer screening 07/11/2019  . Cancer ([place]) 02/05/2019  . Menopausal syndrome 02/05/2019  . Neoplasm of breast 02/05/2019  . Chest wall pain 10/30/2018  . Insomnia 01/09/2018  . Ankle sprain 07/08/2017  . Weight loss 01/06/2017  . Neoplasm of uncertain behavior of skin 04/16/2016  . Solitary pulmonary nodule 10/06/2015  . Visit for monitoring Tikosyn therapy 09/06/2015  . Epistaxis 08/08/2015  . Cerumen impaction 07/04/2015  . Abdominal  bloating 01/03/2015  . Cough 03/20/2014  . Well adult exam 03/19/2014  . Elevated BP 03/19/2014  . Acute URI 02/28/2014  . Shingles rash 01/22/2013  . Routine health maintenance 01/03/2012  . PAROXYSMAL ATRIAL FIBRILLATION 02/10/2011  . RECTAL BLEEDING 08/07/2009  . COLONIC POLYPS, HYPERPLASTIC, HX OF 08/04/2009  . ADENOCARCINOMA, BREAST 09/07/2008  . AORTIC INSUFFICIENCY, MILD 09/07/2008  . DIVERTICULOSIS OF COLON 09/07/2008  . DEGENERATIVE JOINT DISEASE 09/07/2008  . Dyslipidemia 07/04/2008  . Anxiety state 07/04/2008  . ALLERGIC RHINITIS 07/04/2008  . GERD 07/04/2008  . CONTACT DERMATITIS 07/04/2008  . Osteopenia 07/04/2008      Prior to Admission medications   Medication Sig Start Date End Date Taking? Authorizing Provider  acetaminophen (TYLENOL) 325 MG tablet Take 500 mg by mouth every 6 (six) hours as needed for mild pain or fever.    Yes [provider]  cholecalciferol (VITAMIN D) 1000 UNITS tablet Take 1,000 Units by mouth 2 (two) times daily.   Yes [provider]  famotidine (PEPCID) 10 MG tablet Take 10 mg by mouth 2 (two) times daily.   Yes [provider]  loratadine (CLARITIN) 10 MG tablet Take 10 mg by mouth daily.   Yes [provider]  Melatonin 3 MG TABS Take 3 mg by mouth at bedtime as needed (for sleep).    Yes [provider]  Multiple Vitamins-Minerals (ICAPS) TABS Take 1 tablet by mouth 2 (two) times daily.    Yes [provider]  ondansetron (ZOFRAN) 4 MG tablet Take 1 tablet (4 mg total) by mouth every 6 (six) hours. 01/11/20  Yes Couture, Cortni  S, PA-C  predniSONE (DELTASONE) 10 MG tablet Take 4 tablets (40 mg total) by mouth daily for 3 days, THEN 3 tablets (30 mg total) daily for 3 days, THEN 2 tablets (20 mg total) daily for 3 days, THEN 1 tablet (10 mg total) daily for 3 days. 01/18/20 01/30/20 Yes Little Ishikawa, MD  simvastatin (ZOCOR) 20 MG tablet TAKE 1 TABLET BY MOUTH AT BEDTIME Patient  taking differently: Take 20 mg by mouth at bedtime.  11/15/19  Yes Plotnikov, Evie Lacks, MD  XARELTO 20 MG TABS tablet Take 1 tablet by mouth once daily Patient taking differently: Take 20 mg by mouth daily with supper.  08/23/19  Yes Lorretta Harp, MD    Past Medical, Surgical Family and Social History reviewed and updated.    Objective:   Today's Vitals   01/28/20 1729  BP: (!) 148/70  Pulse: 65  Temp: 98.4 F (36.9 C)  TempSrc: Oral  SpO2: 97%  Weight: 121 lb 4 oz (55 kg)  Height: 5\' 3"  (1.6 m)    Wt Readings from Last 3 Encounters:  01/28/20 121 lb 4 oz (55 kg)  01/14/20 117 lb 15.1 oz (53.5 kg)  01/11/20 115 lb (52.2 kg)     Physical Exam General appearance: alert, well developed, well nourished, cooperative and in no distress Head: Normocephalic, without obvious abnormality, atraumatic Respiratory: Respirations even and unlabored, normal respiratory rate, lung clear/diminished Heart: rate and rhythm normal. No gallop or murmurs noted on exam  Abdomen: BS +, no distention, no rebound tenderness, or no mass Extremities: No gross deformities Skin: Skin color, texture, turgor normal. No rashes seen  Psych: Appropriate mood and affect. Neurologic: Mental status: Alert, oriented to person, place, and time, thought content appropriate.    Assessment & Plan:  1. Paroxysmal atrial fibrillation (HCC) -Anticoagulated with Xarelto.  Currently in sinus rhythm.  Continue follow-up as needed with cardiology.  2. Pneumonia due to COVID-19 virus Chest x-ray done recent admission to [facility] was consistent with bilateral pneumonia.  Patient presents today with some left-sided rib pain that has worsened over the course of the last couple days.  Will obtained a chest x-ray today to rule out any worsening of pneumonia.  If pneumonia is present we will treat empirically for community-acquired pneumonia.   -Checking CBC for leukocytosis -CMP to evaluate renal  function and electrolyte status. - CBC with Differential/Platelet; Future  3. Rib pain on left side - POCT Urinalysis Dipstick, negative  - DG Chest Portable 2 Views, pending   DG Chest Portable 2 Views  Result Date: 01/28/2020 CLINICAL DATA:  Left chest pain for 10 days, worsening. COVID pneumonia. EXAM: CHEST  2 VIEW PORTABLE COMPARISON:  Radiograph 01/14/2020 FINDINGS: Bilateral lung opacities on prior exam have near completely resolved with minimal residual streaky opacity in the periphery of the right mid lung and right lung base. Blunting of the costophrenic angles may be due to hyperinflation or small pleural effusions. Heart is normal in size with normal mediastinal contours. No pulmonary edema or pneumothorax. Biapical pleuroparenchymal scarring. No acute osseous abnormalities are seen. IMPRESSION: Near complete resolution of bilateral lung opacities since radiograph earlier this month with minimal residual streaky opacity in the periphery of the right mid lung and right lung base. Blunting of costophrenic angles may be due to hyperinflation or small effusions Electronically Signed   By: Keith Rake M.D.   On: 01/28/2020 18:39    -Chest x-ray with resulted after patient discharged from  respiratory clinic.  Imaging is significant for some right mid and right lung base pneumonia.  Will cover empirically with cefdinir 600 mg once daily x10 days.  Patient advised to return for follow-up in 1 week for repeat chest x-ray.   -The patient was given clear instructions to go to ER or return to medical center if symptoms do not improve, worsen or new problems develop. The patient verbalized understanding.     Molli Barrows, FNP-C [facility], PRN Provider  [facility]. [place], [place] [facility] Phone: [phone] Clinic Fax: [phone] Clinic Hours: 5:30 pm -7:30 pm (Monday-Friday)

## 2020-01-29 LAB — CBC WITH DIFFERENTIAL/PLATELET
Basophils Absolute: 0 10*3/uL (ref 0.0–0.2)
Basos: 0 %
EOS (ABSOLUTE): 0 10*3/uL (ref 0.0–0.4)
Eos: 0 %
Hematocrit: 34.1 % (ref 34.0–46.6)
Hemoglobin: 12 g/dL (ref 11.1–15.9)
Immature Grans (Abs): 0.1 10*3/uL (ref 0.0–0.1)
Immature Granulocytes: 1 %
Lymphocytes Absolute: 2.1 10*3/uL (ref 0.7–3.1)
Lymphs: 19 %
MCH: 32.1 pg (ref 26.6–33.0)
MCHC: 35.2 g/dL (ref 31.5–35.7)
MCV: 91 fL (ref 79–97)
Monocytes Absolute: 1 10*3/uL — ABNORMAL HIGH (ref 0.1–0.9)
Monocytes: 9 %
Neutrophils Absolute: 7.9 10*3/uL — ABNORMAL HIGH (ref 1.4–7.0)
Neutrophils: 71 %
Platelets: 342 10*3/uL (ref 150–450)
RBC: 3.74 x10E6/uL — ABNORMAL LOW (ref 3.77–5.28)
RDW: 13 % (ref 11.7–15.4)
WBC: 11.2 10*3/uL — ABNORMAL HIGH (ref 3.4–10.8)

## 2020-01-29 LAB — COMPREHENSIVE METABOLIC PANEL
ALT: 21 IU/L (ref 0–32)
AST: 22 IU/L (ref 0–40)
Albumin/Globulin Ratio: 1.4 (ref 1.2–2.2)
Albumin: 3.7 g/dL (ref 3.7–4.7)
Alkaline Phosphatase: 114 IU/L (ref 39–117)
BUN/Creatinine Ratio: 25 (ref 12–28)
BUN: 15 mg/dL (ref 8–27)
Bilirubin Total: 0.4 mg/dL (ref 0.0–1.2)
CO2: 26 mmol/L (ref 20–29)
Calcium: 8.7 mg/dL (ref 8.7–10.3)
Chloride: 99 mmol/L (ref 96–106)
Creatinine, Ser: 0.61 mg/dL (ref 0.57–1.00)
GFR calc Af Amer: 104 mL/min/{1.73_m2} (ref >59)
GFR calc non Af Amer: 90 mL/min/{1.73_m2} (ref >59)
Globulin, Total: 2.6 g/dL (ref 1.5–4.5)
Glucose: 94 mg/dL (ref 65–99)
Potassium: 4.2 mmol/L (ref 3.5–5.2)
Sodium: 139 mmol/L (ref 134–144)
Total Protein: 6.3 g/dL (ref 6.0–8.5)

## 2020-02-05 ENCOUNTER — Other Ambulatory Visit: Payer: Self-pay

## 2020-02-05 ENCOUNTER — Ambulatory Visit (INDEPENDENT_AMBULATORY_CARE_PROVIDER_SITE_OTHER): Payer: PPO | Admitting: Internal Medicine

## 2020-02-05 ENCOUNTER — Encounter: Payer: Self-pay | Admitting: Internal Medicine

## 2020-02-05 DIAGNOSIS — J069 Acute upper respiratory infection, unspecified: Secondary | ICD-10-CM

## 2020-02-05 DIAGNOSIS — U071 COVID-19: Secondary | ICD-10-CM | POA: Diagnosis not present

## 2020-02-05 DIAGNOSIS — I480 Paroxysmal atrial fibrillation: Secondary | ICD-10-CM | POA: Diagnosis not present

## 2020-02-05 MED ORDER — RIVAROXABAN 20 MG PO TABS: 3.0000 mg | ORAL_TABLET | Freq: Every day | ORAL | 3 refills | Status: DC

## 2020-02-05 MED ORDER — SIMVASTATIN 20 MG PO TABS: 20.0000 mg | ORAL_TABLET | Freq: Every day | ORAL | 1 refills | Status: DC

## 2020-02-05 NOTE — Progress Notes (Signed)
Subjective:  Patient ID: Vicki [name], female    DOB: 07/04/1946  Age: 74 y.o. MRN: [id number]  CC: No chief complaint on file.   HPI Vicki [name] presents for post-hosp COVID  Per hx:  "Admit date: 01/14/2020 Discharge date: 01/18/2020  Admitted From: Home Disposition: Home  Recommendations for Outpatient Follow-up:  1. Follow up with PCP in 1-2 weeks 2. Please obtain BMP/CBC in one week  Discharge Condition: Stable CODE STATUS: Full Diet recommendation: As tolerated  Brief/Interim Summary: 74 year old with history of breast cancer status post radiation, atrial fibrillation, hyperlipidemia presented with shortness of breath secondary to COVID-19. Outpatient she had received hydroxychloroquine without clinical improvement. Even was seen in the ER on 2/5 with diarrhea when she received IV fluids and inhaler but then discharged home. Despite of going home she progressively worsened therefore came to the hospital.  Patient met as above with acute hypoxic respiratory failure in the setting of COVID-19 pneumonia.  Patient treated with remdesivir and dexamethasone with marked improvement over the past 48 hours.  Patient now ambulating without hypoxia or symptomatology.  Patient otherwise stable and agreeable for discharge home, close follow-up with PCP in the next 1 to 2 weeks as discussed. Will need to quarantine for 21 days after covid swab as discussed.  Discharge Diagnoses:  Principal Problem:   Acute respiratory disease due to COVID-19 virus Active Problems:   Dyslipidemia   PAROXYSMAL ATRIAL FIBRILLATION"   Outpatient Medications Prior to Visit  Medication Sig Dispense Refill  . acetaminophen (TYLENOL) 325 MG tablet Take 500 mg by mouth every 6 (six) hours as needed for mild pain or fever.     . cefdinir (OMNICEF) 300 MG capsule Take 2 capsules (600 mg total) by mouth daily. 20 capsule 0  . cholecalciferol (VITAMIN D) 1000 UNITS tablet Take 1,000 Units by  mouth 2 (two) times daily.    . famotidine (PEPCID) 10 MG tablet Take 10 mg by mouth 2 (two) times daily.    Marland Kitchen loratadine (CLARITIN) 10 MG tablet Take 10 mg by mouth daily.    . Melatonin 3 MG TABS Take 3 mg by mouth at bedtime as needed (for sleep).     . Multiple Vitamins-Minerals (ICAPS) TABS Take 1 tablet by mouth 2 (two) times daily.     . simvastatin (ZOCOR) 20 MG tablet TAKE 1 TABLET BY MOUTH AT BEDTIME (Patient taking differently: Take 20 mg by mouth at bedtime. ) 90 tablet 1  . XARELTO 20 MG TABS tablet Take 1 tablet by mouth once daily (Patient taking differently: Take 20 mg by mouth daily with supper. ) 90 tablet 1  . ondansetron (ZOFRAN) 4 MG tablet Take 1 tablet (4 mg total) by mouth every 6 (six) hours. (Patient not taking: Reported on 02/05/2020) 12 tablet 0   No facility-administered medications prior to visit.    ROS: Review of Systems  Constitutional: Positive for fatigue. Negative for activity change, appetite change, chills and unexpected weight change.  HENT: Negative for congestion, mouth sores and sinus pressure.   Eyes: Negative for visual disturbance.  Respiratory: Positive for chest tightness. Negative for cough.   Gastrointestinal: Negative for abdominal pain and nausea.  Genitourinary: Negative for difficulty urinating, frequency and vaginal pain.  Musculoskeletal: Negative for back pain and gait problem.  Skin: Negative for pallor and rash.  Neurological: Negative for dizziness, tremors, weakness, numbness and headaches.  Psychiatric/Behavioral: Negative for confusion, sleep disturbance and suicidal ideas.    Objective:  BP 128/84 (BP  Location: Right Arm, Patient Position: Sitting, Cuff Size: Normal)   Pulse 65   Temp 98 F (36.7 C) (Oral)   Ht 5' 3"  (1.6 m)   Wt 120 lb (54.4 kg)   SpO2 95%   BMI 21.26 kg/m   BP Readings from Last 3 Encounters:  02/05/20 128/84  01/28/20 (!) 148/70  01/18/20 (!) 150/81    Wt Readings from Last 3 Encounters:    02/05/20 120 lb (54.4 kg)  01/28/20 121 lb 4 oz (55 kg)  01/14/20 117 lb 15.1 oz (53.5 kg)    Physical Exam Constitutional:      General: She is not in acute distress.    Appearance: She is well-developed.  HENT:     Head: Normocephalic.     Right Ear: External ear normal.     Left Ear: External ear normal.     Nose: Nose normal.  Eyes:     General:        Right eye: No discharge.        Left eye: No discharge.     Conjunctiva/sclera: Conjunctivae normal.     Pupils: Pupils are equal, round, and reactive to light.  Neck:     Thyroid: No thyromegaly.     Vascular: No JVD.     Trachea: No tracheal deviation.  Cardiovascular:     Rate and Rhythm: Normal rate and regular rhythm.     Heart sounds: Normal heart sounds.  Pulmonary:     Effort: No respiratory distress.     Breath sounds: No stridor. No wheezing.  Abdominal:     General: Bowel sounds are normal. There is no distension.     Palpations: Abdomen is soft. There is no mass.     Tenderness: There is no abdominal tenderness. There is no guarding or rebound.  Musculoskeletal:        General: No tenderness.     Cervical back: Normal range of motion and neck supple.  Lymphadenopathy:     Cervical: No cervical adenopathy.  Skin:    Findings: No erythema or rash.  Neurological:     Cranial Nerves: No cranial nerve deficit.     Motor: No abnormal muscle tone.     Coordination: Coordination normal.     Deep Tendon Reflexes: Reflexes normal.  Psychiatric:        Behavior: Behavior normal.        Thought Content: Thought content normal.        Judgment: Judgment normal.    >45 min  Records reviewed Lab Results  Component Value Date   WBC 11.2 (H) 01/28/2020   HGB 12.0 01/28/2020   HCT 34.1 01/28/2020   PLT 342 01/28/2020   GLUCOSE 94 01/28/2020   CHOL 165 07/11/2019   TRIG 71 01/14/2020   HDL 70.40 07/11/2019   LDLCALC 77 07/11/2019   ALT 21 01/28/2020   AST 22 01/28/2020   NA 139 01/28/2020   K 4.2  01/28/2020   CL 99 01/28/2020   CREATININE 0.61 01/28/2020   BUN 15 01/28/2020   CO2 26 01/28/2020   TSH 2.53 01/10/2019   INR 1.41 08/07/2015    DG Chest Port 1 View  Result Date: 01/14/2020 CLINICAL DATA:  74 year old female with COVID-19 diagnosed late last month. Shortness of breath. EXAM: PORTABLE CHEST 1 VIEW COMPARISON:  Portable chest 01/11/2020 and earlier. FINDINGS: Portable AP semi upright view at 0600 hours. Increasing widespread and confluent bilateral mid and lower lung opacity. Upper lungs seem  to remain spared. Lower lung volumes. No pleural effusion. Stable cardiac size and mediastinal contours. Visualized tracheal air column is within normal limits. No acute osseous abnormality identified. IMPRESSION: Progressive bilateral COVID-19 pneumonia since 01/11/2020. Electronically Signed   By: Genevie Ann M.D.   On: 01/14/2020 06:11    Assessment & Plan:   There are no diagnoses linked to this encounter.   No orders of the defined types were placed in this encounter.    Follow-up: No follow-ups on file.  Walker Kehr, MD

## 2020-02-05 NOTE — Assessment & Plan Note (Signed)
Xarelto po

## 2020-02-05 NOTE — Assessment & Plan Note (Addendum)
Recovering. Discussed Repeat CXR or chest CT in 4 wks

## 2020-02-06 ENCOUNTER — Encounter (INDEPENDENT_AMBULATORY_CARE_PROVIDER_SITE_OTHER): Payer: PPO | Admitting: Ophthalmology

## 2020-02-06 DIAGNOSIS — I10 Essential (primary) hypertension: Secondary | ICD-10-CM

## 2020-02-06 DIAGNOSIS — H2513 Age-related nuclear cataract, bilateral: Secondary | ICD-10-CM | POA: Diagnosis not present

## 2020-02-06 DIAGNOSIS — H43813 Vitreous degeneration, bilateral: Secondary | ICD-10-CM | POA: Diagnosis not present

## 2020-02-06 DIAGNOSIS — H35033 Hypertensive retinopathy, bilateral: Secondary | ICD-10-CM | POA: Diagnosis not present

## 2020-02-06 DIAGNOSIS — H353221 Exudative age-related macular degeneration, left eye, with active choroidal neovascularization: Secondary | ICD-10-CM

## 2020-02-06 DIAGNOSIS — H353112 Nonexudative age-related macular degeneration, right eye, intermediate dry stage: Secondary | ICD-10-CM | POA: Diagnosis not present

## 2020-02-07 ENCOUNTER — Telehealth: Payer: Self-pay | Admitting: Internal Medicine

## 2020-02-07 DIAGNOSIS — R197 Diarrhea, unspecified: Secondary | ICD-10-CM

## 2020-02-07 NOTE — Telephone Encounter (Signed)
New message:   Pt is calling and states she was just here on 02/05/20 and got a great report from doctor but between yesterday and today she is experiencing fever, diarrhea and flu-like symptoms. She states she just got over Covid before she came in here and didn't think she would catch it again. Please advise.

## 2020-02-08 NOTE — Telephone Encounter (Signed)
Pt.notified

## 2020-02-08 NOTE — Telephone Encounter (Signed)
° °  Patient calling to report no fever today. Wants to know if antibiotic could be the cause of diarrhea

## 2020-02-08 NOTE — Telephone Encounter (Signed)
Pt notified that the antibiotic could be the cause of the diarrhea. Spoke to Dr Sharlet Salina and she did not recommend the patient get retested since it could lead to a false positive. Pt notified of this and to make sure she is getting plenty of fluids to reduce the rick of dehydration. Also told her that we could have her seen at the respiratory clinic or post-covid clinic but pt denied these services since her fever is gone but will let us know if she changes her mind.

## 2020-02-08 NOTE — Telephone Encounter (Signed)
Stop the antibiotic if still taking it. Take OTC Imodium A-D as directed for diarrhea. Get probiotic Florastor and take 1 twice a day for 2 weeks. If no better, bring stool sample for C. difficile toxin on Monday Thanks

## 2020-02-14 ENCOUNTER — Ambulatory Visit
Admission: RE | Admit: 2020-02-14 | Discharge: 2020-02-14 | Disposition: A | Discharge status: Home / Self Care | Payer: PPO | Source: Ambulatory Visit | Attending: Internal Medicine | Admitting: Internal Medicine

## 2020-02-14 ENCOUNTER — Other Ambulatory Visit: Payer: Self-pay

## 2020-02-14 DIAGNOSIS — Z1231 Encounter for screening mammogram for malignant neoplasm of breast: Secondary | ICD-10-CM

## 2020-02-19 ENCOUNTER — Other Ambulatory Visit: Payer: Self-pay

## 2020-03-18 ENCOUNTER — Ambulatory Visit (INDEPENDENT_AMBULATORY_CARE_PROVIDER_SITE_OTHER): Payer: PPO | Admitting: Internal Medicine

## 2020-03-18 ENCOUNTER — Other Ambulatory Visit: Payer: Self-pay

## 2020-03-18 ENCOUNTER — Encounter: Payer: Self-pay | Admitting: Internal Medicine

## 2020-03-18 VITALS — BP 136/82 | HR 68 | Temp 98.1°F | Ht 63.0 in | Wt 122.0 lb

## 2020-03-18 DIAGNOSIS — J069 Acute upper respiratory infection, unspecified: Secondary | ICD-10-CM | POA: Diagnosis not present

## 2020-03-18 DIAGNOSIS — E785 Hyperlipidemia, unspecified: Secondary | ICD-10-CM | POA: Diagnosis not present

## 2020-03-18 DIAGNOSIS — R634 Abnormal weight loss: Secondary | ICD-10-CM | POA: Diagnosis not present

## 2020-03-18 DIAGNOSIS — K219 Gastro-esophageal reflux disease without esophagitis: Secondary | ICD-10-CM

## 2020-03-18 DIAGNOSIS — D689 Coagulation defect, unspecified: Secondary | ICD-10-CM | POA: Diagnosis not present

## 2020-03-18 DIAGNOSIS — I480 Paroxysmal atrial fibrillation: Secondary | ICD-10-CM | POA: Diagnosis not present

## 2020-03-18 DIAGNOSIS — U071 COVID-19: Secondary | ICD-10-CM | POA: Diagnosis not present

## 2020-03-18 NOTE — Assessment & Plan Note (Signed)
Resolved OK to get vaccinated

## 2020-03-18 NOTE — Assessment & Plan Note (Signed)
Wt Readings from Last 3 Encounters:  03/18/20 122 lb (55.3 kg)  02/05/20 120 lb (54.4 kg)  01/28/20 121 lb 4 oz (55 kg)

## 2020-03-18 NOTE — Assessment & Plan Note (Signed)
Simvastatin 

## 2020-03-18 NOTE — Assessment & Plan Note (Signed)
On Xarelto and off Tycosyn. S/p ablation 6/17

## 2020-03-18 NOTE — Assessment & Plan Note (Signed)
Pepcid?

## 2020-03-18 NOTE — Progress Notes (Signed)
Subjective:  Patient ID: Vicki [name], female    DOB: October 03, 1946  Age: 74 y.o. MRN: [id number]  CC: No chief complaint on file.   HPI [name] presents for a 6 wks f/u - recent COVID 87 in Jan-Feb, PAF, GERD  Doing well  Outpatient Medications Prior to Visit  Medication Sig Dispense Refill  . acetaminophen (TYLENOL) 325 MG tablet Take 500 mg by mouth every 6 (six) hours as needed for mild pain or fever.     . cholecalciferol (VITAMIN D) 1000 UNITS tablet Take 1,000 Units by mouth 2 (two) times daily.    . famotidine (PEPCID) 10 MG tablet Take 10 mg by mouth 2 (two) times daily.    Marland Kitchen loratadine (CLARITIN) 10 MG tablet Take 10 mg by mouth daily.    . Melatonin 3 MG TABS Take 3 mg by mouth at bedtime as needed (for sleep).     . Multiple Vitamins-Minerals (ICAPS) TABS Take 1 tablet by mouth 2 (two) times daily.     . rivaroxaban (XARELTO) 20 MG TABS tablet Take 1 tablet (20 mg total) by mouth daily. 90 tablet 3  . simvastatin (ZOCOR) 20 MG tablet Take 1 tablet (20 mg total) by mouth at bedtime. 90 tablet 1  . cefdinir (OMNICEF) 300 MG capsule Take 2 capsules (600 mg total) by mouth daily. (Patient not taking: Reported on 03/18/2020) 20 capsule 0   No facility-administered medications prior to visit.    ROS: Review of Systems  Constitutional: Negative for activity change, appetite change, chills, fatigue and unexpected weight change.  HENT: Negative for congestion, mouth sores and sinus pressure.   Eyes: Negative for visual disturbance.  Respiratory: Negative for cough and chest tightness.   Gastrointestinal: Negative for abdominal pain and nausea.  Genitourinary: Negative for difficulty urinating, frequency and vaginal pain.  Musculoskeletal: Negative for back pain and gait problem.  Skin: Negative for pallor and rash.  Neurological: Negative for dizziness, tremors, weakness, numbness and headaches.  Psychiatric/Behavioral: Negative for confusion and sleep disturbance.     Objective:  BP 136/82 (BP Location: Left Arm, Patient Position: Sitting, Cuff Size: Normal)   Pulse 68   Temp 98.1 F (36.7 C) (Oral)   Ht 5\' 3"  (1.6 m)   Wt 122 lb (55.3 kg)   SpO2 98%   BMI 21.61 kg/m   BP Readings from Last 3 Encounters:  03/18/20 136/82  02/05/20 128/84  01/28/20 (!) 148/70    Wt Readings from Last 3 Encounters:  03/18/20 122 lb (55.3 kg)  02/05/20 120 lb (54.4 kg)  01/28/20 121 lb 4 oz (55 kg)    Physical Exam Constitutional:      General: She is not in acute distress.    Appearance: She is well-developed.  HENT:     Head: Normocephalic.     Right Ear: External ear normal.     Left Ear: External ear normal.     Nose: Nose normal.  Eyes:     General:        Right eye: No discharge.        Left eye: No discharge.     Conjunctiva/sclera: Conjunctivae normal.     Pupils: Pupils are equal, round, and reactive to light.  Neck:     Thyroid: No thyromegaly.     Vascular: No JVD.     Trachea: No tracheal deviation.  Cardiovascular:     Rate and Rhythm: Normal rate and regular rhythm.     Heart sounds:  Normal heart sounds.  Pulmonary:     Effort: No respiratory distress.     Breath sounds: No stridor. No wheezing.  Abdominal:     General: Bowel sounds are normal. There is no distension.     Palpations: Abdomen is soft. There is no mass.     Tenderness: There is no abdominal tenderness. There is no guarding or rebound.  Musculoskeletal:        General: No tenderness.     Cervical back: Normal range of motion and neck supple.  Lymphadenopathy:     Cervical: No cervical adenopathy.  Skin:    Findings: No erythema or rash.  Neurological:     Cranial Nerves: No cranial nerve deficit.     Motor: No abnormal muscle tone.     Coordination: Coordination normal.     Deep Tendon Reflexes: Reflexes normal.  Psychiatric:        Behavior: Behavior normal.        Thought Content: Thought content normal.        Judgment: Judgment normal.      Lab Results  Component Value Date   WBC 11.2 (H) 01/28/2020   HGB 12.0 01/28/2020   HCT 34.1 01/28/2020   PLT 342 01/28/2020   GLUCOSE 94 01/28/2020   CHOL 165 07/11/2019   TRIG 71 01/14/2020   HDL 70.40 07/11/2019   LDLCALC 77 07/11/2019   ALT 21 01/28/2020   AST 22 01/28/2020   NA 139 01/28/2020   K 4.2 01/28/2020   CL 99 01/28/2020   CREATININE 0.61 01/28/2020   BUN 15 01/28/2020   CO2 26 01/28/2020   TSH 2.53 01/10/2019   INR 1.41 08/07/2015    MM 3D SCREEN BREAST BILATERAL  Result Date: 02/14/2020 CLINICAL DATA:  Screening. EXAM: DIGITAL SCREENING BILATERAL MAMMOGRAM WITH TOMO AND CAD COMPARISON:  Previous exam(s). ACR Breast Density Category b: There are scattered areas of fibroglandular density. FINDINGS: There are no findings suspicious for malignancy. Images were processed with CAD. IMPRESSION: No mammographic evidence of malignancy. A result letter of this screening mammogram will be mailed directly to the patient. RECOMMENDATION: Screening mammogram in one year. (Code:SM-B-01Y) BI-RADS CATEGORY  1: Negative. Electronically Signed   By: Curlene Dolphin M.D.   On: 02/14/2020 16:34    Assessment & Plan:    Follow-up: No follow-ups on file.  Walker Kehr, MD

## 2020-04-23 ENCOUNTER — Encounter (INDEPENDENT_AMBULATORY_CARE_PROVIDER_SITE_OTHER): Payer: PPO | Admitting: Ophthalmology

## 2020-04-23 ENCOUNTER — Other Ambulatory Visit: Payer: Self-pay

## 2020-04-23 DIAGNOSIS — I10 Essential (primary) hypertension: Secondary | ICD-10-CM

## 2020-04-23 DIAGNOSIS — H353221 Exudative age-related macular degeneration, left eye, with active choroidal neovascularization: Secondary | ICD-10-CM

## 2020-04-23 DIAGNOSIS — H35033 Hypertensive retinopathy, bilateral: Secondary | ICD-10-CM

## 2020-04-23 DIAGNOSIS — H43813 Vitreous degeneration, bilateral: Secondary | ICD-10-CM

## 2020-04-23 DIAGNOSIS — H353112 Nonexudative age-related macular degeneration, right eye, intermediate dry stage: Secondary | ICD-10-CM | POA: Diagnosis not present

## 2020-04-30 ENCOUNTER — Other Ambulatory Visit: Payer: Self-pay

## 2020-04-30 ENCOUNTER — Encounter: Payer: Self-pay | Admitting: Internal Medicine

## 2020-04-30 ENCOUNTER — Ambulatory Visit (INDEPENDENT_AMBULATORY_CARE_PROVIDER_SITE_OTHER): Payer: PPO | Admitting: Internal Medicine

## 2020-04-30 VITALS — BP 170/90 | HR 58 | Temp 97.9°F | Ht 63.0 in | Wt 120.0 lb

## 2020-04-30 DIAGNOSIS — R030 Elevated blood-pressure reading, without diagnosis of hypertension: Secondary | ICD-10-CM | POA: Diagnosis not present

## 2020-04-30 DIAGNOSIS — I480 Paroxysmal atrial fibrillation: Secondary | ICD-10-CM

## 2020-04-30 DIAGNOSIS — E785 Hyperlipidemia, unspecified: Secondary | ICD-10-CM | POA: Diagnosis not present

## 2020-04-30 DIAGNOSIS — E559 Vitamin D deficiency, unspecified: Secondary | ICD-10-CM | POA: Diagnosis not present

## 2020-04-30 LAB — URINALYSIS
Bilirubin Urine: NEGATIVE
Hgb urine dipstick: NEGATIVE
Ketones, ur: NEGATIVE
Leukocytes,Ua: NEGATIVE
Nitrite: NEGATIVE
Specific Gravity, Urine: 1.015 (ref 1.000–1.030)
Total Protein, Urine: NEGATIVE
Urine Glucose: NEGATIVE
Urobilinogen, UA: 0.2 (ref 0.0–1.0)
pH: 8 (ref 5.0–8.0)

## 2020-04-30 LAB — HEPATIC FUNCTION PANEL
ALT: 12 U/L (ref 0–35)
AST: 18 U/L (ref 0–37)
Albumin: 4.1 g/dL (ref 3.5–5.2)
Alkaline Phosphatase: 102 U/L (ref 39–117)
Bilirubin, Direct: 0.1 mg/dL (ref 0.0–0.3)
Total Bilirubin: 0.5 mg/dL (ref 0.2–1.2)
Total Protein: 6.6 g/dL (ref 6.0–8.3)

## 2020-04-30 LAB — CBC WITH DIFFERENTIAL/PLATELET
Basophils Absolute: 0 10*3/uL (ref 0.0–0.1)
Basophils Relative: 0.6 % (ref 0.0–3.0)
Eosinophils Absolute: 0 10*3/uL (ref 0.0–0.7)
Eosinophils Relative: 0 % (ref 0.0–5.0)
HCT: 37.4 % (ref 36.0–46.0)
Hemoglobin: 12.6 g/dL (ref 12.0–15.0)
Lymphocytes Relative: 27.7 % (ref 12.0–46.0)
Lymphs Abs: 1.7 10*3/uL (ref 0.7–4.0)
MCHC: 33.8 g/dL (ref 30.0–36.0)
MCV: 93.1 fl (ref 78.0–100.0)
Monocytes Absolute: 0.9 10*3/uL (ref 0.1–1.0)
Monocytes Relative: 14.9 % — ABNORMAL HIGH (ref 3.0–12.0)
Neutro Abs: 3.5 10*3/uL (ref 1.4–7.7)
Neutrophils Relative %: 56.8 % (ref 43.0–77.0)
Platelets: 283 10*3/uL (ref 150.0–400.0)
RBC: 4.01 Mil/uL (ref 3.87–5.11)
RDW: 13 % (ref 11.5–15.5)
WBC: 6.1 10*3/uL (ref 4.0–10.5)

## 2020-04-30 LAB — BASIC METABOLIC PANEL
BUN: 9 mg/dL (ref 6–23)
CO2: 31 mEq/L (ref 19–32)
Calcium: 9.4 mg/dL (ref 8.4–10.5)
Chloride: 100 mEq/L (ref 96–112)
Creatinine, Ser: 0.66 mg/dL (ref 0.40–1.20)
GFR: 87.63 mL/min (ref >60.00)
Glucose, Bld: 102 mg/dL — ABNORMAL HIGH (ref 70–99)
Potassium: 4.8 mEq/L (ref 3.5–5.1)
Sodium: 136 mEq/L (ref 135–145)

## 2020-04-30 LAB — VITAMIN D 25 HYDROXY (VIT D DEFICIENCY, FRACTURES): VITD: 70.61 ng/mL (ref 30.00–100.00)

## 2020-04-30 LAB — TSH: TSH: 2.96 u[IU]/mL (ref 0.35–4.50)

## 2020-04-30 NOTE — Assessment & Plan Note (Signed)
Simvastatin Labs 

## 2020-04-30 NOTE — Progress Notes (Signed)
Subjective:  Patient ID: Vicki [name], female    DOB: 1946-09-05  Age: 74 y.o. MRN: [id number]  CC: No chief complaint on file.   HPI Vicki [name] presents for GERD, A fib, dyslipidemia f/u  Outpatient Medications Prior to Visit  Medication Sig Dispense Refill  . acetaminophen (TYLENOL) 325 MG tablet Take 500 mg by mouth every 6 (six) hours as needed for mild pain or fever.     . cholecalciferol (VITAMIN D) 1000 UNITS tablet Take 1,000 Units by mouth 2 (two) times daily.    . famotidine (PEPCID) 10 MG tablet Take 10 mg by mouth 2 (two) times daily.    Marland Kitchen loratadine (CLARITIN) 10 MG tablet Take 10 mg by mouth daily.    . Melatonin 3 MG TABS Take 3 mg by mouth at bedtime as needed (for sleep).     . Multiple Vitamins-Minerals (ICAPS) TABS Take 1 tablet by mouth 2 (two) times daily.     . rivaroxaban (XARELTO) 20 MG TABS tablet Take 1 tablet (20 mg total) by mouth daily. 90 tablet 3  . simvastatin (ZOCOR) 20 MG tablet Take 1 tablet (20 mg total) by mouth at bedtime. 90 tablet 1   No facility-administered medications prior to visit.    ROS: Review of Systems  Constitutional: Negative for activity change, appetite change, chills, fatigue and unexpected weight change.  HENT: Negative for congestion, mouth sores and sinus pressure.   Eyes: Negative for visual disturbance.  Respiratory: Negative for cough and chest tightness.   Gastrointestinal: Negative for abdominal pain and nausea.  Genitourinary: Negative for difficulty urinating, frequency and vaginal pain.  Musculoskeletal: Negative for back pain and gait problem.  Skin: Negative for pallor and rash.  Neurological: Negative for dizziness, tremors, weakness, numbness and headaches.  Psychiatric/Behavioral: Negative for confusion, sleep disturbance and suicidal ideas.    Objective:  BP (!) 170/90 (BP Location: Left Arm, Patient Position: Sitting, Cuff Size: Normal)   Pulse (!) 58   Temp 97.9 F (36.6 C) (Oral)   Ht 5'  3" (1.6 m)   Wt 120 lb (54.4 kg)   SpO2 96%   BMI 21.26 kg/m   BP Readings from Last 3 Encounters:  04/30/20 (!) 170/90  03/18/20 136/82  02/05/20 128/84    Wt Readings from Last 3 Encounters:  04/30/20 120 lb (54.4 kg)  03/18/20 122 lb (55.3 kg)  02/05/20 120 lb (54.4 kg)    Physical Exam Constitutional:      General: She is not in acute distress.    Appearance: She is well-developed.  HENT:     Head: Normocephalic.     Right Ear: External ear normal.     Left Ear: External ear normal.     Nose: Nose normal.  Eyes:     General:        Right eye: No discharge.        Left eye: No discharge.     Conjunctiva/sclera: Conjunctivae normal.     Pupils: Pupils are equal, round, and reactive to light.  Neck:     Thyroid: No thyromegaly.     Vascular: No JVD.     Trachea: No tracheal deviation.  Cardiovascular:     Rate and Rhythm: Normal rate and regular rhythm.     Heart sounds: Normal heart sounds.  Pulmonary:     Effort: No respiratory distress.     Breath sounds: No stridor. No wheezing.  Abdominal:     General: Bowel sounds are  normal. There is no distension.     Palpations: Abdomen is soft. There is no mass.     Tenderness: There is no abdominal tenderness. There is no guarding or rebound.  Musculoskeletal:        General: No tenderness.     Cervical back: Normal range of motion and neck supple.  Lymphadenopathy:     Cervical: No cervical adenopathy.  Skin:    Findings: No erythema or rash.  Neurological:     Cranial Nerves: No cranial nerve deficit.     Motor: No abnormal muscle tone.     Coordination: Coordination normal.     Deep Tendon Reflexes: Reflexes normal.  Psychiatric:        Behavior: Behavior normal.        Thought Content: Thought content normal.        Judgment: Judgment normal.     Lab Results  Component Value Date   WBC 11.2 (H) 01/28/2020   HGB 12.0 01/28/2020   HCT 34.1 01/28/2020   PLT 342 01/28/2020   GLUCOSE 94 01/28/2020    CHOL 165 07/11/2019   TRIG 71 01/14/2020   HDL 70.40 07/11/2019   LDLCALC 77 07/11/2019   ALT 21 01/28/2020   AST 22 01/28/2020   NA 139 01/28/2020   K 4.2 01/28/2020   CL 99 01/28/2020   CREATININE 0.61 01/28/2020   BUN 15 01/28/2020   CO2 26 01/28/2020   TSH 2.53 01/10/2019   INR 1.41 08/07/2015    MM 3D SCREEN BREAST BILATERAL  Result Date: 02/14/2020 CLINICAL DATA:  Screening. EXAM: DIGITAL SCREENING BILATERAL MAMMOGRAM WITH TOMO AND CAD COMPARISON:  Previous exam(s). ACR Breast Density Category b: There are scattered areas of fibroglandular density. FINDINGS: There are no findings suspicious for malignancy. Images were processed with CAD. IMPRESSION: No mammographic evidence of malignancy. A result letter of this screening mammogram will be mailed directly to the patient. RECOMMENDATION: Screening mammogram in one year. (Code:SM-B-01Y) BI-RADS CATEGORY  1: Negative. Electronically Signed   By: Curlene Dolphin M.D.   On: 02/14/2020 16:34    Assessment & Plan:    Vicki Kehr, MD

## 2020-04-30 NOTE — Addendum Note (Signed)
Addended by: Boris Lown B on: 04/30/2020 08:17 AM   Modules accepted: Orders

## 2020-04-30 NOTE — Assessment & Plan Note (Signed)
On Xarelto 

## 2020-04-30 NOTE — Assessment & Plan Note (Signed)
Check BP at home

## 2020-06-03 IMAGING — MG DIGITAL SCREENING BILAT W/ TOMO W/ CAD
1 series · 2 of 5 positions shown · non-contrast
Comparison: Previous exam(s).

CLINICAL DATA: Screening.

EXAM:
DIGITAL SCREENING BILATERAL MAMMOGRAM WITH TOMO AND CAD

[R CC tomo · 2 of 59 frames shown]
[frame 20/59]
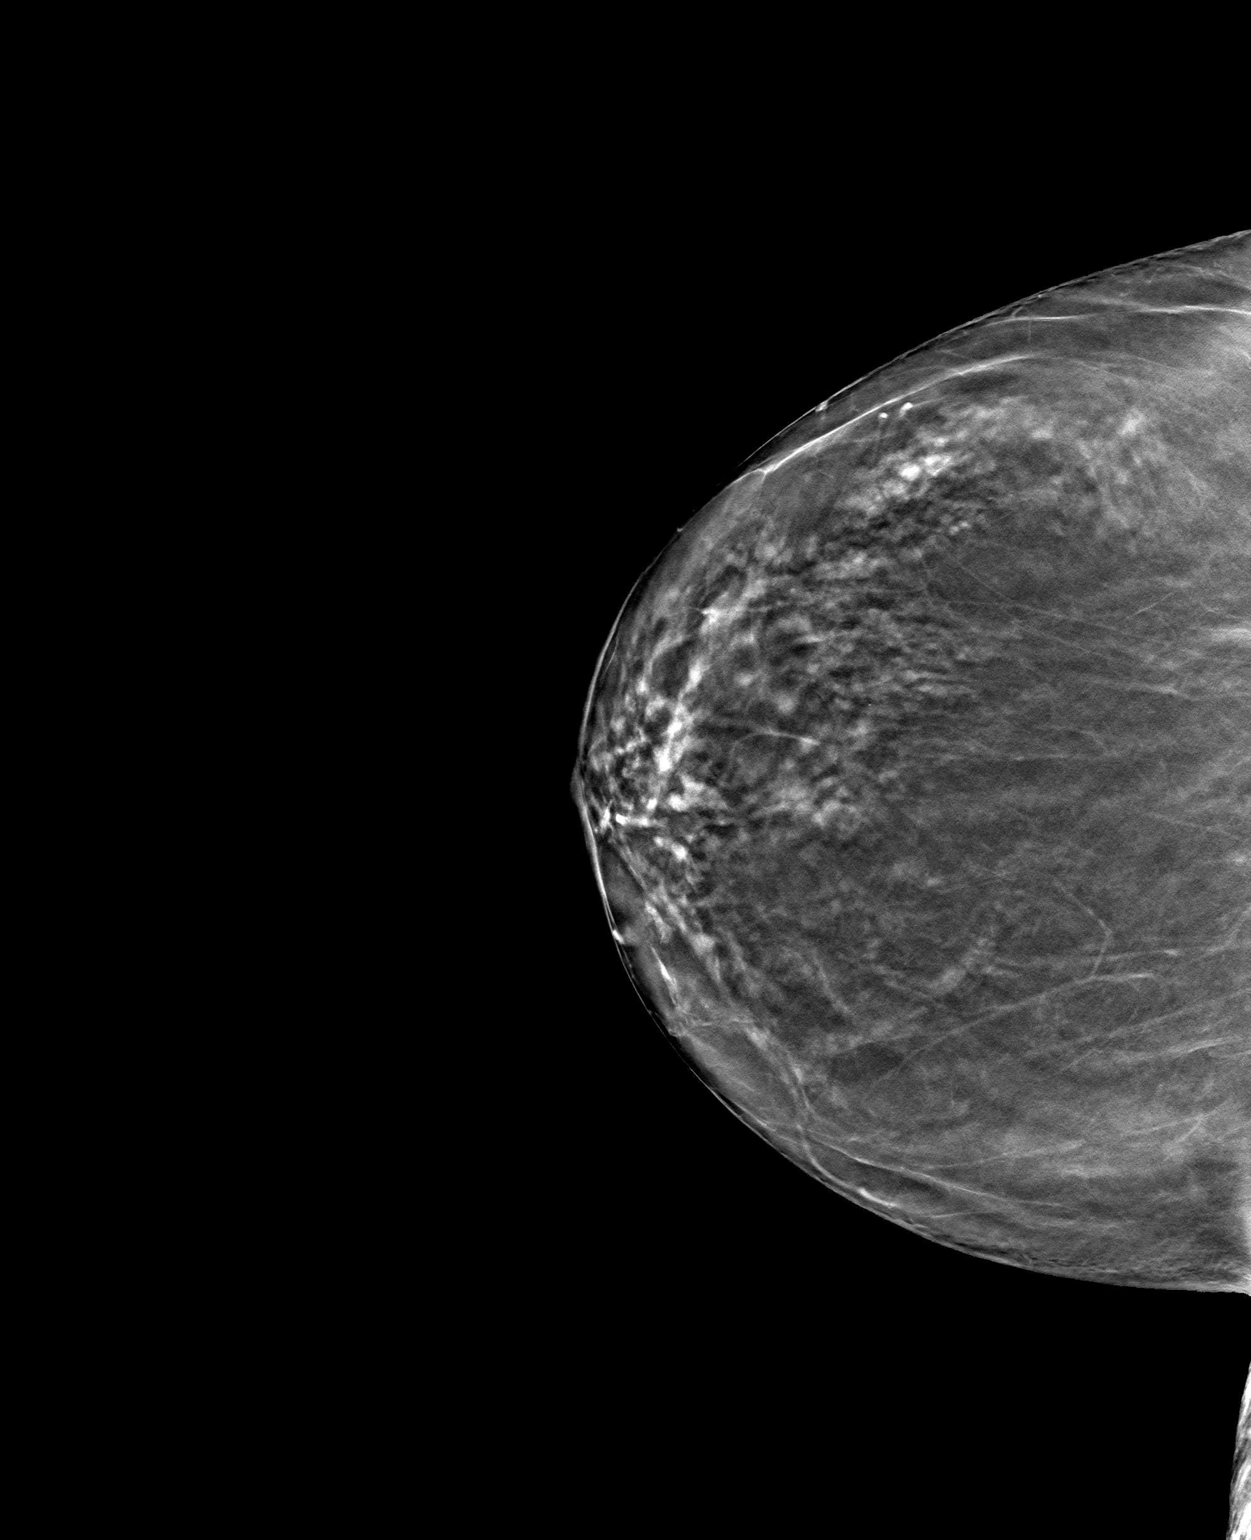
[frame 30/59]
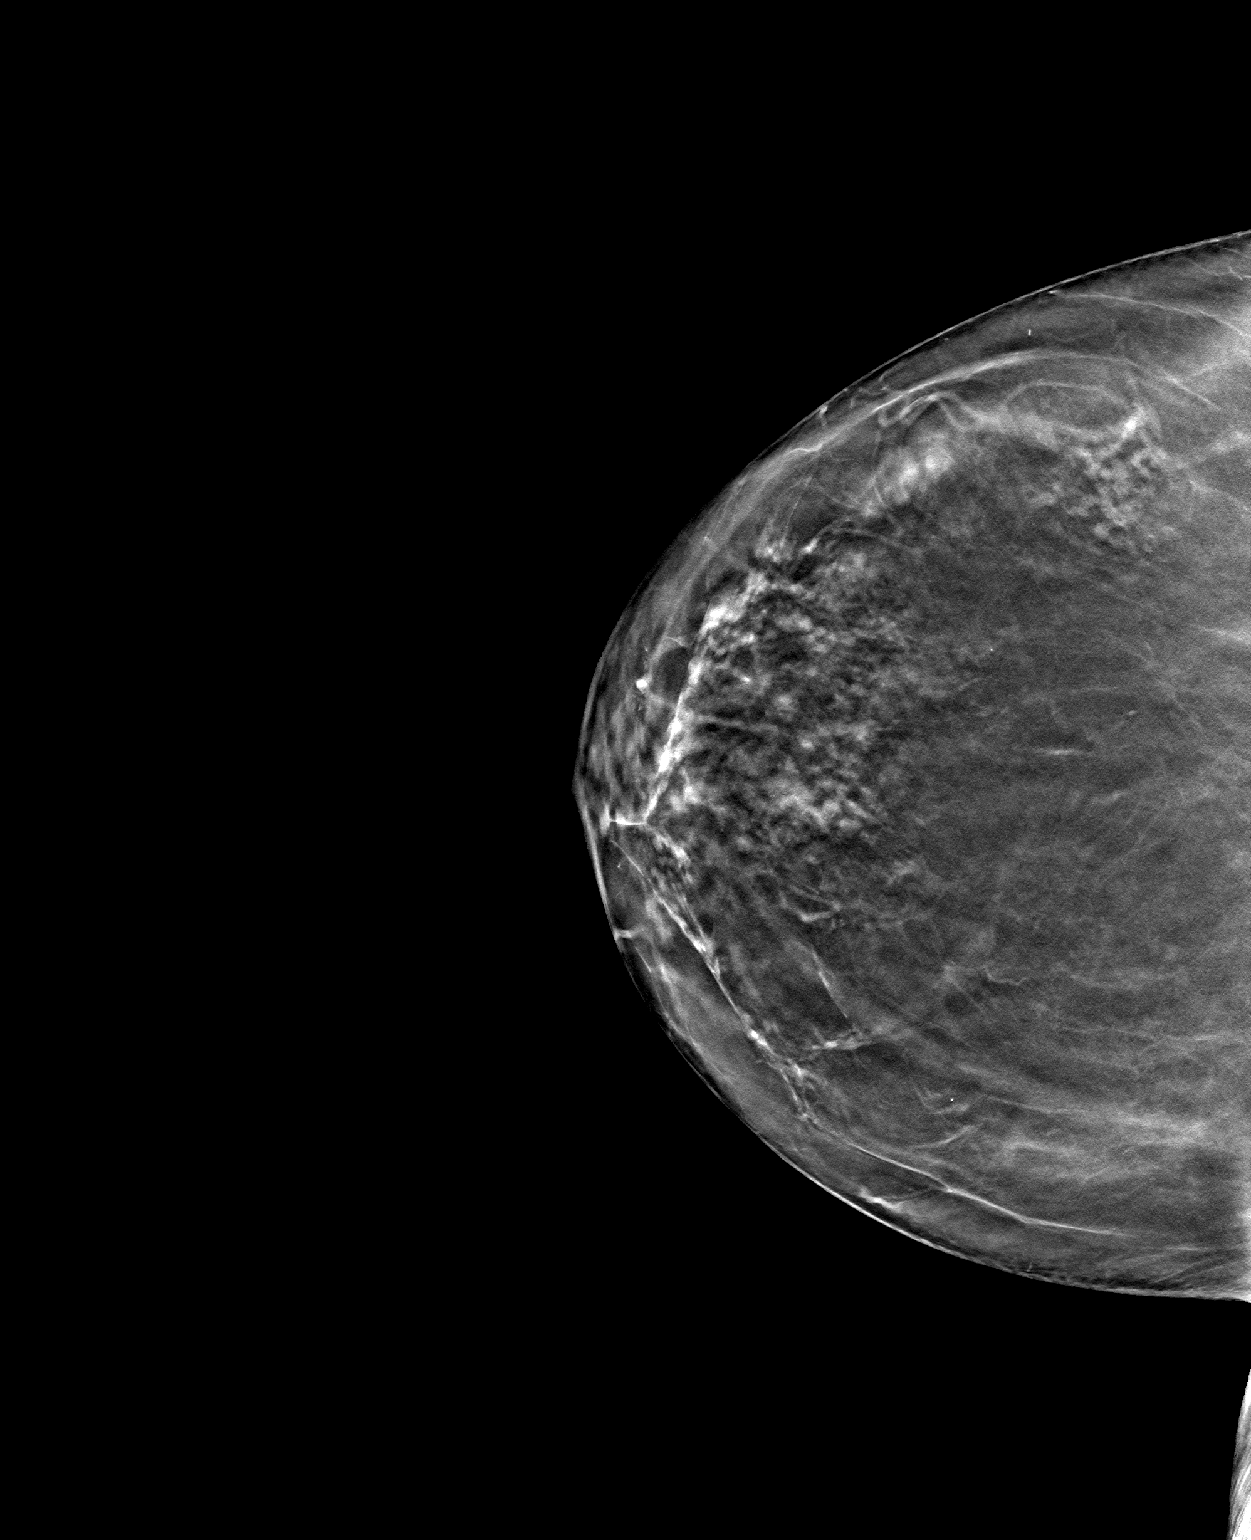

[2 of 5 positions shown; findings below may reference images not displayed]

ACR Breast Density Category b: There are scattered areas of
fibroglandular density.
FINDINGS: There are no findings suspicious for malignancy. Images were
processed with CAD.
IMPRESSION: No mammographic evidence of malignancy. A result letter of this
screening mammogram will be mailed directly to the patient.

RECOMMENDATION:
Screening mammogram in one year. (Code:CN-[id number])

BI-RADS CATEGORY  1: Negative.

## 2020-06-12 DIAGNOSIS — H353112 Nonexudative age-related macular degeneration, right eye, intermediate dry stage: Secondary | ICD-10-CM | POA: Diagnosis not present

## 2020-06-12 DIAGNOSIS — H2513 Age-related nuclear cataract, bilateral: Secondary | ICD-10-CM | POA: Diagnosis not present

## 2020-06-12 DIAGNOSIS — H25013 Cortical age-related cataract, bilateral: Secondary | ICD-10-CM | POA: Diagnosis not present

## 2020-06-12 DIAGNOSIS — H35033 Hypertensive retinopathy, bilateral: Secondary | ICD-10-CM | POA: Diagnosis not present

## 2020-06-12 DIAGNOSIS — H353221 Exudative age-related macular degeneration, left eye, with active choroidal neovascularization: Secondary | ICD-10-CM | POA: Diagnosis not present

## 2020-06-12 DIAGNOSIS — H2512 Age-related nuclear cataract, left eye: Secondary | ICD-10-CM | POA: Diagnosis not present

## 2020-06-24 DIAGNOSIS — H25812 Combined forms of age-related cataract, left eye: Secondary | ICD-10-CM | POA: Diagnosis not present

## 2020-06-24 DIAGNOSIS — H2512 Age-related nuclear cataract, left eye: Secondary | ICD-10-CM | POA: Diagnosis not present

## 2020-06-27 ENCOUNTER — Ambulatory Visit (INDEPENDENT_AMBULATORY_CARE_PROVIDER_SITE_OTHER): Payer: PPO

## 2020-06-27 DIAGNOSIS — Z0000 Encounter for general adult medical examination without abnormal findings: Secondary | ICD-10-CM

## 2020-06-27 NOTE — Progress Notes (Signed)
I connected with Vicki [name] today by telephone and verified that I am speaking with the correct person using two identifiers. Location patient: home Location provider: work Persons participating in the virtual visit: Vicki [name]  I discussed the limitations, risks, security and privacy concerns of performing an evaluation and management service by telephone and the availability of in person appointments. I also discussed with the patient that there may be a patient responsible charge related to this service. The patient expressed understanding and verbally consented to this telephonic visit.    Interactive audio and video telecommunications were attempted between this provider and patient, however failed, due to patient having technical difficulties OR patient did not have access to video capability.  We continued and completed visit with audio only.  Some vital signs may be absent or patient reported.   Time Spent with patient on telephone encounter: 20 minutes  Subjective:   Vicki [name] is a 74 y.o. female who presents for Medicare Annual (Subsequent) preventive examination.  Review of Systems    No ROS. Medicare Wellness Virtual Visit. Additional risk factors are reflected in social history. Cardiac Risk Factors include: advanced age (>103men, >85 women);dyslipidemia;family history of premature cardiovascular disease;hypertension     Objective:    There were no vitals filed for this visit. There is no height or weight on file to calculate BMI.  Advanced Directives 06/27/2020 01/14/2020 01/14/2020 01/11/2020 01/17/2019 01/16/2018 05/07/2016  Does Patient Have a Medical Advance Directive? Yes No No No No No No  Type of Advance Directive - - - - - - -  Does patient want to make changes to medical advance directive? No - Patient declined - - - - Yes (ED - Information included in AVS) -  Copy of [place] in Chart? - - - - - - -  Would patient like information  on creating a medical advance directive? - No - Patient declined - - No - Patient declined - No - patient declined information    Current Medications (verified) Outpatient Encounter Medications as of 06/27/2020  Medication Sig  . acetaminophen (TYLENOL) 325 MG tablet Take 500 mg by mouth every 6 (six) hours as needed for mild pain or fever.   . cholecalciferol (VITAMIN D) 1000 UNITS tablet Take 1,000 Units by mouth 2 (two) times daily.  . famotidine (PEPCID) 10 MG tablet Take 10 mg by mouth 2 (two) times daily.  Marland Kitchen loratadine (CLARITIN) 10 MG tablet Take 10 mg by mouth daily.  . Melatonin 3 MG TABS Take 3 mg by mouth at bedtime as needed (for sleep).   . Multiple Vitamins-Minerals (ICAPS) TABS Take 1 tablet by mouth 2 (two) times daily.   . rivaroxaban (XARELTO) 20 MG TABS tablet Take 1 tablet (20 mg total) by mouth daily.  . simvastatin (ZOCOR) 20 MG tablet Take 1 tablet (20 mg total) by mouth at bedtime.   No facility-administered encounter medications on file as of 06/27/2020.    Allergies (verified) Codeine   History: Past Medical History:  Diagnosis Date  . Allergic rhinitis   . Anxiety   . Aortic insufficiency   . Breast cancer [facility]) 2008   Left Breast Cancer  . Contact dermatitis   . Diverticulosis of colon   . DJD (degenerative joint disease)   . Dysrhythmia    A-fib  . GERD (gastroesophageal reflux disease)   . Hx of colonic polyps   . Hyperlipidemia   . Macular degeneration, bilateral   .  Osteopenia   . Paroxysmal atrial fibrillation (HCC)    A. fib is now persistent.  . Personal history of radiation therapy 2008   Left Breast Cancer  . Visit for monitoring Tikosyn therapy 09/06/2015   Past Surgical History:  Procedure Laterality Date  . ABDOMINAL HYSTERECTOMY  1984   with 1 ovary removal  . BREAST LUMPECTOMY Left 08/2007    with node bx  . ELECTROPHYSIOLOGIC STUDY N/A 05/07/2016   Procedure: Atrial Fibrillation Ablation;  Surgeon: Will Meredith Leeds, MD;   Location: [place] [facility];  Service: Cardiovascular;  Laterality: N/A;   Family History  Problem Relation Age of Onset  . Heart disease Father 8  . Heart attack Father   . Other Mother 68       AVR  . Heart disease Mother   . Clotting disorder Mother 52  . Breast cancer Sister   . Lymphoma Sister   . Hypertension Sister   . Healthy Brother   . Other Maternal Grandmother   . Colon cancer Neg Hx   . Esophageal cancer Neg Hx   . Pancreatic cancer Neg Hx    Social History   Socioeconomic History  . Marital status: Married    Spouse name: Lezli Danek  . Number of children: 3  . Years of education: Not on file  . Highest education level: Not on file  Occupational History  . Occupation: ACCOUNTING    Employer: [employer]  Tobacco Use  . Smoking status: Former Smoker    Types: Cigarettes    Quit date: 12/06/1976    Years since quitting: 43.5  . Smokeless tobacco: Never Used  . Tobacco comment: smoked approx 10 years  Vaping Use  . Vaping Use: Never used  Substance and Sexual Activity  . Alcohol use: No    Alcohol/week: 0.0 standard drinks  . Drug use: No  . Sexual activity: Not Currently  Other Topics Concern  . Not on file  Social History Narrative  . Not on file   Social Determinants of Health   Financial Resource Strain: Low Risk   . Difficulty of Paying Living Expenses: Not hard at all  Food Insecurity: No Food Insecurity  . Worried About Charity fundraiser in the Last Year: Never true  . Ran Out of Food in the Last Year: Never true  Transportation Needs: No Transportation Needs  . Lack of Transportation (Medical): No  . Lack of Transportation (Non-Medical): No  Physical Activity:   . Days of Exercise per Week:   . Minutes of Exercise per Session:   Stress: No Stress Concern Present  . Feeling of Stress : Not at all  Social Connections: Socially Integrated  . Frequency of Communication with Friends and Family: More than three times a week  .  Frequency of Social Gatherings with Friends and Family: More than three times a week  . Attends Religious Services: More than 4 times per year  . Active Member of Clubs or Organizations: Yes  . Attends Archivist Meetings: More than 4 times per year  . Marital Status: Married    Tobacco Counseling Counseling given: Not Answered Comment: smoked approx 10 years   Clinical Intake:  Pre-visit preparation completed: Yes  Pain : No/denies pain     Nutritional Risks: Nausea/ vomitting/ diarrhea (Diarrhea) Diabetes: No CBG done?: No Did pt. bring in CBG monitor from home?: No  How often do you need to have someone help you when you read instructions,  pamphlets, or other written materials from your doctor or pharmacy?: 1 - Never What is the last grade level you completed in school?: HSG  Diabetic? no  Interpreter Needed?: No  Information entered by :: Jenell Dobransky N. Dekari Bures, LPN   Activities of Daily Living In your present state of health, do you have any difficulty performing the following activities: 06/27/2020 01/14/2020  Hearing? N N  Vision? N N  Difficulty concentrating or making decisions? N N  Walking or climbing stairs? N N  Dressing or bathing? N N  Doing errands, shopping? N N  Preparing Food and eating ? N -  Using the Toilet? N -  In the past six months, have you accidently leaked urine? N -  Do you have problems with loss of bowel control? Y -  Comment Having issues with IBS -  Managing your Medications? N -  Managing your Finances? N -  Housekeeping or managing your Housekeeping? N -  Some recent data might be hidden    Patient Care Team: Plotnikov, Evie Lacks, MD as PCP - General (Internal Medicine) Constance Haw, MD as PCP - Electrophysiology (Cardiology) Lorretta Harp, MD as Consulting Physician (Cardiology)  Indicate any recent Medical Services you may have received from other than [facility] providers in the past year (date may be  approximate).     Assessment:   This is a routine wellness examination for Vicki [name].  Hearing/Vision screen No exam data present  Dietary issues and exercise activities discussed: Current Exercise Habits: The patient does not participate in regular exercise at present  Goals    .  DIET - INCREASE WATER INTAKE (pt-stated)      I would like to get back to being more active and start back walking.    .  Exercise 150 minutes per week (moderate activity)      [facility] walk [place] mile in the neighborhood 2 to 3 days    .  Patient Stated      Increase the amount of water I drink. I will start by drinking 2 glasses of water daily. Continue to walk, do yard work, enjoy life, family, and travel.    .  Patient Stated      Maintain current health status and stay as independent as possible. Continue to stay involve with my family.       Depression Screen PHQ 2/9 Scores 06/27/2020 02/05/2020 01/17/2019 01/16/2018 07/08/2017 07/07/2016 07/04/2015  PHQ - 2 Score 0 0 0 0 0 0 0  PHQ- 9 Score - - - 0 - - -    Fall Risk Fall Risk  06/27/2020 02/05/2020 01/17/2019 01/16/2018 07/08/2017  Falls in the past year? 0 0 0 Yes Yes  Number falls in past yr: 0 - - - 1  Injury with Fall? 0 - - Yes Yes  Risk for fall due to : No Fall Risks - Impaired balance/gait;Impaired mobility - -  Follow up Falls evaluation completed Falls evaluation completed - - -    Any stairs in or around the home? No  If so, are there any without handrails? No  Home free of loose throw rugs in walkways, pet beds, electrical cords, etc? Yes  Adequate lighting in your home to reduce risk of falls? Yes   ASSISTIVE DEVICES UTILIZED TO PREVENT FALLS:  Life alert? No  Use of a cane, walker or w/c? No  Grab bars in the bathroom? Yes  Shower chair or bench in shower? Yes  Elevated toilet  seat or a handicapped toilet? No   TIMED UP AND GO:  Was the test performed? No .  Length of time to ambulate 10 feet: 0 sec.   Gait steady and fast  without use of assistive device (Per patient)  Cognitive Function: MMSE - Mini Mental State Exam 01/16/2018  Orientation to time 5  Orientation to Place 5  Registration 3  Attention/ Calculation 5  Recall 3  Language- name 2 objects 2  Language- repeat 1  Language- follow 3 step command 3  Language- read & follow direction 1  Write a sentence 1  Copy design 1  Total score 30        Immunizations Immunization History  Administered Date(s) Administered  . Influenza Split 09/17/2011, 09/21/2012  . Influenza Whole 09/07/2010, 09/19/2016  . Influenza, High Dose Seasonal PF 08/30/2017, 08/30/2018, 09/05/2019  . Influenza-Unspecified 09/05/2014, 09/15/2015  . Pneumococcal Conjugate-13 07/04/2015  . Pneumococcal Polysaccharide-23 03/19/2014  . Td 07/04/2015  . Zoster 09/28/2016  . Zoster Recombinat (Shingrix) 08/09/2019, 11/06/2019    TDAP status: Up to date Flu Vaccine status: Up to date Pneumococcal vaccine status: Up to date Covid-19 vaccine status: Completed vaccines  Qualifies for Shingles Vaccine? Yes   Zostavax completed Yes   Shingrix Completed?: Yes  Screening Tests Health Maintenance  Topic Date Due  . COVID-19 Vaccine (1) Never done  . INFLUENZA VACCINE  07/06/2020  . MAMMOGRAM  02/13/2022  . TETANUS/TDAP  07/03/2025  . DEXA SCAN  Completed  . Hepatitis C Screening  Completed  . PNA vac Low Risk Adult  Completed    Health Maintenance  Health Maintenance Due  Topic Date Due  . COVID-19 Vaccine (1) Never done    Colorectal cancer screening: No longer required.  Mammogram status: Completed 02/14/2020. Repeat every year Bone Density: No longer required  Lung Cancer Screening: (Low Dose CT Chest recommended if Age 52-80 years, 30 pack-year currently smoking OR have quit w/in 15years.) does not qualify.   Lung Cancer Screening Referral: no  Additional Screening:  Hepatitis C Screening: does qualify; Completed yes  Vision Screening: Recommended  annual ophthalmology exams for early detection of glaucoma and other disorders of the eye. Is the patient up to date with their annual eye exam?  Yes  Who is the provider or what is the name of the office in which the patient attends annual eye exams? [facility] If pt is not established with a provider, would they like to be referred to a provider to establish care? No .   Dental Screening: Recommended annual dental exams for proper oral hygiene  Community Resource Referral / Chronic Care Management: CRR required this visit?  No   CCM required this visit?  No      Plan:     I have personally reviewed and noted the following in the patient's chart:   . Medical and social history . Use of alcohol, tobacco or illicit drugs  . Current medications and supplements . Functional ability and status . Nutritional status . Physical activity . Advanced directives . List of other physicians . Hospitalizations, surgeries, and ER visits in previous 12 months . Vitals . Screenings to include cognitive, depression, and falls . Referrals and appointments  In addition, I have reviewed and discussed with patient certain preventive protocols, quality metrics, and best practice recommendations. A written personalized care plan for preventive services as well as general preventive health recommendations were provided to patient.     Sheral Flow,  LPN   [id number]/46/[id number]   Nurse Notes:  Patient is cogitatively intact. There were no vitals filed for this visit. There is no height or weight on file to calculate BMI. Patient stated that she has no issues with gait or balance; does not use any assistive devices.

## 2020-06-27 NOTE — Patient Instructions (Addendum)
[name] , Thank you for taking time to come for your Medicare Wellness Visit. I appreciate your ongoing commitment to your health goals. Please review the following plan we discussed and let me know if I can assist you in the future.   Screening recommendations/referrals: Colonoscopy: 10/01/2019 Mammogram: 02/14/2020 Bone Density: 02/16/2012 Recommended yearly ophthalmology/optometry visit for glaucoma screening and checkup Recommended yearly dental visit for hygiene and checkup  Vaccinations: Influenza vaccine: 09/05/2019 Pneumococcal vaccine: completed Tdap vaccine: 07/04/2015 Shingles vaccine: completed   Covid-19: completed  Advanced directives: Please bring a copy of your health care power of attorney and living will to the office at your convenience.  Conditions/risks identified: Yes; Please continue to do your personal lifestyle choices by: daily care of teeth and gums, regular physical activity (goal should be 5 days a week for 30 minutes), eat a healthy diet, avoid tobacco and drug use, limiting any alcohol intake, taking a low-dose aspirin (if not allergic or have been advised by your provider otherwise) and taking vitamins and minerals as recommended by your provider. Continue doing brain stimulating activities (puzzles, reading, adult coloring books, staying active) to keep memory sharp. Continue to eat heart healthy diet (full of fruits, vegetables, whole grains, lean protein, water--limit salt, fat, and sugar intake) and increase physical activity as tolerated.  Next appointment: Please schedule your next Medicare Wellness Visit with your Nurse Health Advisor in 1 year.   Preventive Care 45 Years and Older, Female Preventive care refers to lifestyle choices and visits with your health care provider that can promote health and wellness. What does preventive care include?  A yearly physical exam. This is also called an annual well check.  Dental exams once or twice a  year.  Routine eye exams. Ask your health care provider how often you should have your eyes checked.  Personal lifestyle choices, including:  Daily care of your teeth and gums.  Regular physical activity.  Eating a healthy diet.  Avoiding tobacco and drug use.  Limiting alcohol use.  Practicing safe sex.  Taking low-dose aspirin every day.  Taking vitamin and mineral supplements as recommended by your health care provider. What happens during an annual well check? The services and screenings done by your health care provider during your annual well check will depend on your age, overall health, lifestyle risk factors, and family history of disease. Counseling  Your health care provider may ask you questions about your:  Alcohol use.  Tobacco use.  Drug use.  Emotional well-being.  Home and relationship well-being.  Sexual activity.  Eating habits.  History of falls.  Memory and ability to understand (cognition).  Work and work Statistician.  Reproductive health. Screening  You may have the following tests or measurements:  Height, weight, and BMI.  Blood pressure.  Lipid and cholesterol levels. These may be checked every 5 years, or more frequently if you are over 44 years old.  Skin check.  Lung cancer screening. You may have this screening every year starting at age 17 if you have a 30-pack-year history of smoking and currently smoke or have quit within the past 15 years.  Fecal occult blood test (FOBT) of the stool. You may have this test every year starting at age 63.  Flexible sigmoidoscopy or colonoscopy. You may have a sigmoidoscopy every 5 years or a colonoscopy every 10 years starting at age 54.  Hepatitis C blood test.  Hepatitis B blood test.  Sexually transmitted disease (STD) testing.  Diabetes screening. This  is done by checking your blood sugar (glucose) after you have not eaten for a while (fasting). You may have this done every 1-3  years.  Bone density scan. This is done to screen for osteoporosis. You may have this done starting at age 48.  Mammogram. This may be done every 1-2 years. Talk to your health care provider about how often you should have regular mammograms. Talk with your health care provider about your test results, treatment options, and if necessary, the need for more tests. Vaccines  Your health care provider may recommend certain vaccines, such as:  Influenza vaccine. This is recommended every year.  Tetanus, diphtheria, and acellular pertussis (Tdap, Td) vaccine. You may need a Td booster every 10 years.  Zoster vaccine. You may need this after age 67.  Pneumococcal 13-valent conjugate (PCV13) vaccine. One dose is recommended after age 2.  Pneumococcal polysaccharide (PPSV23) vaccine. One dose is recommended after age 59. Talk to your health care provider about which screenings and vaccines you need and how often you need them. This information is not intended to replace advice given to you by your health care provider. Make sure you discuss any questions you have with your health care provider. Document Released: 12/19/2015 Document Revised: 08/11/2016 Document Reviewed: 09/23/2015 Elsevier Interactive Patient Education  2017 [employer] Prevention in the Home Falls can cause injuries. They can happen to people of all ages. There are many things you can do to make your home safe and to help prevent falls. What can I do on the outside of my home?  Regularly fix the edges of walkways and driveways and fix any cracks.  Remove anything that might make you trip as you walk through a door, such as a raised step or threshold.  Trim any bushes or trees on the path to your home.  Use bright outdoor lighting.  Clear any walking paths of anything that might make someone trip, such as rocks or tools.  Regularly check to see if handrails are loose or broken. Make sure that both sides of any  steps have handrails.  Any raised decks and porches should have guardrails on the edges.  Have any leaves, snow, or ice cleared regularly.  Use sand or salt on walking paths during winter.  Clean up any spills in your garage right away. This includes oil or grease spills. What can I do in the bathroom?  Use night lights.  Install grab bars by the toilet and in the tub and shower. Do not use towel bars as grab bars.  Use non-skid mats or decals in the tub or shower.  If you need to sit down in the shower, use a plastic, non-slip stool.  Keep the floor dry. Clean up any water that spills on the floor as soon as it happens.  Remove soap buildup in the tub or shower regularly.  Attach bath mats securely with double-sided non-slip rug tape.  Do not have throw rugs and other things on the floor that can make you trip. What can I do in the bedroom?  Use night lights.  Make sure that you have a light by your bed that is easy to reach.  Do not use any sheets or blankets that are too big for your bed. They should not hang down onto the floor.  Have a firm chair that has side arms. You can use this for support while you get dressed.  Do not have throw rugs and other  things on the floor that can make you trip. What can I do in the kitchen?  Clean up any spills right away.  Avoid walking on wet floors.  Keep items that you use a lot in easy-to-reach places.  If you need to reach something above you, use a strong step stool that has a grab bar.  Keep electrical cords out of the way.  Do not use floor polish or wax that makes floors slippery. If you must use wax, use non-skid floor wax.  Do not have throw rugs and other things on the floor that can make you trip. What can I do with my stairs?  Do not leave any items on the stairs.  Make sure that there are handrails on both sides of the stairs and use them. Fix handrails that are broken or loose. Make sure that handrails are  as long as the stairways.  Check any carpeting to make sure that it is firmly attached to the stairs. Fix any carpet that is loose or worn.  Avoid having throw rugs at the top or bottom of the stairs. If you do have throw rugs, attach them to the floor with carpet tape.  Make sure that you have a light switch at the top of the stairs and the bottom of the stairs. If you do not have them, ask someone to add them for you. What else can I do to help prevent falls?  Wear shoes that:  Do not have high heels.  Have rubber bottoms.  Are comfortable and fit you well.  Are closed at the toe. Do not wear sandals.  If you use a stepladder:  Make sure that it is fully opened. Do not climb a closed stepladder.  Make sure that both sides of the stepladder are locked into place.  Ask someone to hold it for you, if possible.  Clearly mark and make sure that you can see:  Any grab bars or handrails.  First and last steps.  Where the edge of each step is.  Use tools that help you move around (mobility aids) if they are needed. These include:  Canes.  Walkers.  Scooters.  Crutches.  Turn on the lights when you go into a dark area. Replace any light bulbs as soon as they burn out.  Set up your furniture so you have a clear path. Avoid moving your furniture around.  If any of your floors are uneven, fix them.  If there are any pets around you, be aware of where they are.  Review your medicines with your doctor. Some medicines can make you feel dizzy. This can increase your chance of falling. Ask your doctor what other things that you can do to help prevent falls. This information is not intended to replace advice given to you by your health care provider. Make sure you discuss any questions you have with your health care provider. Document Released: 09/18/2009 Document Revised: 04/29/2016 Document Reviewed: 12/27/2014 Elsevier Interactive Patient Education  2017 [employer].

## 2020-07-21 DIAGNOSIS — H353221 Exudative age-related macular degeneration, left eye, with active choroidal neovascularization: Secondary | ICD-10-CM | POA: Diagnosis not present

## 2020-07-21 DIAGNOSIS — H25011 Cortical age-related cataract, right eye: Secondary | ICD-10-CM | POA: Diagnosis not present

## 2020-07-21 DIAGNOSIS — H2511 Age-related nuclear cataract, right eye: Secondary | ICD-10-CM | POA: Diagnosis not present

## 2020-07-21 LAB — HM DIABETES EYE EXAM

## 2020-07-23 ENCOUNTER — Other Ambulatory Visit: Payer: Self-pay

## 2020-07-23 ENCOUNTER — Encounter (INDEPENDENT_AMBULATORY_CARE_PROVIDER_SITE_OTHER): Payer: PPO | Admitting: Ophthalmology

## 2020-07-23 DIAGNOSIS — I10 Essential (primary) hypertension: Secondary | ICD-10-CM | POA: Diagnosis not present

## 2020-07-23 DIAGNOSIS — H35033 Hypertensive retinopathy, bilateral: Secondary | ICD-10-CM

## 2020-07-23 DIAGNOSIS — H2511 Age-related nuclear cataract, right eye: Secondary | ICD-10-CM | POA: Diagnosis not present

## 2020-07-23 DIAGNOSIS — H353221 Exudative age-related macular degeneration, left eye, with active choroidal neovascularization: Secondary | ICD-10-CM

## 2020-07-23 DIAGNOSIS — H353112 Nonexudative age-related macular degeneration, right eye, intermediate dry stage: Secondary | ICD-10-CM

## 2020-07-23 DIAGNOSIS — H43813 Vitreous degeneration, bilateral: Secondary | ICD-10-CM

## 2020-07-29 DIAGNOSIS — H2511 Age-related nuclear cataract, right eye: Secondary | ICD-10-CM | POA: Diagnosis not present

## 2020-07-29 DIAGNOSIS — H25811 Combined forms of age-related cataract, right eye: Secondary | ICD-10-CM | POA: Diagnosis not present

## 2020-07-29 DIAGNOSIS — H25011 Cortical age-related cataract, right eye: Secondary | ICD-10-CM | POA: Diagnosis not present

## 2020-08-01 ENCOUNTER — Encounter: Payer: Self-pay | Admitting: Internal Medicine

## 2020-08-28 DIAGNOSIS — D225 Melanocytic nevi of trunk: Secondary | ICD-10-CM | POA: Diagnosis not present

## 2020-08-28 DIAGNOSIS — L820 Inflamed seborrheic keratosis: Secondary | ICD-10-CM | POA: Diagnosis not present

## 2020-09-09 ENCOUNTER — Other Ambulatory Visit: Payer: Self-pay | Admitting: Internal Medicine

## 2020-10-09 ENCOUNTER — Encounter: Payer: Self-pay | Admitting: Cardiology

## 2020-10-09 ENCOUNTER — Ambulatory Visit: Payer: PPO | Admitting: Cardiology

## 2020-10-09 ENCOUNTER — Other Ambulatory Visit: Payer: Self-pay

## 2020-10-09 VITALS — BP 130/82 | HR 60 | Ht 63.0 in | Wt 120.2 lb

## 2020-10-09 DIAGNOSIS — I4819 Other persistent atrial fibrillation: Secondary | ICD-10-CM

## 2020-10-09 NOTE — Patient Instructions (Signed)
Medication Instructions:  °Your physician recommends that you continue on your current medications as directed. Please refer to the Current Medication list given to you today. ° °*If you need a refill on your cardiac medications before your next appointment, please call your pharmacy* ° ° °Lab Work: °None ordered °If you have labs (blood work) drawn today and your tests are completely normal, you will receive your results only by: °MyChart Message (if you have MyChart) OR °A paper copy in the mail °If you have any lab test that is abnormal or we need to change your treatment, we will call you to review the results. ° ° °Testing/Procedures: °None ordered ° ° °Follow-Up: °At [facility], you and your health needs are our priority.  As part of our continuing mission to provide you with exceptional heart care, we have created designated Provider Care Teams.  These Care Teams include your primary Cardiologist (physician) and Advanced Practice Providers (APPs -  Physician Assistants and Nurse Practitioners) who all work together to provide you with the care you need, when you need it. ° °Your next appointment:   °1 year(s) ° °The format for your next appointment:   °In Person ° °Provider:   °Will Camnitz, MD ° ° ° °Thank you for choosing [facility]!! ° ° °Kalicia Dufresne, RN °[phone] ° ° °Other Instructions ° °

## 2020-10-09 NOTE — Progress Notes (Signed)
Electrophysiology Office Note   Date:  10/09/2020   ID:  Vicki [name], DOB Apr 27, 1946, MRN [id number]  PCP:  Cassandria Anger, MD  Cardiologist:  Quay Burow Primary Electrophysiologist: Constance Haw, MD    No chief complaint on file.    History of Present Illness: Vicki [name] is a 74 y.o. female who presents today for electrophysiology evaluation.   She has a history significant for persistent atrial fibrillation, hyperlipidemia, and aortic insufficiency.  She is currently on Xarelto for a CHA2DS2-VASc of 2.  She is status post AF ablation 05/07/2016.    Today, denies symptoms of palpitations, chest pain, shortness of breath, orthopnea, PND, lower extremity edema, claudication, dizziness, presyncope, syncope, bleeding, or neurologic sequela. The patient is tolerating medications without difficulties.  She is doing well.  She has no chest pain or shortness of breath.  She has noted no further episodes of atrial fibrillation.  Unfortunately in February she was diagnosed with Covid and required hospitalization.  She says that it took her quite a while to recover from her infection, but she is now feeling well and has started to exercise.   Past Medical History:  Diagnosis Date  . Allergic rhinitis   . Anxiety   . Aortic insufficiency   . Breast cancer [facility]) 2008   Left Breast Cancer  . Contact dermatitis   . Diverticulosis of colon   . DJD (degenerative joint disease)   . Dysrhythmia    A-fib  . GERD (gastroesophageal reflux disease)   . Hx of colonic polyps   . Hyperlipidemia   . Macular degeneration, bilateral   . Osteopenia   . Paroxysmal atrial fibrillation (HCC)    A. fib is now persistent.  . Personal history of radiation therapy 2008   Left Breast Cancer  . Visit for monitoring Tikosyn therapy 09/06/2015   Past Surgical History:  Procedure Laterality Date  . ABDOMINAL HYSTERECTOMY  1984   with 1 ovary removal  . BREAST LUMPECTOMY Left 08/2007     with node bx  . ELECTROPHYSIOLOGIC STUDY N/A 05/07/2016   Procedure: Atrial Fibrillation Ablation;  Surgeon: Louvina Cleary Meredith Leeds, MD;  Location: [facility];  Service: Cardiovascular;  Laterality: N/A;     Current Outpatient Medications  Medication Sig Dispense Refill  . acetaminophen (TYLENOL) 325 MG tablet Take 500 mg by mouth every 6 (six) hours as needed for mild pain or fever.     . cholecalciferol (VITAMIN D) 1000 UNITS tablet Take 1,000 Units by mouth 2 (two) times daily.    . famotidine (PEPCID) 10 MG tablet Take 10 mg by mouth 2 (two) times daily.    Marland Kitchen loratadine (CLARITIN) 10 MG tablet Take 10 mg by mouth daily.    . Melatonin 3 MG TABS Take 3 mg by mouth at bedtime as needed (for sleep).     . Multiple Vitamins-Minerals (ICAPS) TABS Take 1 tablet by mouth 2 (two) times daily.     . rivaroxaban (XARELTO) 20 MG TABS tablet Take 1 tablet (20 mg total) by mouth daily. 90 tablet 3  . simvastatin (ZOCOR) 20 MG tablet TAKE 1 TABLET BY MOUTH AT BEDTIME 90 tablet 3   No current facility-administered medications for this visit.    Allergies:   Codeine   Social History:  The patient  reports that she quit smoking about 43 years ago. Her smoking use included cigarettes. She has never used smokeless tobacco. She reports that she does not drink alcohol and  does not use drugs.   Family History:  The patient's family history includes Breast cancer in her sister; Clotting disorder (age of onset: 96) in her mother; Healthy in her brother; Heart attack in her father; Heart disease in her mother; Heart disease (age of onset: 35) in her father; Hypertension in her sister; Lymphoma in her sister; Other in her maternal grandmother; Other (age of onset: 67) in her mother.   ROS:  Please see the history of present illness.   Otherwise, review of systems is positive for none.   All other systems are reviewed and negative.   PHYSICAL EXAM: VS:  BP 130/82   Pulse 60   Ht 5\' 3"  (1.6 m)   Wt  120 lb 3.2 oz (54.5 kg)   SpO2 98%   BMI 21.29 kg/m  , BMI Body mass index is 21.29 kg/m. GEN: Well nourished, well developed, in no acute distress  HEENT: normal  Neck: no JVD, carotid bruits, or masses Cardiac: RRR; no murmurs, rubs, or gallops,no edema  Respiratory:  clear to auscultation bilaterally, normal work of breathing GI: soft, nontender, nondistended, + BS MS: no deformity or atrophy  Skin: warm and dry Neuro:  Strength and sensation are intact Psych: euthymic mood, full affect  EKG:  EKG is ordered today. Personal review of the ekg ordered shows sinus rhythm, left bundle branch block  Recent Labs: 01/15/2020: B Natriuretic Peptide 287.9 01/18/2020: Magnesium 2.1 04/30/2020: ALT 12; BUN 9; Creatinine, Ser 0.66; Hemoglobin 12.6; Platelets 283.0; Potassium 4.8; Sodium 136; TSH 2.96    Lipid Panel     Component Value Date/Time   CHOL 165 07/11/2019 0817   TRIG 71 01/14/2020 0520   HDL 70.40 07/11/2019 0817   CHOLHDL 2 07/11/2019 0817   VLDL 17.6 07/11/2019 0817   LDLCALC 77 07/11/2019 0817     Wt Readings from Last 3 Encounters:  10/09/20 120 lb 3.2 oz (54.5 kg)  04/30/20 120 lb (54.4 kg)  03/18/20 122 lb (55.3 kg)      Other studies Reviewed: Additional studies/ records that were reviewed today include: 07/09/15 TTE Review of the above records today demonstrates: - Normal LV function; elevated LV filling pressure; mild AI; thickened MV with prolapse of the anterior leaflet; eccentric, posteriorly directed MR difficult to quantitate but appears to be mild (may be underestimated due to eccentric nature); mild TR, mildly elevated pulmonary pressures.    ASSESSMENT AND PLAN:  1.  Persistent atrial fibrillation: Currently on Xarelto.  CHA2DS2-VASc of 2.  Is status post atrial fibrillation ablation 05/07/2016.   She has not had any further episodes of atrial fibrillation.  Continue current management.   2.  Hyperlipidemia: Continue simvastatin per  primary care  Current medicines are reviewed at length with the patient today.   The patient does not have concerns regarding her medicines.  The following changes were made today: None  Labs/ tests ordered today include:  Orders Placed This Encounter  Procedures  . EKG 12-Lead     Disposition:   FU with Zurie Platas 12 months  Signed, Trypp Heckmann Meredith Leeds, MD  10/09/2020 11:23 AM     [facility] [address] [place] [id number] [phone] (office) [phone] (fax)

## 2020-10-13 DIAGNOSIS — L820 Inflamed seborrheic keratosis: Secondary | ICD-10-CM | POA: Diagnosis not present

## 2020-10-15 ENCOUNTER — Encounter (INDEPENDENT_AMBULATORY_CARE_PROVIDER_SITE_OTHER): Payer: PPO | Admitting: Ophthalmology

## 2020-10-15 ENCOUNTER — Other Ambulatory Visit: Payer: Self-pay

## 2020-10-15 DIAGNOSIS — H353111 Nonexudative age-related macular degeneration, right eye, early dry stage: Secondary | ICD-10-CM

## 2020-10-15 DIAGNOSIS — H35033 Hypertensive retinopathy, bilateral: Secondary | ICD-10-CM

## 2020-10-15 DIAGNOSIS — I10 Essential (primary) hypertension: Secondary | ICD-10-CM | POA: Diagnosis not present

## 2020-10-15 DIAGNOSIS — H353221 Exudative age-related macular degeneration, left eye, with active choroidal neovascularization: Secondary | ICD-10-CM

## 2020-10-15 DIAGNOSIS — H43813 Vitreous degeneration, bilateral: Secondary | ICD-10-CM | POA: Diagnosis not present

## 2020-11-04 ENCOUNTER — Encounter: Payer: Self-pay | Admitting: Internal Medicine

## 2020-11-04 ENCOUNTER — Other Ambulatory Visit: Payer: Self-pay

## 2020-11-04 ENCOUNTER — Ambulatory Visit (INDEPENDENT_AMBULATORY_CARE_PROVIDER_SITE_OTHER): Payer: PPO | Admitting: Internal Medicine

## 2020-11-04 ENCOUNTER — Ambulatory Visit (INDEPENDENT_AMBULATORY_CARE_PROVIDER_SITE_OTHER): Payer: PPO

## 2020-11-04 DIAGNOSIS — M8580 Other specified disorders of bone density and structure, unspecified site: Secondary | ICD-10-CM

## 2020-11-04 DIAGNOSIS — F411 Generalized anxiety disorder: Secondary | ICD-10-CM | POA: Diagnosis not present

## 2020-11-04 DIAGNOSIS — M79662 Pain in left lower leg: Secondary | ICD-10-CM | POA: Insufficient documentation

## 2020-11-04 DIAGNOSIS — I480 Paroxysmal atrial fibrillation: Secondary | ICD-10-CM

## 2020-11-04 DIAGNOSIS — M85852 Other specified disorders of bone density and structure, left thigh: Secondary | ICD-10-CM | POA: Diagnosis not present

## 2020-11-04 LAB — COMPREHENSIVE METABOLIC PANEL
ALT: 13 U/L (ref 0–35)
AST: 20 U/L (ref 0–37)
Albumin: 4.1 g/dL (ref 3.5–5.2)
Alkaline Phosphatase: 103 U/L (ref 39–117)
BUN: 12 mg/dL (ref 6–23)
CO2: 32 mEq/L (ref 19–32)
Calcium: 8.8 mg/dL (ref 8.4–10.5)
Chloride: 99 mEq/L (ref 96–112)
Creatinine, Ser: 0.61 mg/dL (ref 0.40–1.20)
GFR: 88.24 mL/min (ref >60.00)
Glucose, Bld: 95 mg/dL (ref 70–99)
Potassium: 4.2 mEq/L (ref 3.5–5.1)
Sodium: 137 mEq/L (ref 135–145)
Total Bilirubin: 0.5 mg/dL (ref 0.2–1.2)
Total Protein: 6.8 g/dL (ref 6.0–8.3)

## 2020-11-04 LAB — CBC WITH DIFFERENTIAL/PLATELET
Basophils Absolute: 0 10*3/uL (ref 0.0–0.1)
Basophils Relative: 0.5 % (ref 0.0–3.0)
Eosinophils Absolute: 0 10*3/uL (ref 0.0–0.7)
Eosinophils Relative: 0 % (ref 0.0–5.0)
HCT: 38 % (ref 36.0–46.0)
Hemoglobin: 12.7 g/dL (ref 12.0–15.0)
Lymphocytes Relative: 21.5 % (ref 12.0–46.0)
Lymphs Abs: 1.3 10*3/uL (ref 0.7–4.0)
MCHC: 33.6 g/dL (ref 30.0–36.0)
MCV: 93.2 fl (ref 78.0–100.0)
Monocytes Absolute: 0.8 10*3/uL (ref 0.1–1.0)
Monocytes Relative: 13.5 % — ABNORMAL HIGH (ref 3.0–12.0)
Neutro Abs: 3.8 10*3/uL (ref 1.4–7.7)
Neutrophils Relative %: 64.5 % (ref 43.0–77.0)
Platelets: 281 10*3/uL (ref 150.0–400.0)
RBC: 4.07 Mil/uL (ref 3.87–5.11)
RDW: 12.9 % (ref 11.5–15.5)
WBC: 5.9 10*3/uL (ref 4.0–10.5)

## 2020-11-04 LAB — TSH: TSH: 2.6 u[IU]/mL (ref 0.35–4.50)

## 2020-11-04 NOTE — Assessment & Plan Note (Signed)
distal ?etiol Voltaren gel X ray

## 2020-11-04 NOTE — Assessment & Plan Note (Signed)
Move out to a sleep in a separate bedroom husband is a Clinical research associate

## 2020-11-04 NOTE — Addendum Note (Signed)
Addended by: Boris Lown B on: 11/04/2020 08:30 AM   Modules accepted: Orders

## 2020-11-04 NOTE — Progress Notes (Signed)
Subjective:  Patient ID: Vicki [name], female    DOB: Apr 25, 1946  Age: 74 y.o. MRN: [id number]  CC: Annual Exam   HPI Vicki [name] presents for dyslipidemia, A fib  Husband snores  C/o L distal shin pain x 1 month 3/10 limps, worse w/walking; some swelling  Outpatient Medications Prior to Visit  Medication Sig Dispense Refill  . acetaminophen (TYLENOL) 325 MG tablet Take 500 mg by mouth every 6 (six) hours as needed for mild pain or fever.     . cholecalciferol (VITAMIN D) 1000 UNITS tablet Take 1,000 Units by mouth 2 (two) times daily.    . famotidine (PEPCID) 10 MG tablet Take 10 mg by mouth 2 (two) times daily.    Marland Kitchen FLUZONE HIGH-DOSE QUADRIVALENT 0.7 ML SUSY     . loratadine (CLARITIN) 10 MG tablet Take 10 mg by mouth daily.    . Melatonin 3 MG TABS Take 3 mg by mouth at bedtime as needed (for sleep).     . Multiple Vitamins-Minerals (ICAPS) TABS Take 1 tablet by mouth 2 (two) times daily.     . rivaroxaban (XARELTO) 20 MG TABS tablet Take 1 tablet (20 mg total) by mouth daily. 90 tablet 3  . simvastatin (ZOCOR) 20 MG tablet TAKE 1 TABLET BY MOUTH AT BEDTIME 90 tablet 3   No facility-administered medications prior to visit.    ROS: Review of Systems  Constitutional: Negative for activity change, appetite change, chills, fatigue and unexpected weight change.  HENT: Negative for congestion, mouth sores and sinus pressure.   Eyes: Negative for visual disturbance.  Respiratory: Negative for cough and chest tightness.   Gastrointestinal: Negative for abdominal pain and nausea.  Genitourinary: Negative for difficulty urinating, frequency and vaginal pain.  Musculoskeletal: Positive for arthralgias. Negative for back pain and gait problem.  Skin: Negative for pallor and rash.  Neurological: Negative for dizziness, tremors, weakness, numbness and headaches.  Psychiatric/Behavioral: Positive for decreased concentration. Negative for confusion and sleep disturbance.     Objective:  BP (!) 152/80 (BP Location: Right Arm, Patient Position: Sitting, Cuff Size: Normal)   Pulse 60   Temp 98.2 F (36.8 C) (Oral)   Ht 5\' 2"  (1.575 m)   Wt 117 lb (53.1 kg)   SpO2 97%   BMI 21.40 kg/m   BP Readings from Last 3 Encounters:  11/04/20 (!) 152/80  10/09/20 130/82  04/30/20 (!) 170/90    Wt Readings from Last 3 Encounters:  11/04/20 117 lb (53.1 kg)  10/09/20 120 lb 3.2 oz (54.5 kg)  04/30/20 120 lb (54.4 kg)    Physical Exam Constitutional:      General: She is not in acute distress.    Appearance: She is well-developed.  HENT:     Head: Normocephalic.     Right Ear: External ear normal.     Left Ear: External ear normal.     Nose: Nose normal.  Eyes:     General:        Right eye: No discharge.        Left eye: No discharge.     Conjunctiva/sclera: Conjunctivae normal.     Pupils: Pupils are equal, round, and reactive to light.  Neck:     Thyroid: No thyromegaly.     Vascular: No JVD.     Trachea: No tracheal deviation.  Cardiovascular:     Rate and Rhythm: Normal rate and regular rhythm.     Heart sounds: Normal heart sounds.  Pulmonary:  Effort: No respiratory distress.     Breath sounds: No stridor. No wheezing.  Abdominal:     General: Bowel sounds are normal. There is no distension.     Palpations: Abdomen is soft. There is no mass.     Tenderness: There is no abdominal tenderness. There is no guarding or rebound.  Musculoskeletal:        General: No tenderness.     Cervical back: Normal range of motion and neck supple.  Lymphadenopathy:     Cervical: No cervical adenopathy.  Skin:    Findings: No erythema or rash.  Neurological:     Cranial Nerves: No cranial nerve deficit.     Motor: No abnormal muscle tone.     Coordination: Coordination normal.     Deep Tendon Reflexes: Reflexes normal.  Psychiatric:        Behavior: Behavior normal.        Thought Content: Thought content normal.        Judgment: Judgment  normal.     distal shin is painful - L   Lab Results  Component Value Date   WBC 6.1 04/30/2020   HGB 12.6 04/30/2020   HCT 37.4 04/30/2020   PLT 283.0 04/30/2020   GLUCOSE 102 (H) 04/30/2020   CHOL 165 07/11/2019   TRIG 71 01/14/2020   HDL 70.40 07/11/2019   LDLCALC 77 07/11/2019   ALT 12 04/30/2020   AST 18 04/30/2020   NA 136 04/30/2020   K 4.8 04/30/2020   CL 100 04/30/2020   CREATININE 0.66 04/30/2020   BUN 9 04/30/2020   CO2 31 04/30/2020   TSH 2.96 04/30/2020   INR 1.41 08/07/2015    MM 3D SCREEN BREAST BILATERAL  Result Date: 02/14/2020 CLINICAL DATA:  Screening. EXAM: DIGITAL SCREENING BILATERAL MAMMOGRAM WITH TOMO AND CAD COMPARISON:  Previous exam(s). ACR Breast Density Category b: There are scattered areas of fibroglandular density. FINDINGS: There are no findings suspicious for malignancy. Images were processed with CAD. IMPRESSION: No mammographic evidence of malignancy. A result letter of this screening mammogram will be mailed directly to the patient. RECOMMENDATION: Screening mammogram in one year. (Code:SM-B-01Y) BI-RADS CATEGORY  1: Negative. Electronically Signed   By: Curlene Dolphin M.D.   On: 02/14/2020 16:34    Assessment & Plan:    Walker Kehr, MD

## 2020-11-04 NOTE — Assessment & Plan Note (Signed)
Pt refused other meds

## 2020-11-04 NOTE — Assessment & Plan Note (Signed)
Xarelto

## 2021-01-12 ENCOUNTER — Other Ambulatory Visit: Payer: Self-pay | Admitting: Internal Medicine

## 2021-01-12 DIAGNOSIS — Z1231 Encounter for screening mammogram for malignant neoplasm of breast: Secondary | ICD-10-CM

## 2021-01-13 ENCOUNTER — Other Ambulatory Visit: Payer: Self-pay

## 2021-01-13 ENCOUNTER — Encounter (INDEPENDENT_AMBULATORY_CARE_PROVIDER_SITE_OTHER): Payer: PPO | Admitting: Ophthalmology

## 2021-01-13 DIAGNOSIS — I10 Essential (primary) hypertension: Secondary | ICD-10-CM

## 2021-01-13 DIAGNOSIS — H353221 Exudative age-related macular degeneration, left eye, with active choroidal neovascularization: Secondary | ICD-10-CM | POA: Diagnosis not present

## 2021-01-13 DIAGNOSIS — H353111 Nonexudative age-related macular degeneration, right eye, early dry stage: Secondary | ICD-10-CM | POA: Diagnosis not present

## 2021-01-13 DIAGNOSIS — H35033 Hypertensive retinopathy, bilateral: Secondary | ICD-10-CM

## 2021-01-13 DIAGNOSIS — H43813 Vitreous degeneration, bilateral: Secondary | ICD-10-CM

## 2021-02-22 IMAGING — DX DG TIBIA/FIBULA 2V*L*
4 series · 4 of 4 positions shown · non-contrast
Comparison: No prior.

CLINICAL DATA: Distal shin pain.

EXAM:
LEFT TIBIA AND FIBULA - 2 VIEW

[tibia ap (1 of 2)]
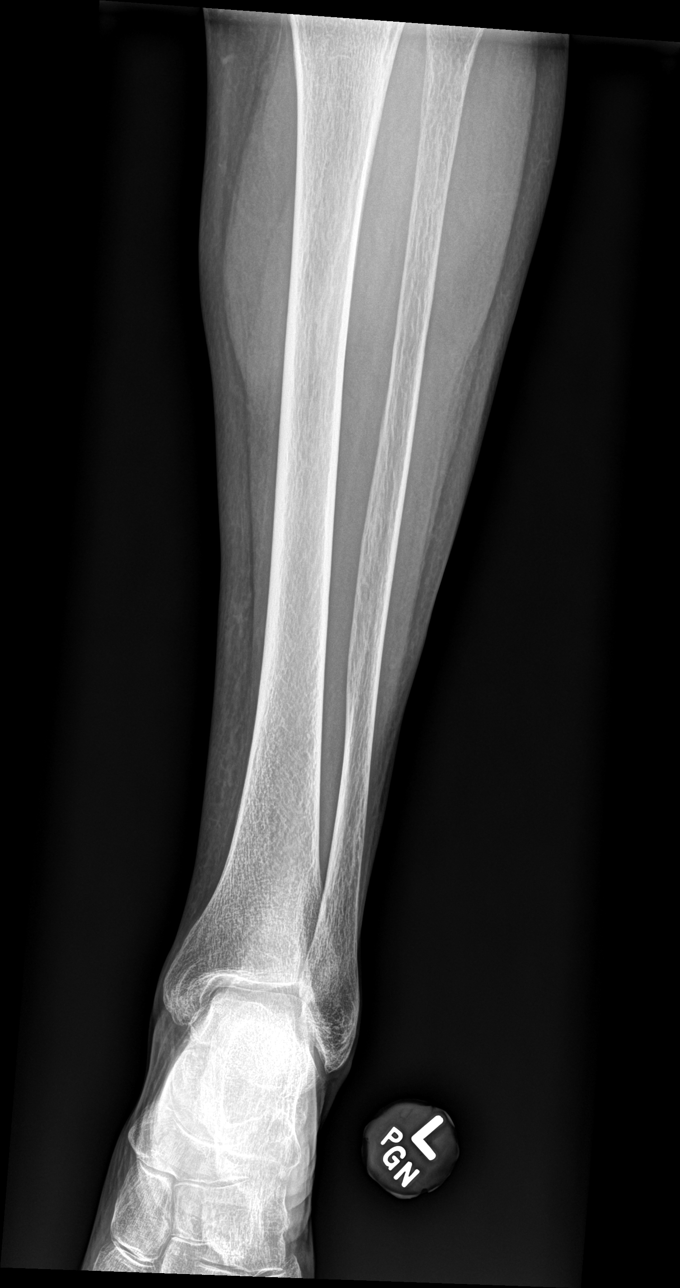

[tibia ap (2 of 2)]
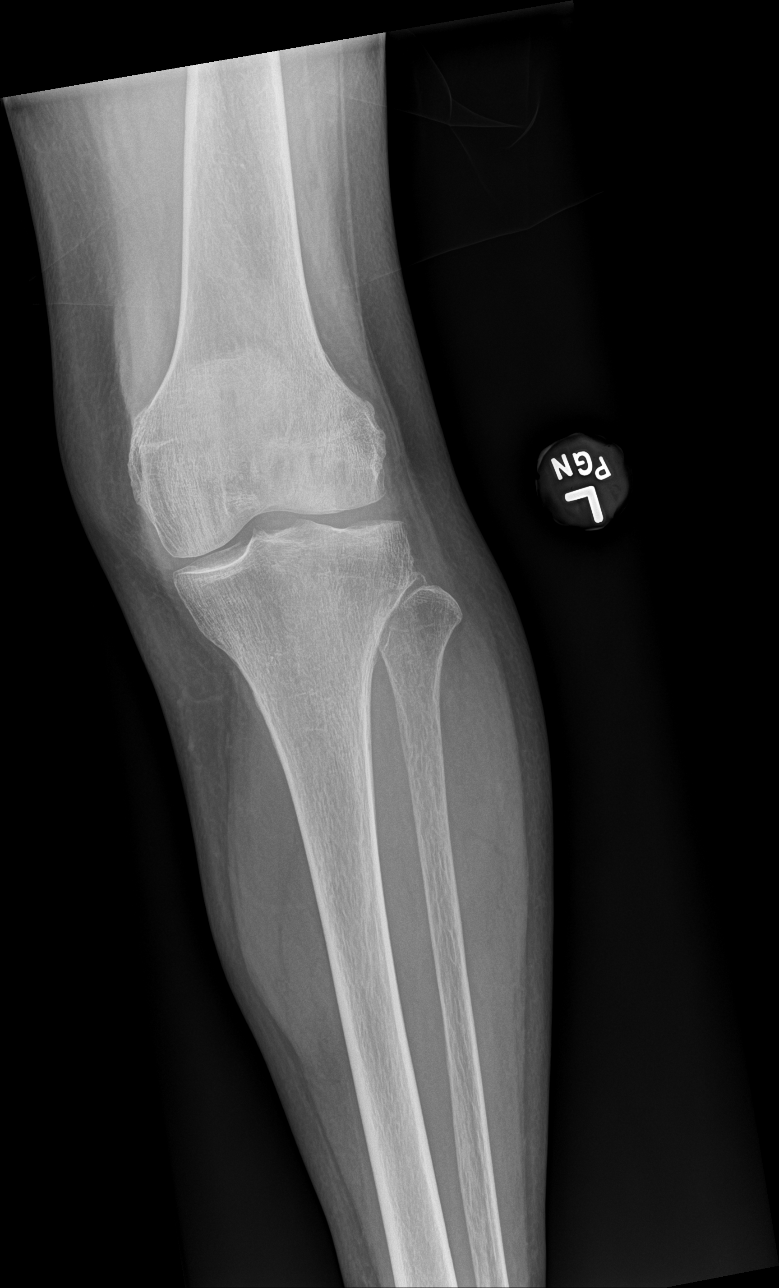

[tibia lat (1 of 2)]
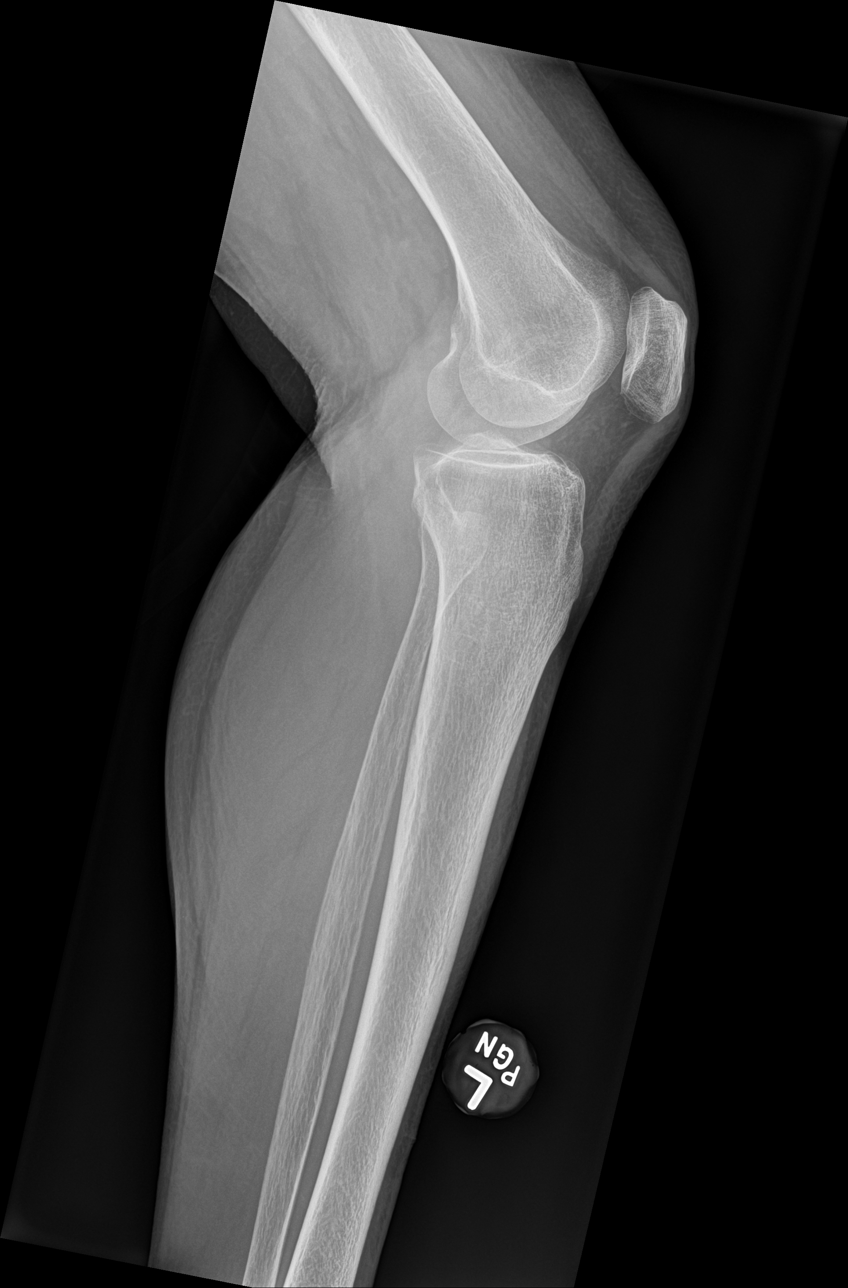

[tibia lat (2 of 2)]
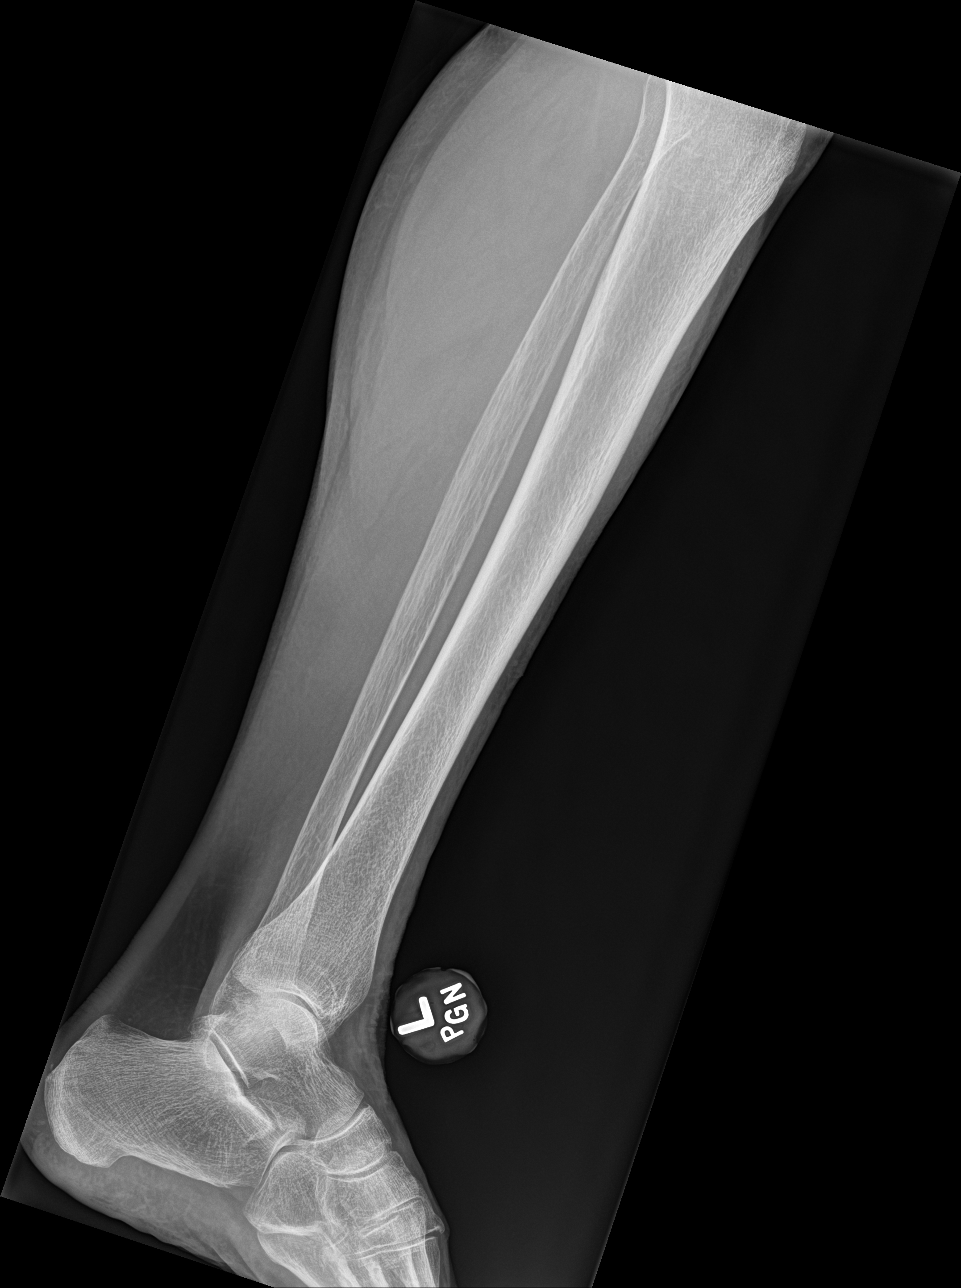

[4 of 4 positions shown; findings below may reference images not displayed]

FINDINGS: Diffuse osteopenia. Mild degenerative change left ankle and knee. No
acute bony abnormality. No evidence of fracture or dislocation.
IMPRESSION: Diffuse osteopenia. Mild degenerative change left ankle and knee. No
acute abnormality.

## 2021-03-02 ENCOUNTER — Other Ambulatory Visit: Payer: Self-pay

## 2021-03-02 ENCOUNTER — Ambulatory Visit
Admission: RE | Admit: 2021-03-02 | Discharge: 2021-03-02 | Disposition: A | Discharge status: Home / Self Care | Payer: PPO | Source: Ambulatory Visit | Attending: Internal Medicine | Admitting: Internal Medicine

## 2021-03-02 ENCOUNTER — Ambulatory Visit: Payer: PPO

## 2021-03-02 DIAGNOSIS — Z1231 Encounter for screening mammogram for malignant neoplasm of breast: Secondary | ICD-10-CM

## 2021-04-21 ENCOUNTER — Other Ambulatory Visit: Payer: Self-pay

## 2021-04-21 ENCOUNTER — Encounter (INDEPENDENT_AMBULATORY_CARE_PROVIDER_SITE_OTHER): Payer: PPO | Admitting: Ophthalmology

## 2021-04-21 DIAGNOSIS — H353221 Exudative age-related macular degeneration, left eye, with active choroidal neovascularization: Secondary | ICD-10-CM

## 2021-04-21 DIAGNOSIS — I10 Essential (primary) hypertension: Secondary | ICD-10-CM | POA: Diagnosis not present

## 2021-04-21 DIAGNOSIS — H43813 Vitreous degeneration, bilateral: Secondary | ICD-10-CM | POA: Diagnosis not present

## 2021-04-21 DIAGNOSIS — H35033 Hypertensive retinopathy, bilateral: Secondary | ICD-10-CM

## 2021-04-21 DIAGNOSIS — H353111 Nonexudative age-related macular degeneration, right eye, early dry stage: Secondary | ICD-10-CM | POA: Diagnosis not present

## 2021-05-01 ENCOUNTER — Encounter: Payer: Self-pay | Admitting: Internal Medicine

## 2021-05-05 ENCOUNTER — Other Ambulatory Visit: Payer: Self-pay

## 2021-05-05 ENCOUNTER — Encounter: Payer: Self-pay | Admitting: Internal Medicine

## 2021-05-05 ENCOUNTER — Ambulatory Visit (INDEPENDENT_AMBULATORY_CARE_PROVIDER_SITE_OTHER): Payer: PPO | Admitting: Internal Medicine

## 2021-05-05 VITALS — BP 132/80 | HR 64 | Temp 98.2°F | Ht 62.0 in | Wt 116.0 lb

## 2021-05-05 DIAGNOSIS — I480 Paroxysmal atrial fibrillation: Secondary | ICD-10-CM

## 2021-05-05 DIAGNOSIS — E785 Hyperlipidemia, unspecified: Secondary | ICD-10-CM

## 2021-05-05 DIAGNOSIS — Z23 Encounter for immunization: Secondary | ICD-10-CM

## 2021-05-05 DIAGNOSIS — F411 Generalized anxiety disorder: Secondary | ICD-10-CM | POA: Diagnosis not present

## 2021-05-05 LAB — COMPREHENSIVE METABOLIC PANEL
ALT: 12 U/L (ref 0–35)
AST: 16 U/L (ref 0–37)
Albumin: 4.2 g/dL (ref 3.5–5.2)
Alkaline Phosphatase: 100 U/L (ref 39–117)
BUN: 11 mg/dL (ref 6–23)
CO2: 30 mEq/L (ref 19–32)
Calcium: 9.3 mg/dL (ref 8.4–10.5)
Chloride: 100 mEq/L (ref 96–112)
Creatinine, Ser: 0.57 mg/dL (ref 0.40–1.20)
GFR: 89.38 mL/min (ref >60.00)
Glucose, Bld: 95 mg/dL (ref 70–99)
Potassium: 4.6 mEq/L (ref 3.5–5.1)
Sodium: 137 mEq/L (ref 135–145)
Total Bilirubin: 0.7 mg/dL (ref 0.2–1.2)
Total Protein: 6.8 g/dL (ref 6.0–8.3)

## 2021-05-05 LAB — LIPID PANEL
Cholesterol: 162 mg/dL (ref 0–200)
HDL: 68.3 mg/dL (ref >39.00)
LDL Cholesterol: 73 mg/dL (ref 0–99)
NonHDL: 93.31
Total CHOL/HDL Ratio: 2
Triglycerides: 103 mg/dL (ref 0.0–149.0)
VLDL: 20.6 mg/dL (ref 0.0–40.0)

## 2021-05-05 NOTE — Assessment & Plan Note (Signed)
Cont w/Xarelto °

## 2021-05-05 NOTE — Addendum Note (Signed)
Addended by: Jacobo Forest on: 05/05/2021 08:29 AM   Modules accepted: Orders

## 2021-05-05 NOTE — Addendum Note (Signed)
Addended by: Earnstine Regal on: 05/05/2021 08:43 AM   Modules accepted: Orders

## 2021-05-05 NOTE — Assessment & Plan Note (Signed)
Cont w/simvastatin

## 2021-05-05 NOTE — Assessment & Plan Note (Signed)
Stable

## 2021-05-05 NOTE — Progress Notes (Signed)
Subjective:  Patient ID: Vicki [name], female    DOB: 1946-10-24  Age: 75 y.o. MRN: [id number]  CC: No chief complaint on file.   HPI Vicki [name] presents for A fib, anticoagulation, allergies f/u  Outpatient Medications Prior to Visit  Medication Sig Dispense Refill  . acetaminophen (TYLENOL) 325 MG tablet Take 500 mg by mouth every 6 (six) hours as needed for mild pain or fever.     . cholecalciferol (VITAMIN D) 1000 UNITS tablet Take 1,000 Units by mouth 2 (two) times daily.    . famotidine (PEPCID) 10 MG tablet Take 10 mg by mouth 2 (two) times daily.    Marland Kitchen FLUZONE HIGH-DOSE QUADRIVALENT 0.7 ML SUSY     . loratadine (CLARITIN) 10 MG tablet Take 10 mg by mouth daily.    . Melatonin 3 MG TABS Take 3 mg by mouth at bedtime as needed (for sleep).     . Multiple Vitamins-Minerals (ICAPS) TABS Take 1 tablet by mouth 2 (two) times daily.     . rivaroxaban (XARELTO) 20 MG TABS tablet Take 1 tablet (20 mg total) by mouth daily. 90 tablet 3  . simvastatin (ZOCOR) 20 MG tablet TAKE 1 TABLET BY MOUTH AT BEDTIME 90 tablet 3   No facility-administered medications prior to visit.    ROS: Review of Systems  Constitutional: Negative for activity change, appetite change, chills, fatigue and unexpected weight change.  HENT: Negative for congestion, mouth sores and sinus pressure.   Eyes: Negative for visual disturbance.  Respiratory: Negative for cough and chest tightness.   Gastrointestinal: Negative for abdominal pain and nausea.  Genitourinary: Negative for difficulty urinating, frequency and vaginal pain.  Musculoskeletal: Negative for back pain and gait problem.  Skin: Negative for pallor and rash.  Neurological: Negative for dizziness, tremors, weakness, numbness and headaches.  Psychiatric/Behavioral: Negative for confusion, sleep disturbance and suicidal ideas.    Objective:  There were no vitals taken for this visit.  BP Readings from Last 3 Encounters:  11/04/20 (!)  152/80  10/09/20 130/82  04/30/20 (!) 170/90    Wt Readings from Last 3 Encounters:  11/04/20 117 lb (53.1 kg)  10/09/20 120 lb 3.2 oz (54.5 kg)  04/30/20 120 lb (54.4 kg)    Physical Exam Constitutional:      General: She is not in acute distress.    Appearance: She is well-developed.  HENT:     Head: Normocephalic.     Right Ear: External ear normal.     Left Ear: External ear normal.     Nose: Nose normal.  Eyes:     General:        Right eye: No discharge.        Left eye: No discharge.     Conjunctiva/sclera: Conjunctivae normal.     Pupils: Pupils are equal, round, and reactive to light.  Neck:     Thyroid: No thyromegaly.     Vascular: No JVD.     Trachea: No tracheal deviation.  Cardiovascular:     Rate and Rhythm: Normal rate and regular rhythm.     Heart sounds: Normal heart sounds.  Pulmonary:     Effort: No respiratory distress.     Breath sounds: No stridor. No wheezing.  Abdominal:     General: Bowel sounds are normal. There is no distension.     Palpations: Abdomen is soft. There is no mass.     Tenderness: There is no abdominal tenderness. There is no guarding  or rebound.  Musculoskeletal:        General: Tenderness present.     Cervical back: Normal range of motion and neck supple.  Lymphadenopathy:     Cervical: No cervical adenopathy.  Skin:    Findings: No erythema or rash.  Neurological:     Cranial Nerves: No cranial nerve deficit.     Motor: No abnormal muscle tone.     Coordination: Coordination normal.     Deep Tendon Reflexes: Reflexes normal.  Psychiatric:        Behavior: Behavior normal.        Thought Content: Thought content normal.        Judgment: Judgment normal.     Lab Results  Component Value Date   WBC 5.9 11/04/2020   HGB 12.7 11/04/2020   HCT 38.0 11/04/2020   PLT 281.0 11/04/2020   GLUCOSE 95 11/04/2020   CHOL 165 07/11/2019   TRIG 71 01/14/2020   HDL 70.40 07/11/2019   LDLCALC 77 07/11/2019   ALT 13  11/04/2020   AST 20 11/04/2020   NA 137 11/04/2020   K 4.2 11/04/2020   CL 99 11/04/2020   CREATININE 0.61 11/04/2020   BUN 12 11/04/2020   CO2 32 11/04/2020   TSH 2.60 11/04/2020   INR 1.41 08/07/2015    MM 3D SCREEN BREAST BILATERAL  Result Date: 03/04/2021 CLINICAL DATA:  Screening. EXAM: DIGITAL SCREENING BILATERAL MAMMOGRAM WITH TOMOSYNTHESIS AND CAD TECHNIQUE: Bilateral screening digital craniocaudal and mediolateral oblique mammograms were obtained. Bilateral screening digital breast tomosynthesis was performed. The images were evaluated with computer-aided detection. COMPARISON:  Previous exam(s). ACR Breast Density Category b: There are scattered areas of fibroglandular density. FINDINGS: There are no findings suspicious for malignancy. The images were evaluated with computer-aided detection. IMPRESSION: No mammographic evidence of malignancy. A result letter of this screening mammogram will be mailed directly to the patient. RECOMMENDATION: Screening mammogram in one year. (Code:SM-B-01Y) BI-RADS CATEGORY  1: Negative. Electronically Signed   By: Ammie Ferrier M.D.   On: 03/04/2021 08:01    Assessment & Plan:   Walker Kehr, MD

## 2021-05-06 ENCOUNTER — Other Ambulatory Visit: Payer: Self-pay | Admitting: Internal Medicine

## 2021-07-28 ENCOUNTER — Encounter (INDEPENDENT_AMBULATORY_CARE_PROVIDER_SITE_OTHER): Payer: PPO | Admitting: Ophthalmology

## 2021-07-28 ENCOUNTER — Other Ambulatory Visit: Payer: Self-pay

## 2021-07-28 DIAGNOSIS — H35033 Hypertensive retinopathy, bilateral: Secondary | ICD-10-CM | POA: Diagnosis not present

## 2021-07-28 DIAGNOSIS — I10 Essential (primary) hypertension: Secondary | ICD-10-CM | POA: Diagnosis not present

## 2021-07-28 DIAGNOSIS — H353112 Nonexudative age-related macular degeneration, right eye, intermediate dry stage: Secondary | ICD-10-CM | POA: Diagnosis not present

## 2021-07-28 DIAGNOSIS — H43813 Vitreous degeneration, bilateral: Secondary | ICD-10-CM

## 2021-07-28 DIAGNOSIS — H353221 Exudative age-related macular degeneration, left eye, with active choroidal neovascularization: Secondary | ICD-10-CM

## 2021-09-01 ENCOUNTER — Other Ambulatory Visit: Payer: Self-pay | Admitting: Internal Medicine

## 2021-10-06 ENCOUNTER — Other Ambulatory Visit: Payer: Self-pay

## 2021-10-06 ENCOUNTER — Encounter: Payer: Self-pay | Admitting: Cardiology

## 2021-10-06 ENCOUNTER — Ambulatory Visit: Payer: PPO | Admitting: Cardiology

## 2021-10-06 VITALS — BP 120/84 | HR 60 | Ht 63.0 in | Wt 119.0 lb

## 2021-10-06 DIAGNOSIS — I4819 Other persistent atrial fibrillation: Secondary | ICD-10-CM | POA: Diagnosis not present

## 2021-10-06 NOTE — Progress Notes (Signed)
Electrophysiology Office Note   Date:  10/06/2021   ID:  Vicki [name], DOB [birth year], MRN [id number]  PCP:  Cassandria Anger, MD  Cardiologist:  Quay Burow Primary Electrophysiologist: Constance Haw, MD    No chief complaint on file.    History of Present Illness: Vicki [name] is a 75 y.o. female who presents today for electrophysiology evaluation.     She has a history significant for persistent atrial fibrillation, hyperlipidemia, and aortic insufficiency.  She is currently on Xarelto.  She has a CHA2DS2-VASc of 3.  She is status post atrial fibrillation ablation 05/07/2016.  Today, denies symptoms of palpitations, chest pain, shortness of breath, orthopnea, PND, lower extremity edema, claudication, dizziness, presyncope, syncope, bleeding, or neurologic sequela. The patient is tolerating medications without difficulties.  She is currently feeling well.  She has no chest pain or shortness of breath.  She Vicki Solecki do all her daily activities.  She has noted no further episodes of atrial fibrillation.  She has not had atrial fibrillation since her ablation.   Past Medical History:  Diagnosis Date   Allergic rhinitis    Anxiety    Aortic insufficiency    Breast cancer ([place]) 2008   Left Breast Cancer   Contact dermatitis    Diverticulosis of colon    DJD (degenerative joint disease)    Dysrhythmia    A-fib   GERD (gastroesophageal reflux disease)    Hx of colonic polyps    Hyperlipidemia    Macular degeneration, bilateral    Osteopenia    Paroxysmal atrial fibrillation ([facility])    A. fib is now persistent.   Personal history of radiation therapy 2008   Left Breast Cancer   Visit for monitoring Tikosyn therapy 09/06/2015   Past Surgical History:  Procedure Laterality Date   ABDOMINAL HYSTERECTOMY  1984   with 1 ovary removal   BREAST LUMPECTOMY Left 08/2007    with node bx   ELECTROPHYSIOLOGIC STUDY N/A 05/07/2016   Procedure: Atrial Fibrillation  Ablation;  Surgeon: Tuana Hoheisel Meredith Leeds, MD;  Location: [facility];  Service: Cardiovascular;  Laterality: N/A;     Current Outpatient Medications  Medication Sig Dispense Refill   acetaminophen (TYLENOL) 325 MG tablet Take 500 mg by mouth every 6 (six) hours as needed for mild pain or fever.      cholecalciferol (VITAMIN D) 1000 UNITS tablet Take 1,000 Units by mouth 2 (two) times daily.     famotidine (PEPCID) 10 MG tablet Take 10 mg by mouth daily.     FLUZONE HIGH-DOSE QUADRIVALENT 0.7 ML Vicki      loratadine (CLARITIN) 10 MG tablet Take 10 mg by mouth daily.     Melatonin 3 MG TABS Take 3 mg by mouth at bedtime as needed (for sleep).      Multiple Vitamins-Minerals (ICAPS) TABS Take 1 tablet by mouth 2 (two) times daily.      simvastatin (ZOCOR) 20 MG tablet TAKE 1 TABLET BY MOUTH AT BEDTIME 90 tablet 1   No current facility-administered medications for this visit.    Allergies:   Codeine   Social History:  The patient  reports that she quit smoking about 44 years ago. Her smoking use included cigarettes. She has never used smokeless tobacco. She reports that she does not drink alcohol and does not use drugs.   Family History:  The patient's family history includes Breast cancer in her sister; Clotting disorder (age of onset: 18) in her mother;  Healthy in her brother; Heart attack in her father; Heart disease in her mother; Heart disease (age of onset: 46) in her father; Hypertension in her sister; Lymphoma in her sister; Other in her maternal grandmother; Other (age of onset: 67) in her mother.   ROS:  Please see the history of present illness.   Otherwise, review of systems is positive for none.   All other systems are reviewed and negative.   PHYSICAL EXAM: VS:  BP 120/84   Pulse 60   Ht 5\' 3"  (1.6 m)   Wt 119 lb (54 kg)   SpO2 98%   BMI 21.08 kg/m  , BMI Body mass index is 21.08 kg/m. GEN: Well nourished, well developed, in no acute distress  HEENT: normal  Neck:  no JVD, carotid bruits, or masses Cardiac: RRR; no murmurs, rubs, or gallops,no edema  Respiratory:  clear to auscultation bilaterally, normal work of breathing GI: soft, nontender, nondistended, + BS MS: no deformity or atrophy  Skin: warm and dry Neuro:  Strength and sensation are intact Psych: euthymic mood, full affect  EKG:  EKG is ordered today. Personal review of the ekg ordered shows sinus rhythm  Recent Labs: 11/04/2020: Hemoglobin 12.7; Platelets 281.0; TSH 2.60 05/05/2021: ALT 12; BUN 11; Creatinine, Ser 0.57; Potassium 4.6; Sodium 137    Lipid Panel     Component Value Date/Time   CHOL 162 05/05/2021 0829   TRIG 103.0 05/05/2021 0829   HDL 68.30 05/05/2021 0829   CHOLHDL 2 05/05/2021 0829   VLDL 20.6 05/05/2021 0829   LDLCALC 73 05/05/2021 0829     Wt Readings from Last 3 Encounters:  10/06/21 119 lb (54 kg)  05/05/21 116 lb (52.6 kg)  11/04/20 117 lb (53.1 kg)      Other studies Reviewed: Additional studies/ records that were reviewed today include: 07/09/15 TTE Review of the above records today demonstrates: - Normal LV function; elevated LV filling pressure; mild AI;   thickened MV with prolapse of the anterior leaflet; eccentric,   posteriorly directed MR difficult to quantitate but appears to be   mild (may be underestimated due to eccentric nature); mild TR,   mildly elevated pulmonary pressures.    ASSESSMENT AND PLAN:  1.  Persistent atrial fibrillation: Currently on Xarelto.  CHA2DS2-VASc of 3.  Status post atrial fibrillation ablation 05/07/2016.  Has not had further episodes of atrial fibrillation.  As it has been 5 years since her ablation, Vicki [name] stop Xarelto today. 2.  Hyperlipidemia: Continue simvastatin per primary care  Current medicines are reviewed at length with the patient today.   The patient does not have concerns regarding her medicines.  The following changes were made today: Stop Xarelto  Labs/ tests ordered today include:  Orders  Placed This Encounter  Procedures   EKG 12-Lead      Disposition:   FU with Vicki [name] 12 months  Signed, Vicki [name] Meredith Leeds, MD  10/06/2021 11:10 AM     [facility] [address] [place] [address] [phone] (office) [phone] (fax)

## 2021-10-12 ENCOUNTER — Ambulatory Visit (INDEPENDENT_AMBULATORY_CARE_PROVIDER_SITE_OTHER): Payer: PPO

## 2021-10-12 DIAGNOSIS — Z0000 Encounter for general adult medical examination without abnormal findings: Secondary | ICD-10-CM

## 2021-10-12 NOTE — Progress Notes (Addendum)
I connected with Vicki [name] today by telephone and verified that I am speaking with the correct person using two identifiers. Location patient: home Location provider: work Persons participating in the virtual visit: patient, provider.   I discussed the limitations, risks, security and privacy concerns of performing an evaluation and management service by telephone and the availability of in person appointments. I also discussed with the patient that there may be a patient responsible charge related to this service. The patient expressed understanding and verbally consented to this telephonic visit.    Interactive audio and video telecommunications were attempted between this provider and patient, however failed, due to patient having technical difficulties OR patient did not have access to video capability.  We continued and completed visit with audio only.  Some vital signs may be absent or patient reported.   Time Spent with patient on telephone encounter: 40 minutes  Subjective:   Vicki [name] is a 75 y.o. female who presents for Medicare Annual (Subsequent) preventive examination.  Review of Systems     Cardiac Risk Factors include: advanced age (>41men, >58 women);dyslipidemia;family history of premature cardiovascular disease;hypertension     Objective:    There were no vitals filed for this visit. There is no height or weight on file to calculate BMI.  Advanced Directives 10/12/2021 06/27/2020 01/14/2020 01/14/2020 01/11/2020 01/17/2019 01/16/2018  Does Patient Have a Medical Advance Directive? No Yes No No No No No  Type of Advance Directive - - - - - - -  Does patient want to make changes to medical advance directive? - No - Patient declined - - - - Yes (ED - Information included in AVS)  Copy of [place] in Chart? - - - - - - -  Would patient like information on creating a medical advance directive? No - Patient declined - No - Patient declined - - No -  Patient declined -    Current Medications (verified) Outpatient Encounter Medications as of 10/12/2021  Medication Sig   acetaminophen (TYLENOL) 325 MG tablet Take 500 mg by mouth every 6 (six) hours as needed for mild pain or fever.    cholecalciferol (VITAMIN D) 1000 UNITS tablet Take 1,000 Units by mouth 2 (two) times daily.   famotidine (PEPCID) 10 MG tablet Take 10 mg by mouth daily.   FLUZONE HIGH-DOSE QUADRIVALENT 0.7 ML SUSY    loratadine (CLARITIN) 10 MG tablet Take 10 mg by mouth daily.   Melatonin 3 MG TABS Take 3 mg by mouth at bedtime as needed (for sleep).    Multiple Vitamins-Minerals (ICAPS) TABS Take 1 tablet by mouth 2 (two) times daily.    simvastatin (ZOCOR) 20 MG tablet TAKE 1 TABLET BY MOUTH AT BEDTIME   No facility-administered encounter medications on file as of 10/12/2021.    Allergies (verified) Codeine   History: Past Medical History:  Diagnosis Date   Allergic rhinitis    Anxiety    Aortic insufficiency    Breast cancer ([place]) 2008   Left Breast Cancer   Contact dermatitis    Diverticulosis of colon    DJD (degenerative joint disease)    Dysrhythmia    A-fib   GERD (gastroesophageal reflux disease)    Hx of colonic polyps    Hyperlipidemia    Macular degeneration, bilateral    Osteopenia    Paroxysmal atrial fibrillation (HCC)    A. fib is now persistent.   Personal history of radiation therapy 2008   Left Breast Cancer  Visit for monitoring Tikosyn therapy 09/06/2015   Past Surgical History:  Procedure Laterality Date   ABDOMINAL HYSTERECTOMY  1984   with 1 ovary removal   BREAST LUMPECTOMY Left 08/2007    with node bx   ELECTROPHYSIOLOGIC STUDY N/A 05/07/2016   Procedure: Atrial Fibrillation Ablation;  Surgeon: Will Meredith Leeds, MD;  Location: [facility];  Service: Cardiovascular;  Laterality: N/A;   Family History  Problem Relation Age of Onset   Heart disease Father 63   Heart attack Father    Other Mother 39       AVR    Heart disease Mother    Clotting disorder Mother 89   Breast cancer Sister    Lymphoma Sister    Hypertension Sister    Healthy Brother    Other Maternal Grandmother    Colon cancer Neg Hx    Esophageal cancer Neg Hx    Pancreatic cancer Neg Hx    Social History   Socioeconomic History   Marital status: Married    Spouse name: Eilene Voigt   Number of children: 3   Years of education: Not on file   Highest education level: Not on file  Occupational History   Occupation: ACCOUNTING    Employer: [employer]  Tobacco Use   Smoking status: Former    Types: Cigarettes    Quit date: 12/06/1976    Years since quitting: 44.8   Smokeless tobacco: Never   Tobacco comments:    smoked approx 10 years  Vaping Use   Vaping Use: Never used  Substance and Sexual Activity   Alcohol use: No    Alcohol/week: 0.0 standard drinks   Drug use: No   Sexual activity: Not Currently  Other Topics Concern   Not on file  Social History Narrative   Not on file   Social Determinants of Health   Financial Resource Strain: Low Risk    Difficulty of Paying Living Expenses: Not hard at all  Food Insecurity: No Food Insecurity   Worried About Charity fundraiser in the Last Year: Never true   Ran Out of Food in the Last Year: Never true  Transportation Needs: No Transportation Needs   Lack of Transportation (Medical): No   Lack of Transportation (Non-Medical): No  Physical Activity: Sufficiently Active   Days of Exercise per Week: 5 days   Minutes of Exercise per Session: 40 min  Stress: No Stress Concern Present   Feeling of Stress : Not at all  Social Connections: Socially Integrated   Frequency of Communication with Friends and Family: More than three times a week   Frequency of Social Gatherings with Friends and Family: More than three times a week   Attends Religious Services: More than 4 times per year   Active Member of [employer] or Organizations: Yes   Attends Arts administrator: More than 4 times per year   Marital Status: Married    Tobacco Counseling Counseling given: Not Answered Tobacco comments: smoked approx 10 years   Clinical Intake:  Pre-visit preparation completed: Yes  Pain : No/denies pain     Nutritional Risks: None Diabetes: No  How often do you need to have someone help you when you read instructions, pamphlets, or other written materials from your doctor or pharmacy?: 1 - Never What is the last grade level you completed in school?: HSG  Diabetic?no  Interpreter Needed?: No  Information entered by :: Lisette Abu, LPN   Activities  of Daily Living In your present state of health, do you have any difficulty performing the following activities: 10/12/2021  Hearing? N  Vision? N  Difficulty concentrating or making decisions? N  Walking or climbing stairs? N  Dressing or bathing? N  Doing errands, shopping? N  Preparing Food and eating ? N  Using the Toilet? N  In the past six months, have you accidently leaked urine? N  Do you have problems with loss of bowel control? N  Managing your Medications? N  Managing your Finances? N  Housekeeping or managing your Housekeeping? N  Some recent data might be hidden    Patient Care Team: Plotnikov, Evie Lacks, MD as PCP - General (Internal Medicine) Constance Haw, MD as PCP - Electrophysiology (Cardiology) Lorretta Harp, MD as Consulting Physician (Cardiology) Monna Fam, MD as Consulting Physician (Ophthalmology) Hayden Pedro, MD as Consulting Physician (Ophthalmology)  Indicate any recent Medical Services you may have received from other than [facility] providers in the past year (date may be approximate).     Assessment:   This is a routine wellness examination for [place].  Hearing/Vision screen Hearing Screening - Comments:: Patient denied any hearing difficulty.  No hearing aids needed. Vision Screening - Comments:: Eye exam done by: Dr. Tempie Hoist and Dr. Monna Fam.  Dietary issues and exercise activities discussed: Current Exercise Habits: Home exercise routine, Type of exercise: walking, Time (Minutes): 30, Frequency (Times/Week): 5, Weekly Exercise (Minutes/Week): 150, Intensity: Moderate, Exercise limited by: None identified   Goals Addressed               This Visit's Progress     Patient Stated (pt-stated)        My goal is to stay healthy, independent and active.      Depression Screen PHQ 2/9 Scores 10/12/2021 11/04/2020 06/27/2020 02/05/2020 01/17/2019 01/16/2018 07/08/2017  PHQ - 2 Score 0 0 0 0 0 0 0  PHQ- 9 Score - - - - - 0 -    Fall Risk Fall Risk  10/12/2021 11/04/2020 06/27/2020 02/05/2020 01/17/2019  Falls in the past year? 0 0 0 0 0  Number falls in past yr: 0 0 0 - -  Injury with Fall? 0 0 0 - -  Risk for fall due to : No Fall Risks No Fall Risks No Fall Risks - Impaired balance/gait;Impaired mobility  Follow up Falls evaluation completed Falls evaluation completed Falls evaluation completed Falls evaluation completed -    FALL RISK PREVENTION PERTAINING TO THE HOME:  Any stairs in or around the home? No  If so, are there any without handrails? No  Home free of loose throw rugs in walkways, pet beds, electrical cords, etc? Yes  Adequate lighting in your home to reduce risk of falls? Yes   ASSISTIVE DEVICES UTILIZED TO PREVENT FALLS:  Life alert? No  Use of a cane, walker or w/c? No  Grab bars in the bathroom? No  Shower chair or bench in shower? Yes  Elevated toilet seat or a handicapped toilet? No   TIMED UP AND GO:  Was the test performed? No .  Length of time to ambulate 10 feet: n/a sec.   Gait steady and fast without use of assistive device  Cognitive Function: Normal cognitive status assessed by direct observation by this Nurse Health Advisor. No abnormalities found.   MMSE - Mini Mental State Exam 01/16/2018  Orientation to time 5  Orientation to Place 5  Registration 3  Attention/ Calculation 5  Recall 3  Language- name 2 objects 2  Language- repeat 1  Language- follow 3 step command 3  Language- read & follow direction 1  Write a sentence 1  Copy design 1  Total score 30        Immunizations Immunization History  Administered Date(s) Administered   Fluad Quad(high Dose 65+) 09/21/2020   Influenza Split 09/17/2011, 09/21/2012   Influenza Whole 09/07/2010, 09/19/2016   Influenza, High Dose Seasonal PF 08/30/2017, 08/30/2018, 09/05/2019, 08/27/2021   Influenza-Unspecified 09/05/2014, 09/15/2015   PFIZER(Purple Top)SARS-COV-2 Vaccination 03/20/2020, 04/14/2020   PNEUMOCOCCAL CONJUGATE-20 08/27/2021   Pneumococcal Conjugate-13 07/04/2015   Pneumococcal Polysaccharide-23 03/19/2014, 05/05/2021   Td 07/04/2015   Zoster Recombinat (Shingrix) 08/09/2019, 11/06/2019   Zoster, Live 09/28/2016    TDAP status: Up to date  Flu Vaccine status: Up to date  Pneumococcal vaccine status: Up to date  Covid-19 vaccine status: Completed vaccines  Qualifies for Shingles Vaccine? Yes   Zostavax completed Yes   Shingrix Completed?: Yes  Screening Tests Health Maintenance  Topic Date Due   COVID-19 Vaccine (3 - Pfizer risk series) 05/12/2020   TETANUS/TDAP  07/03/2025   Pneumonia Vaccine 46+ Years old  Completed   INFLUENZA VACCINE  Completed   DEXA SCAN  Completed   Hepatitis C Screening  Completed   Zoster Vaccines- Shingrix  Completed   HPV VACCINES  Aged Out    Health Maintenance  Health Maintenance Due  Topic Date Due   COVID-19 Vaccine (3 - Pfizer risk series) 05/12/2020    Colorectal cancer screening: No longer required.   Mammogram status: Completed 03/02/2021. Repeat every year  Bone Density status: Completed 02/16/2012. Results reflect: Bone density results: OSTEOPENIA. Repeat every 2-3 years.  Lung Cancer Screening: (Low Dose CT Chest recommended if Age 62-80 years, 30 pack-year currently smoking OR have quit w/in 15years.) does  not qualify.   Lung Cancer Screening Referral: no  Additional Screening:  Hepatitis C Screening: does qualify; Completed yes  Vision Screening: Recommended annual ophthalmology exams for early detection of glaucoma and other disorders of the eye. Is the patient up to date with their annual eye exam?  Yes  Who is the provider or what is the name of the office in which the patient attends annual eye exams? Dr. Tempie Hoist & Dr. Monna Fam If pt is not established with a provider, would they like to be referred to a provider to establish care? No .   Dental Screening: Recommended annual dental exams for proper oral hygiene  Community Resource Referral / Chronic Care Management: CRR required this visit?  No   CCM required this visit?  No      Plan:     I have personally reviewed and noted the following in the patient's chart:   Medical and social history Use of alcohol, tobacco or illicit drugs  Current medications and supplements including opioid prescriptions.  Functional ability and status Nutritional status Physical activity Advanced directives List of other physicians Hospitalizations, surgeries, and ER visits in previous 12 months Vitals Screenings to include cognitive, depression, and falls Referrals and appointments  In addition, I have reviewed and discussed with patient certain preventive protocols, quality metrics, and best practice recommendations. A written personalized care plan for preventive services as well as general preventive health recommendations were provided to patient.     Sheral Flow, LPN   17/0/0174   Nurse Notes:  Patient is cogitatively intact. There were no vitals filed for this  visit. There is no height or weight on file to calculate BMI. Patient stated that she has no issues with gait or balance; does not use any assistive devices. Medications reviewed with patient; no opioid use noted. Hearing Screening - Comments:: Patient  denied any hearing difficulty.  No hearing aids needed. Vision Screening - Comments:: Eye exam done by: Dr. Tempie Hoist and Dr. Monna Fam.  Medical screening examination/treatment/procedure(s) were performed by non-physician practitioner and as supervising physician I was immediately available for consultation/collaboration.  I agree with above. Lew Dawes, MD

## 2021-11-04 ENCOUNTER — Ambulatory Visit: Payer: PPO | Admitting: Internal Medicine

## 2021-11-06 ENCOUNTER — Other Ambulatory Visit: Payer: Self-pay

## 2021-11-06 ENCOUNTER — Encounter: Payer: Self-pay | Admitting: Internal Medicine

## 2021-11-06 ENCOUNTER — Ambulatory Visit (INDEPENDENT_AMBULATORY_CARE_PROVIDER_SITE_OTHER): Payer: PPO | Admitting: Internal Medicine

## 2021-11-06 DIAGNOSIS — K219 Gastro-esophageal reflux disease without esophagitis: Secondary | ICD-10-CM

## 2021-11-06 DIAGNOSIS — C801 Malignant (primary) neoplasm, unspecified: Secondary | ICD-10-CM | POA: Diagnosis not present

## 2021-11-06 DIAGNOSIS — E785 Hyperlipidemia, unspecified: Secondary | ICD-10-CM | POA: Diagnosis not present

## 2021-11-06 DIAGNOSIS — Z853 Personal history of malignant neoplasm of breast: Secondary | ICD-10-CM | POA: Diagnosis not present

## 2021-11-06 DIAGNOSIS — I480 Paroxysmal atrial fibrillation: Secondary | ICD-10-CM | POA: Diagnosis not present

## 2021-11-06 DIAGNOSIS — M8580 Other specified disorders of bone density and structure, unspecified site: Secondary | ICD-10-CM | POA: Diagnosis not present

## 2021-11-06 LAB — COMPREHENSIVE METABOLIC PANEL
ALT: 11 U/L (ref 0–35)
AST: 18 U/L (ref 0–37)
Albumin: 4 g/dL (ref 3.5–5.2)
Alkaline Phosphatase: 91 U/L (ref 39–117)
BUN: 13 mg/dL (ref 6–23)
CO2: 30 mEq/L (ref 19–32)
Calcium: 9.2 mg/dL (ref 8.4–10.5)
Chloride: 99 mEq/L (ref 96–112)
Creatinine, Ser: 0.61 mg/dL (ref 0.40–1.20)
GFR: 87.62 mL/min (ref >60.00)
Glucose, Bld: 97 mg/dL (ref 70–99)
Potassium: 4.4 mEq/L (ref 3.5–5.1)
Sodium: 135 mEq/L (ref 135–145)
Total Bilirubin: 0.6 mg/dL (ref 0.2–1.2)
Total Protein: 6.7 g/dL (ref 6.0–8.3)

## 2021-11-06 LAB — LIPID PANEL
Cholesterol: 147 mg/dL (ref 0–200)
HDL: 67.1 mg/dL (ref >39.00)
LDL Cholesterol: 64 mg/dL (ref 0–99)
NonHDL: 79.88
Total CHOL/HDL Ratio: 2
Triglycerides: 79 mg/dL (ref 0.0–149.0)
VLDL: 15.8 mg/dL (ref 0.0–40.0)

## 2021-11-06 LAB — TSH: TSH: 2.33 u[IU]/mL (ref 0.35–5.50)

## 2021-11-06 NOTE — Assessment & Plan Note (Signed)
No relapse in 5 years - now off Xarelto

## 2021-11-06 NOTE — Assessment & Plan Note (Signed)
Cont on Simvastatin 

## 2021-11-06 NOTE — Progress Notes (Signed)
Subjective:  Patient ID: Vicki [name], female    DOB: 07/08/1946  Age: 75 y.o. MRN: [id number]  CC: Follow-up (6 month f/u)   HPI Vicki [name] presents for A fib - off Xarelto, GERD, dyslipidemia f/u. H/o breast cancer  Outpatient Medications Prior to Visit  Medication Sig Dispense Refill   acetaminophen (TYLENOL) 325 MG tablet Take 500 mg by mouth every 6 (six) hours as needed for mild pain or fever.      cholecalciferol (VITAMIN D) 1000 UNITS tablet Take 1,000 Units by mouth 2 (two) times daily.     famotidine (PEPCID) 10 MG tablet Take 10 mg by mouth daily.     loratadine (CLARITIN) 10 MG tablet Take 10 mg by mouth daily.     Melatonin 3 MG TABS Take 3 mg by mouth at bedtime as needed (for sleep).      Multiple Vitamins-Minerals (ICAPS) TABS Take 1 tablet by mouth 2 (two) times daily.      simvastatin (ZOCOR) 20 MG tablet TAKE 1 TABLET BY MOUTH AT BEDTIME 90 tablet 1   FLUZONE HIGH-DOSE QUADRIVALENT 0.7 ML SUSY  (Patient not taking: Reported on 11/06/2021)     No facility-administered medications prior to visit.    ROS: Review of Systems  Constitutional:  Negative for activity change, appetite change, chills, fatigue and unexpected weight change.  HENT:  Negative for congestion, mouth sores and sinus pressure.   Eyes:  Negative for visual disturbance.  Respiratory:  Negative for cough and chest tightness.   Gastrointestinal:  Negative for abdominal pain and nausea.  Genitourinary:  Negative for difficulty urinating, frequency and vaginal pain.  Musculoskeletal:  Negative for back pain and gait problem.  Skin:  Negative for pallor and rash.  Neurological:  Negative for dizziness, tremors, weakness, numbness and headaches.  Psychiatric/Behavioral:  Negative for confusion and sleep disturbance.    Objective:  BP 132/82 (BP Location: Left Arm)   Pulse (!) 59   Temp 97.9 F (36.6 C) (Oral)   Ht 5\' 3"  (1.6 m)   Wt 118 lb 9.6 oz (53.8 kg)   SpO2 93%   BMI 21.01  kg/m   BP Readings from Last 3 Encounters:  11/06/21 132/82  10/06/21 120/84  05/05/21 132/80    Wt Readings from Last 3 Encounters:  11/06/21 118 lb 9.6 oz (53.8 kg)  10/06/21 119 lb (54 kg)  05/05/21 116 lb (52.6 kg)    Physical Exam Constitutional:      General: She is not in acute distress.    Appearance: She is well-developed.  HENT:     Head: Normocephalic.     Right Ear: External ear normal.     Left Ear: External ear normal.     Nose: Nose normal.  Eyes:     General:        Right eye: No discharge.        Left eye: No discharge.     Conjunctiva/sclera: Conjunctivae normal.     Pupils: Pupils are equal, round, and reactive to light.  Neck:     Thyroid: No thyromegaly.     Vascular: No JVD.     Trachea: No tracheal deviation.  Cardiovascular:     Rate and Rhythm: Normal rate and regular rhythm.     Heart sounds: Normal heart sounds.  Pulmonary:     Effort: No respiratory distress.     Breath sounds: No stridor. No wheezing.  Abdominal:     General: Bowel sounds are  normal. There is no distension.     Palpations: Abdomen is soft. There is no mass.     Tenderness: There is no abdominal tenderness. There is no guarding or rebound.  Musculoskeletal:        General: No tenderness.     Cervical back: Normal range of motion and neck supple. No rigidity.  Lymphadenopathy:     Cervical: No cervical adenopathy.  Skin:    Findings: No erythema or rash.  Neurological:     Mental Status: She is oriented to person, place, and time.     Cranial Nerves: No cranial nerve deficit.     Motor: No abnormal muscle tone.     Coordination: Coordination normal.     Deep Tendon Reflexes: Reflexes normal.  Psychiatric:        Behavior: Behavior normal.        Thought Content: Thought content normal.        Judgment: Judgment normal.    Lab Results  Component Value Date   WBC 5.9 11/04/2020   HGB 12.7 11/04/2020   HCT 38.0 11/04/2020   PLT 281.0 11/04/2020   GLUCOSE 97  11/06/2021   CHOL 147 11/06/2021   TRIG 79.0 11/06/2021   HDL 67.10 11/06/2021   LDLCALC 64 11/06/2021   ALT 11 11/06/2021   AST 18 11/06/2021   NA 135 11/06/2021   K 4.4 11/06/2021   CL 99 11/06/2021   CREATININE 0.61 11/06/2021   BUN 13 11/06/2021   CO2 30 11/06/2021   TSH 2.33 11/06/2021   INR 1.41 08/07/2015    MM 3D SCREEN BREAST BILATERAL  Result Date: 03/04/2021 CLINICAL DATA:  Screening. EXAM: DIGITAL SCREENING BILATERAL MAMMOGRAM WITH TOMOSYNTHESIS AND CAD TECHNIQUE: Bilateral screening digital craniocaudal and mediolateral oblique mammograms were obtained. Bilateral screening digital breast tomosynthesis was performed. The images were evaluated with computer-aided detection. COMPARISON:  Previous exam(s). ACR Breast Density Category b: There are scattered areas of fibroglandular density. FINDINGS: There are no findings suspicious for malignancy. The images were evaluated with computer-aided detection. IMPRESSION: No mammographic evidence of malignancy. A result letter of this screening mammogram will be mailed directly to the patient. RECOMMENDATION: Screening mammogram in one year. (Code:SM-B-01Y) BI-RADS CATEGORY  1: Negative. Electronically Signed   By: Ammie Ferrier M.D.   On: 03/04/2021 08:01    Assessment & Plan:   Problem List Items Addressed This Visit     RESOLVED: Cancer ([place])    Mammogram q 12 mo      Dyslipidemia    Cont on Simvastatin      Relevant Orders   TSH (Completed)   Comprehensive metabolic panel (Completed)   Lipid panel (Completed)   GERD    Cont w/Pepcid      History of breast cancer    H/o left lumpectomy 2008 Mammo q 12 months      Osteopenia    On Vit D      PAROXYSMAL ATRIAL FIBRILLATION    No relapse in 5 years - now off Xarelto      Relevant Orders   TSH (Completed)   Comprehensive metabolic panel (Completed)      No orders of the defined types were placed in this encounter.     Follow-up: Return in about 6  months (around 05/07/2022) for Wellness Exam.  Walker Kehr, MD

## 2021-11-06 NOTE — Assessment & Plan Note (Signed)
Mammogram q 12 mo

## 2021-11-06 NOTE — Assessment & Plan Note (Signed)
On Vit D 

## 2021-11-06 NOTE — Assessment & Plan Note (Signed)
Cont w/Pepcid 

## 2021-11-06 NOTE — Assessment & Plan Note (Signed)
H/o left lumpectomy 2008 Mammo q 12 months

## 2021-11-10 ENCOUNTER — Encounter (INDEPENDENT_AMBULATORY_CARE_PROVIDER_SITE_OTHER): Payer: PPO | Admitting: Ophthalmology

## 2021-11-10 ENCOUNTER — Other Ambulatory Visit: Payer: Self-pay

## 2021-11-10 DIAGNOSIS — H353221 Exudative age-related macular degeneration, left eye, with active choroidal neovascularization: Secondary | ICD-10-CM

## 2021-11-10 DIAGNOSIS — I10 Essential (primary) hypertension: Secondary | ICD-10-CM

## 2021-11-10 DIAGNOSIS — H43813 Vitreous degeneration, bilateral: Secondary | ICD-10-CM

## 2021-11-10 DIAGNOSIS — H35033 Hypertensive retinopathy, bilateral: Secondary | ICD-10-CM | POA: Diagnosis not present

## 2021-11-10 DIAGNOSIS — H353112 Nonexudative age-related macular degeneration, right eye, intermediate dry stage: Secondary | ICD-10-CM | POA: Diagnosis not present

## 2022-01-04 ENCOUNTER — Other Ambulatory Visit: Payer: Self-pay | Admitting: Internal Medicine

## 2022-01-04 DIAGNOSIS — Z1231 Encounter for screening mammogram for malignant neoplasm of breast: Secondary | ICD-10-CM

## 2022-01-11 DIAGNOSIS — M791 Myalgia, unspecified site: Secondary | ICD-10-CM | POA: Diagnosis not present

## 2022-01-11 DIAGNOSIS — J209 Acute bronchitis, unspecified: Secondary | ICD-10-CM | POA: Diagnosis not present

## 2022-01-11 DIAGNOSIS — Z20828 Contact with and (suspected) exposure to other viral communicable diseases: Secondary | ICD-10-CM | POA: Diagnosis not present

## 2022-02-03 ENCOUNTER — Encounter: Payer: Self-pay | Admitting: Internal Medicine

## 2022-02-03 ENCOUNTER — Other Ambulatory Visit: Payer: Self-pay

## 2022-02-03 ENCOUNTER — Ambulatory Visit (INDEPENDENT_AMBULATORY_CARE_PROVIDER_SITE_OTHER): Payer: PPO | Admitting: Internal Medicine

## 2022-02-03 VITALS — BP 134/66 | HR 63 | Temp 98.3°F | Ht 63.0 in | Wt 121.2 lb

## 2022-02-03 DIAGNOSIS — R051 Acute cough: Secondary | ICD-10-CM | POA: Diagnosis not present

## 2022-02-03 DIAGNOSIS — R0789 Other chest pain: Secondary | ICD-10-CM | POA: Diagnosis not present

## 2022-02-03 MED ORDER — PREDNISONE 20 MG PO TABS: 40.0000 mg | ORAL_TABLET | Freq: Every day | ORAL | 0 refills | Status: DC

## 2022-02-03 MED ORDER — BENZONATATE 100 MG PO CAPS: 100.0000 mg | ORAL_CAPSULE | Freq: Three times a day (TID) | ORAL | 1 refills | Status: DC | PRN

## 2022-02-03 NOTE — Progress Notes (Signed)
? ? ?Subjective:  ? ? Patient ID: Vicki [name], female    DOB: Nov 09, 1946, 76 y.o.   MRN: [id number] ? ?This visit occurred during the SARS-CoV-2 public health emergency.  Safety protocols were in place, including screening questions prior to the visit, additional usage of staff PPE, and extensive cleaning of exam room while observing appropriate contact time as indicated for disinfecting solutions. ? ? ? ?HPI ?Vicki [name] is here for  ?Chief Complaint  ?Patient presents with  ? Follow-up  ? Cough  ? ? ?Sick for one month - went to urgent care and flu/covid tests neg.  Cxr neg.   She was being treated for bronchitis.  O2 was 92%.  BP was a little low.  Placed on cortisone x 5 days and 10 days of abx.  She is taking cough syrup.  She did feel a little better.   ? ?She is still coughing dry cough day and night.   chest feels tight.  She is fatigued and she still feels bad.  ? ? ? ? ?Medications and allergies reviewed with patient and updated if appropriate. ? ?Current Outpatient Medications on File Prior to Visit  ?Medication Sig Dispense Refill  ? acetaminophen (TYLENOL) 325 MG tablet Take 500 mg by mouth every 6 (six) hours as needed for mild pain or fever.     ? cholecalciferol (VITAMIN D) 1000 UNITS tablet Take 1,000 Units by mouth 2 (two) times daily.    ? famotidine (PEPCID) 10 MG tablet Take 10 mg by mouth daily.    ? loratadine (CLARITIN) 10 MG tablet Take 10 mg by mouth daily.    ? Melatonin 3 MG TABS Take 3 mg by mouth at bedtime as needed (for sleep).     ? Multiple Vitamins-Minerals (ICAPS) TABS Take 1 tablet by mouth 2 (two) times daily.     ? promethazine-dextromethorphan (PROMETHAZINE-DM) 6.25-15 MG/5ML syrup Take 5-10 mLs by mouth every 4 (four) hours as needed.    ? simvastatin (ZOCOR) 20 MG tablet TAKE 1 TABLET BY MOUTH AT BEDTIME 90 tablet 1  ? ?No current facility-administered medications on file prior to visit.  ? ? ?Review of Systems  ?Constitutional:  Positive for fatigue. Negative for chills and  fever.  ?HENT:  Negative for congestion, ear pain, rhinorrhea, sinus pressure, sinus pain and sore throat.   ?Respiratory:  Positive for cough (dry hacking cough), chest tightness, shortness of breath (mild at times) and wheezing.   ?Gastrointestinal:  Negative for diarrhea (loose stool 1 per day).  ?Neurological:  Positive for headaches (mild). Negative for dizziness and light-headedness.  ? ?   ?Objective:  ? ?Vitals:  ? 02/03/22 1453  ?BP: 134/66  ?Pulse: 63  ?Temp: 98.3 ?F (36.8 ?C)  ?SpO2: 96%  ? ?BP Readings from Last 3 Encounters:  ?02/03/22 134/66  ?11/06/21 132/82  ?10/06/21 120/84  ? ?Wt Readings from Last 3 Encounters:  ?02/03/22 121 lb 4 oz (55 kg)  ?11/06/21 118 lb 9.6 oz (53.8 kg)  ?10/06/21 119 lb (54 kg)  ? ?Body mass index is 21.48 kg/m?. ? ?  ?Physical Exam ?Constitutional:   ?   General: She is not in acute distress. ?   Appearance: Normal appearance. She is not ill-appearing.  ?HENT:  ?   Head: Normocephalic and atraumatic.  ?   Right Ear: Tympanic membrane, ear canal and external ear normal.  ?   Left Ear: Tympanic membrane, ear canal and external ear normal.  ?   Mouth/Throat:  ?  Mouth: Mucous membranes are moist.  ?   Pharynx: No oropharyngeal exudate or posterior oropharyngeal erythema.  ?Eyes:  ?   Conjunctiva/sclera: Conjunctivae normal.  ?Cardiovascular:  ?   Rate and Rhythm: Normal rate and regular rhythm.  ?Pulmonary:  ?   Effort: Pulmonary effort is normal. No respiratory distress.  ?   Breath sounds: Normal breath sounds. No wheezing or rales.  ?Musculoskeletal:  ?   Cervical back: Neck supple. No tenderness.  ?Lymphadenopathy:  ?   Cervical: No cervical adenopathy.  ?Skin: ?   General: Skin is warm and dry.  ?Neurological:  ?   Mental Status: She is alert.  ? ?   ? ? ? ? ? ?Assessment & Plan:  ? ? ?Cough, chest tightness ?Acute ?Secondary to recent URI/ ? Bronchitis ?CXR neg at urgent care, covid/flu neg ?Treated with steroids, abx, cough syrup ?A little better, but persistent  cough, chest tightness and fatigue ?Likely reactive airway disease ?Prednisone 40 mg daily x 5 days ?Tessalon perles 100-200 mg TID prn ?Can refill cough syrup if needed ? ?F/u if needed ? ? ? ?

## 2022-02-03 NOTE — Patient Instructions (Addendum)
? ? ? ? ? ? ?  Medications changes include :   tessalon perles three times a day for cough, prednisone 40 mg daily x 5 days.  ? ? ?Your prescription(s) have been sent to your pharmacy.  ? ? ? ?

## 2022-02-24 ENCOUNTER — Other Ambulatory Visit: Payer: Self-pay | Admitting: Internal Medicine

## 2022-03-02 ENCOUNTER — Other Ambulatory Visit: Payer: Self-pay

## 2022-03-02 ENCOUNTER — Encounter (INDEPENDENT_AMBULATORY_CARE_PROVIDER_SITE_OTHER): Payer: PPO | Admitting: Ophthalmology

## 2022-03-02 DIAGNOSIS — H35033 Hypertensive retinopathy, bilateral: Secondary | ICD-10-CM

## 2022-03-02 DIAGNOSIS — H43813 Vitreous degeneration, bilateral: Secondary | ICD-10-CM

## 2022-03-02 DIAGNOSIS — H353221 Exudative age-related macular degeneration, left eye, with active choroidal neovascularization: Secondary | ICD-10-CM | POA: Diagnosis not present

## 2022-03-02 DIAGNOSIS — I10 Essential (primary) hypertension: Secondary | ICD-10-CM

## 2022-03-02 DIAGNOSIS — H353111 Nonexudative age-related macular degeneration, right eye, early dry stage: Secondary | ICD-10-CM

## 2022-03-04 ENCOUNTER — Ambulatory Visit
Admission: RE | Admit: 2022-03-04 | Discharge: 2022-03-04 | Disposition: A | Discharge status: Home / Self Care | Payer: PPO | Source: Ambulatory Visit | Attending: Internal Medicine | Admitting: Internal Medicine

## 2022-03-04 DIAGNOSIS — Z1231 Encounter for screening mammogram for malignant neoplasm of breast: Secondary | ICD-10-CM | POA: Diagnosis not present

## 2022-03-21 ENCOUNTER — Encounter: Payer: Self-pay | Admitting: Internal Medicine

## 2022-03-21 NOTE — Progress Notes (Signed)
? ? ?Subjective:  ? ? Patient ID: Vicki [name], female    DOB: 1946/11/11, 76 y.o.   MRN: [id number] ? ?This visit occurred during the SARS-CoV-2 public health emergency.  Safety protocols were in place, including screening questions prior to the visit, additional usage of staff PPE, and extensive cleaning of exam room while observing appropriate contact time as indicated for disinfecting solutions. ? ? ? ?HPI ?Breanah is here for  ?Chief Complaint  ?Patient presents with  ? pain in both legs  ?  Has gotten better over the days but still hurts when going up stairs  ? ? ? ?Pain in legs - it started about two weeks ago.  It felt like her lower back and butt were hurting initially.  It then went to her legs-mostly her upper leg/hip area.  She could barely walk.  It was very bad when going up stairs.  She denies any obvious cause, but she did have 2 events in her house 2 weekends in a row and there was a lot of preparation for it so she was doing more than she usually does.  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 ? ? ?No N/T.  Legs felt weak.    Feels like muscle spasms at times.  Started with just right leg and was primarily in the upper leg region near her hip.  She did have some similar soreness in the left leg, but was not as bad.  When stepping up feels like a nerve catches.   ? ?Last week tylenol helped.  Aleve did not help.  If she laid down on the couch last night and her legs were straight it helped having them stretched out.   ? ?Lower back pain has always been tender.  She denies a history of sciatica. ? ? ? ? ?Medications and allergies reviewed with patient and updated if appropriate. ? ?Current Outpatient Medications on File Prior to Visit  ?Medication Sig Dispense Refill  ? acetaminophen (TYLENOL) 325 MG tablet Take 500 mg by mouth every 6 (six) hours as needed for mild pain or fever.     ? benzonatate (TESSALON PERLES) 100 MG capsule Take 1-2 capsules (100-200 mg total) by mouth 3  (three) times daily as needed for cough. 40 capsule 1  ? cholecalciferol (VITAMIN D) 1000 UNITS tablet Take 1,000 Units by mouth 2 (two) times daily.    ? famotidine (PEPCID) 10 MG tablet Take 10 mg by mouth daily.    ? loratadine (CLARITIN) 10 MG tablet Take 10 mg by mouth daily.    ? Melatonin 3 MG TABS Take 3 mg by mouth at bedtime as needed (for sleep).     ? Multiple Vitamins-Minerals (ICAPS) TABS Take 1 tablet by mouth 2 (two) times daily.     ? predniSONE (DELTASONE) 20 MG tablet Take 2 tablets (40 mg total) by mouth daily with breakfast. 10 tablet 0  ? promethazine-dextromethorphan (PROMETHAZINE-DM) 6.25-15 MG/5ML syrup Take 5-10 mLs by mouth every 4 (four) hours as needed.    ? simvastatin (ZOCOR) 20 MG tablet TAKE 1 TABLET BY MOUTH AT BEDTIME 90 tablet 0  ? ?No current facility-administered medications on file prior to visit.  ? ? ?Review of Systems ? ?   ?Objective:  ? ?Vitals:  ? 03/22/22 1437  ?BP: 126/72  ?Pulse: (!) 58  ?Temp: 97.7 ?F (36.5 ?C)  ?SpO2: 94%  ? ?BP Readings from Last 3 Encounters:  ?03/22/22 126/72  ?02/03/22 134/66  ?  11/06/21 132/82  ? ?Wt Readings from Last 3 Encounters:  ?03/22/22 121 lb (54.9 kg)  ?02/03/22 121 lb 4 oz (55 kg)  ?11/06/21 118 lb 9.6 oz (53.8 kg)  ? ?Body mass index is 21.43 kg/m?. ? ?  ?Physical Exam ?Constitutional:   ?   General: She is not in acute distress. ?   Appearance: Normal appearance.  ?HENT:  ?   Head: Normocephalic and atraumatic.  ?Musculoskeletal:     ?   General: No tenderness (No tenderness along lumbar spine or across lower back).  ?   Right lower leg: No edema.  ?   Left lower leg: No edema.  ?Skin: ?   General: Skin is warm and dry.  ?Neurological:  ?   Mental Status: She is alert.  ?   Sensory: No sensory deficit.  ?   Motor: No weakness.  ?   Gait: Gait normal.  ? ?  ? ? ? ?Assessment & Plan:  ? ? ?Chronic lower back pain with some acute radiculopathy: ?Has always had some intermittent lower back pain, past 2 weeks she has had lower back/buttock  pain with radiation into the hips, upper legs-right leg worse than left ?Symptoms have improved with Tylenol and time ?Discussed treatment options and she would prefer not to take any prescription medication if possible ?Will get lumbar x-ray today ?Continue Tylenol as needed ?Hopefully her symptoms will continue to improve and resolve ?Discussed trying some back exercises to see if that helps with resolving her pain and preventing it in the future ?Discussed muscle relaxers, physical therapy, referral to sports medicine in the future if her pain does not resolve or returns ? ? ? ? ? ?

## 2022-03-22 ENCOUNTER — Ambulatory Visit (INDEPENDENT_AMBULATORY_CARE_PROVIDER_SITE_OTHER): Payer: PPO | Admitting: Internal Medicine

## 2022-03-22 ENCOUNTER — Ambulatory Visit (INDEPENDENT_AMBULATORY_CARE_PROVIDER_SITE_OTHER): Payer: PPO

## 2022-03-22 VITALS — BP 126/72 | HR 58 | Temp 97.7°F | Ht 63.0 in | Wt 121.0 lb

## 2022-03-22 DIAGNOSIS — M5442 Lumbago with sciatica, left side: Secondary | ICD-10-CM

## 2022-03-22 DIAGNOSIS — M5136 Other intervertebral disc degeneration, lumbar region: Secondary | ICD-10-CM | POA: Diagnosis not present

## 2022-03-22 DIAGNOSIS — M5440 Lumbago with sciatica, unspecified side: Secondary | ICD-10-CM

## 2022-03-22 DIAGNOSIS — G8929 Other chronic pain: Secondary | ICD-10-CM | POA: Diagnosis not present

## 2022-03-22 DIAGNOSIS — M545 Low back pain, unspecified: Secondary | ICD-10-CM | POA: Diagnosis not present

## 2022-03-22 NOTE — Patient Instructions (Addendum)
? ? ? ?An xray was ordered.   ? ? ?Medications changes include :   none ? ? ? ? ?Back Exercises ?These exercises help to make your trunk and back strong. They also help to keep the lower back flexible. Doing these exercises can help to prevent or lessen pain in your lower back. ?If you have back pain, try to do these exercises 2-3 times each day or as told by your doctor. ?As you get better, do the exercises once each day. Repeat the exercises more often as told by your doctor. ?To stop back pain from coming back, do the exercises once each day, or as told by your doctor. ?Do exercises exactly as told by your doctor. Stop right away if you feel sudden pain or your pain gets worse. ?Exercises ?Single knee to chest ?Do these steps 3-5 times in a row for each leg: ?Lie on your back on a firm bed or the floor with your legs stretched out. ?Bring one knee to your chest. ?Grab your knee or thigh with both hands and hold it in place. ?Pull on your knee until you feel a gentle stretch in your lower back or butt. ?Keep doing the stretch for 10-30 seconds. ?Slowly let go of your leg and straighten it. ?Pelvic tilt ?Do these steps 5-10 times in a row: ?Lie on your back on a firm bed or the floor with your legs stretched out. ?Bend your knees so they point up to the ceiling. Your feet should be flat on the floor. ?Tighten your lower belly (abdomen) muscles to press your lower back against the floor. This will make your tailbone point up to the ceiling instead of pointing down to your feet or the floor. ?Stay in this position for 5-10 seconds while you gently tighten your muscles and breathe evenly. ?Cat-cow ?Do these steps until your lower back bends more easily: ?Get on your hands and knees on a firm bed or the floor. Keep your hands under your shoulders, and keep your knees under your hips. You may put padding under your knees. ?Let your head hang down toward your chest. Tighten (contract) the muscles in your belly. Point  your tailbone toward the floor so your lower back becomes rounded like the back of a cat. ?Stay in this position for 5 seconds. ?Slowly lift your head. Let the muscles of your belly relax. Point your tailbone up toward the ceiling so your back forms a sagging arch like the back of a cow. ?Stay in this position for 5 seconds. ? ?Press-ups ?Do these steps 5-10 times in a row: ?Lie on your belly (face-down) on a firm bed or the floor. ?Place your hands near your head, about shoulder-width apart. ?While you keep your back relaxed and keep your hips on the floor, slowly straighten your arms to raise the top half of your body and lift your shoulders. Do not use your back muscles. You may change where you place your hands to make yourself more comfortable. ?Stay in this position for 5 seconds. Keep your back relaxed. ?Slowly return to lying flat on the floor. ? ?Bridges ?Do these steps 10 times in a row: ?Lie on your back on a firm bed or the floor. ?Bend your knees so they point up to the ceiling. Your feet should be flat on the floor. Your arms should be flat at your sides, next to your body. ?Tighten your butt muscles and lift your butt off the floor until your waist  is almost as high as your knees. If you do not feel the muscles working in your butt and the back of your thighs, slide your feet 1-2 inches (2.5-5 cm) farther away from your butt. ?Stay in this position for 3-5 seconds. ?Slowly lower your butt to the floor, and let your butt muscles relax. ?If this exercise is too easy, try doing it with your arms crossed over your chest. ?Belly crunches ?Do these steps 5-10 times in a row: ?Lie on your back on a firm bed or the floor with your legs stretched out. ?Bend your knees so they point up to the ceiling. Your feet should be flat on the floor. ?Cross your arms over your chest. ?Tip your chin a little bit toward your chest, but do not bend your neck. ?Tighten your belly muscles and slowly raise your chest just  enough to lift your shoulder blades a tiny bit off the floor. Avoid raising your body higher than that because it can put too much stress on your lower back. ?Slowly lower your chest and your head to the floor. ?Back lifts ?Do these steps 5-10 times in a row: ?Lie on your belly (face-down) with your arms at your sides, and rest your forehead on the floor. ?Tighten the muscles in your legs and your butt. ?Slowly lift your chest off the floor while you keep your hips on the floor. Keep the back of your head in line with the curve in your back. Look at the floor while you do this. ?Stay in this position for 3-5 seconds. ?Slowly lower your chest and your face to the floor. ?Contact a doctor if: ?Your back pain gets a lot worse when you do an exercise. ?Your back pain does not get better within 2 hours after you exercise. ?If you have any of these problems, stop doing the exercises. Do not do them again unless your doctor says it is okay. ?Get help right away if: ?You have sudden, very bad back pain. If this happens, stop doing the exercises. Do not do them again unless your doctor says it is okay. ?This information is not intended to replace advice given to you by your health care provider. Make sure you discuss any questions you have with your health care provider. ?Document Revised: 02/04/2021 Document Reviewed: 02/04/2021 ?Elsevier Patient Education ? [place]. ? ?

## 2022-03-24 ENCOUNTER — Telehealth: Payer: Self-pay | Admitting: Internal Medicine

## 2022-03-24 NOTE — Telephone Encounter (Signed)
Results have not been reviewed yet 

## 2022-03-24 NOTE — Telephone Encounter (Signed)
Results given to husband today ?

## 2022-03-24 NOTE — Telephone Encounter (Signed)
Xray shows diffuse arthritis - no other concerning findings.  ?

## 2022-03-24 NOTE — Telephone Encounter (Signed)
Pt requesting a cb w/ 03-22-2022 imaging results ordered by Dr. Quay Burow ? ?Pt states if she does not answer, please leave results on vm ? ? ? ? ?

## 2022-05-10 ENCOUNTER — Encounter: Payer: Self-pay | Admitting: Internal Medicine

## 2022-05-10 ENCOUNTER — Ambulatory Visit (INDEPENDENT_AMBULATORY_CARE_PROVIDER_SITE_OTHER): Payer: PPO | Admitting: Internal Medicine

## 2022-05-10 DIAGNOSIS — R29898 Other symptoms and signs involving the musculoskeletal system: Secondary | ICD-10-CM | POA: Diagnosis not present

## 2022-05-10 DIAGNOSIS — M7061 Trochanteric bursitis, right hip: Secondary | ICD-10-CM | POA: Diagnosis not present

## 2022-05-10 DIAGNOSIS — E785 Hyperlipidemia, unspecified: Secondary | ICD-10-CM

## 2022-05-10 DIAGNOSIS — M545 Low back pain, unspecified: Secondary | ICD-10-CM | POA: Diagnosis not present

## 2022-05-10 DIAGNOSIS — M7062 Trochanteric bursitis, left hip: Secondary | ICD-10-CM | POA: Diagnosis not present

## 2022-05-10 MED ORDER — TRIAMCINOLONE ACETONIDE 0.5 % EX OINT
1.0000 | TOPICAL_OINTMENT | Freq: Two times a day (BID) | CUTANEOUS | 3 refills | Status: DC
Start: 2022-05-10 — End: 2024-03-29

## 2022-05-10 MED ORDER — SIMVASTATIN 20 MG PO TABS: 20.0000 mg | ORAL_TABLET | Freq: Every day | ORAL | 3 refills | Status: DC

## 2022-05-10 NOTE — Assessment & Plan Note (Signed)
Blue-Emu cream was recommended to use 2-3 times a day RTC 3 wks

## 2022-05-10 NOTE — Patient Instructions (Addendum)
Hold Simvastatin until you see me  Blue-Emu cream --use 2-3 times a day  Hip opener exercises

## 2022-05-10 NOTE — Assessment & Plan Note (Signed)
Started in 03/2022 Back/hip pain and weakness D/c Simvastatin for now RTC 3 wks

## 2022-05-10 NOTE — Progress Notes (Signed)
Subjective:  Patient ID: Vicki [name], female    DOB: 11/06/1946  Age: 76 y.o. MRN: [id number]  CC: No chief complaint on file.   HPI Vania Rosero presents for LBP, hip pain, leg weakness on the L. C/o OA. Started in 03/2022. Back/hip pain and weakness; B hip pain D/c Simvastatin for now   Outpatient Medications Prior to Visit  Medication Sig Dispense Refill   acetaminophen (TYLENOL) 325 MG tablet Take 500 mg by mouth every 6 (six) hours as needed for mild pain or fever.      cholecalciferol (VITAMIN D) 1000 UNITS tablet Take 1,000 Units by mouth 2 (two) times daily.     famotidine (PEPCID) 10 MG tablet Take 10 mg by mouth daily.     loratadine (CLARITIN) 10 MG tablet Take 10 mg by mouth daily.     Melatonin 3 MG TABS Take 3 mg by mouth at bedtime as needed (for sleep).      Multiple Vitamins-Minerals (ICAPS) TABS Take 1 tablet by mouth 2 (two) times daily.      benzonatate (TESSALON PERLES) 100 MG capsule Take 1-2 capsules (100-200 mg total) by mouth 3 (three) times daily as needed for cough. 40 capsule 1   predniSONE (DELTASONE) 20 MG tablet Take 2 tablets (40 mg total) by mouth daily with breakfast. 10 tablet 0   promethazine-dextromethorphan (PROMETHAZINE-DM) 6.25-15 MG/5ML syrup Take 5-10 mLs by mouth every 4 (four) hours as needed.     simvastatin (ZOCOR) 20 MG tablet TAKE 1 TABLET BY MOUTH AT BEDTIME 90 tablet 0   No facility-administered medications prior to visit.    ROS: Review of Systems  Constitutional:  Negative for activity change, appetite change, chills, fatigue and unexpected weight change.  HENT:  Negative for congestion, mouth sores and sinus pressure.   Eyes:  Negative for visual disturbance.  Respiratory:  Negative for cough and chest tightness.   Gastrointestinal:  Negative for abdominal pain and nausea.  Genitourinary:  Negative for difficulty urinating, frequency and vaginal pain.  Musculoskeletal:  Positive for back pain and gait problem.  Negative for arthralgias and joint swelling.  Skin:  Negative for pallor and rash.  Neurological:  Negative for dizziness, tremors, weakness, numbness and headaches.  Psychiatric/Behavioral:  Negative for confusion and sleep disturbance.    Objective:  BP 120/80 (BP Location: Left Arm, Patient Position: Sitting, Cuff Size: Large)   Pulse (!) 53   Temp 98 F (36.7 C) (Oral)   Ht '5\' 3"'$  (1.6 m)   Wt 118 lb (53.5 kg)   SpO2 96%   BMI 20.90 kg/m   BP Readings from Last 3 Encounters:  05/10/22 120/80  03/22/22 126/72  02/03/22 134/66    Wt Readings from Last 3 Encounters:  05/10/22 118 lb (53.5 kg)  03/22/22 121 lb (54.9 kg)  02/03/22 121 lb 4 oz (55 kg)    Physical Exam Constitutional:      General: She is not in acute distress.    Appearance: Normal appearance. She is well-developed.  HENT:     Head: Normocephalic.     Right Ear: External ear normal.     Left Ear: External ear normal.     Nose: Nose normal.  Eyes:     General:        Right eye: No discharge.        Left eye: No discharge.     Conjunctiva/sclera: Conjunctivae normal.     Pupils: Pupils are equal, round, and reactive to  light.  Neck:     Thyroid: No thyromegaly.     Vascular: No JVD.     Trachea: No tracheal deviation.  Cardiovascular:     Rate and Rhythm: Normal rate and regular rhythm.     Heart sounds: Normal heart sounds.  Pulmonary:     Effort: No respiratory distress.     Breath sounds: No stridor. No wheezing.  Abdominal:     General: Bowel sounds are normal. There is no distension.     Palpations: Abdomen is soft. There is no mass.     Tenderness: There is no abdominal tenderness. There is no guarding or rebound.  Musculoskeletal:        General: No tenderness.     Cervical back: Normal range of motion and neck supple. No rigidity.  Lymphadenopathy:     Cervical: No cervical adenopathy.  Skin:    Findings: No erythema or rash.  Neurological:     Cranial Nerves: No cranial nerve  deficit.     Motor: No abnormal muscle tone.     Coordination: Coordination normal.     Deep Tendon Reflexes: Reflexes normal.  Psychiatric:        Behavior: Behavior normal.        Thought Content: Thought content normal.        Judgment: Judgment normal.   B hips w/pain  Lab Results  Component Value Date   WBC 5.9 11/04/2020   HGB 12.7 11/04/2020   HCT 38.0 11/04/2020   PLT 281.0 11/04/2020   GLUCOSE 97 11/06/2021   CHOL 147 11/06/2021   TRIG 79.0 11/06/2021   HDL 67.10 11/06/2021   LDLCALC 64 11/06/2021   ALT 11 11/06/2021   AST 18 11/06/2021   NA 135 11/06/2021   K 4.4 11/06/2021   CL 99 11/06/2021   CREATININE 0.61 11/06/2021   BUN 13 11/06/2021   CO2 30 11/06/2021   TSH 2.33 11/06/2021   INR 1.41 08/07/2015    MM 3D SCREEN BREAST BILATERAL  Result Date: 03/04/2022 CLINICAL DATA:  Screening. EXAM: DIGITAL SCREENING BILATERAL MAMMOGRAM WITH TOMOSYNTHESIS AND CAD TECHNIQUE: Bilateral screening digital craniocaudal and mediolateral oblique mammograms were obtained. Bilateral screening digital breast tomosynthesis was performed. The images were evaluated with computer-aided detection. COMPARISON:  Previous exam(s). ACR Breast Density Category b: There are scattered areas of fibroglandular density. FINDINGS: There are no findings suspicious for malignancy. IMPRESSION: No mammographic evidence of malignancy. A result letter of this screening mammogram will be mailed directly to the patient. RECOMMENDATION: Screening mammogram in one year. (Code:SM-B-01Y) BI-RADS CATEGORY  1: Negative. Electronically Signed   By: Ileana Roup M.D.   On: 03/04/2022 14:11    Assessment & Plan:   Problem List Items Addressed This Visit     Dyslipidemia   Relevant Medications   simvastatin (ZOCOR) 20 MG tablet   Leg weakness, bilateral    Started in 03/2022 Mild D/c Simvastatin for now      Low back pain    Started in 03/2022 Back/hip pain and weakness D/c Simvastatin for now RTC 3  wks      Trochanteric bursitis of both hips    Blue-Emu cream was recommended to use 2-3 times a day RTC 3 wks         Meds ordered this encounter  Medications   simvastatin (ZOCOR) 20 MG tablet    Sig: Take 1 tablet (20 mg total) by mouth at bedtime.    Dispense:  90 tablet  Refill:  3      Follow-up: Return in about 3 weeks (around 05/31/2022) for a follow-up visit.  Walker Kehr, MD

## 2022-05-10 NOTE — Assessment & Plan Note (Signed)
Started in 03/2022 Mild D/c Simvastatin for now

## 2022-05-31 ENCOUNTER — Ambulatory Visit: Payer: PPO | Admitting: Internal Medicine

## 2022-06-02 ENCOUNTER — Encounter: Payer: Self-pay | Admitting: Internal Medicine

## 2022-06-02 ENCOUNTER — Ambulatory Visit (INDEPENDENT_AMBULATORY_CARE_PROVIDER_SITE_OTHER): Payer: PPO | Admitting: Internal Medicine

## 2022-06-02 VITALS — BP 120/70 | HR 61 | Temp 97.7°F | Ht 63.0 in | Wt 120.0 lb

## 2022-06-02 DIAGNOSIS — R29898 Other symptoms and signs involving the musculoskeletal system: Secondary | ICD-10-CM | POA: Diagnosis not present

## 2022-06-02 DIAGNOSIS — M7062 Trochanteric bursitis, left hip: Secondary | ICD-10-CM | POA: Diagnosis not present

## 2022-06-02 DIAGNOSIS — R030 Elevated blood-pressure reading, without diagnosis of hypertension: Secondary | ICD-10-CM

## 2022-06-02 DIAGNOSIS — Z0000 Encounter for general adult medical examination without abnormal findings: Secondary | ICD-10-CM

## 2022-06-02 DIAGNOSIS — I2510 Atherosclerotic heart disease of native coronary artery without angina pectoris: Secondary | ICD-10-CM | POA: Diagnosis not present

## 2022-06-02 DIAGNOSIS — T466X5A Adverse effect of antihyperlipidemic and antiarteriosclerotic drugs, initial encounter: Secondary | ICD-10-CM

## 2022-06-02 DIAGNOSIS — G720 Drug-induced myopathy: Secondary | ICD-10-CM | POA: Insufficient documentation

## 2022-06-02 DIAGNOSIS — I2583 Coronary atherosclerosis due to lipid rich plaque: Secondary | ICD-10-CM

## 2022-06-02 DIAGNOSIS — M7061 Trochanteric bursitis, right hip: Secondary | ICD-10-CM | POA: Diagnosis not present

## 2022-06-02 DIAGNOSIS — I480 Paroxysmal atrial fibrillation: Secondary | ICD-10-CM

## 2022-06-02 MED ORDER — ASPIRIN 81 MG PO TBEC: 81.0000 mg | DELAYED_RELEASE_TABLET | Freq: Every day | ORAL | 12 refills | Status: AC

## 2022-06-02 MED ORDER — FISH OIL 1000 MG PO CAPS: 2.0000 | ORAL_CAPSULE | Freq: Every day | ORAL | 3 refills | Status: DC

## 2022-06-02 NOTE — Assessment & Plan Note (Signed)
BP Readings from Last 3 Encounters:  06/02/22 120/70  05/10/22 120/80  03/22/22 126/72

## 2022-06-02 NOTE — Assessment & Plan Note (Signed)
Cor calcium CT score is 149 Off statin Fish oil OK to start baby ASA/d

## 2022-06-02 NOTE — Assessment & Plan Note (Signed)
leg weakness - better off Simvastatin

## 2022-06-02 NOTE — Assessment & Plan Note (Signed)
No relapse Off Xarelto Not on ASA

## 2022-06-02 NOTE — Assessment & Plan Note (Signed)
Better on Blue-Emu cream

## 2022-06-02 NOTE — Progress Notes (Signed)
Subjective:  Patient ID: Vicki [name], female    DOB: 1946/08/04  Age: 76 y.o. MRN: [id number]  CC: No chief complaint on file.   HPI Vicki [name] presents for leg weakness - better off Simvastatin. F/u on dyslipidemia, h/o elevated CT calcium score  Outpatient Medications Prior to Visit  Medication Sig Dispense Refill   acetaminophen (TYLENOL) 325 MG tablet Take 500 mg by mouth every 6 (six) hours as needed for mild pain or fever.      cholecalciferol (VITAMIN D) 1000 UNITS tablet Take 1,000 Units by mouth 2 (two) times daily.     famotidine (PEPCID) 10 MG tablet Take 10 mg by mouth daily.     loratadine (CLARITIN) 10 MG tablet Take 10 mg by mouth daily.     Melatonin 3 MG TABS Take 3 mg by mouth at bedtime as needed (for sleep).      Multiple Vitamins-Minerals (ICAPS) TABS Take 1 tablet by mouth 2 (two) times daily.      triamcinolone ointment (KENALOG) 0.5 % Apply 1 application. topically 2 (two) times daily. Apply to hand rash 60 g 3   rivaroxaban (XARELTO) 20 MG TABS tablet Take 1 tablet every day by oral route.     simvastatin (ZOCOR) 20 MG tablet Take 1 tablet (20 mg total) by mouth at bedtime. 90 tablet 3   No facility-administered medications prior to visit.    ROS: Review of Systems  Constitutional:  Negative for activity change, appetite change, chills, fatigue and unexpected weight change.  HENT:  Negative for congestion, mouth sores and sinus pressure.   Eyes:  Negative for visual disturbance.  Respiratory:  Negative for cough and chest tightness.   Gastrointestinal:  Negative for abdominal pain and nausea.  Genitourinary:  Negative for difficulty urinating, frequency and vaginal pain.  Musculoskeletal:  Positive for arthralgias. Negative for back pain and gait problem.  Skin:  Negative for pallor and rash.  Neurological:  Negative for dizziness, tremors, weakness, numbness and headaches.  Psychiatric/Behavioral:  Negative for confusion and sleep  disturbance.     Objective:  BP 120/70 (BP Location: Right Arm, Patient Position: Sitting, Cuff Size: Normal)   Pulse 61   Temp 97.7 F (36.5 C) (Oral)   Ht '5\' 3"'$  (1.6 m)   Wt 120 lb (54.4 kg)   SpO2 92%   BMI 21.26 kg/m   BP Readings from Last 3 Encounters:  06/02/22 120/70  05/10/22 120/80  03/22/22 126/72    Wt Readings from Last 3 Encounters:  06/02/22 120 lb (54.4 kg)  05/10/22 118 lb (53.5 kg)  03/22/22 121 lb (54.9 kg)    Physical Exam Constitutional:      General: She is not in acute distress.    Appearance: She is well-developed.  HENT:     Head: Normocephalic.     Right Ear: External ear normal.     Left Ear: External ear normal.     Nose: Nose normal.  Eyes:     General:        Right eye: No discharge.        Left eye: No discharge.     Conjunctiva/sclera: Conjunctivae normal.     Pupils: Pupils are equal, round, and reactive to light.  Neck:     Thyroid: No thyromegaly.     Vascular: No JVD.     Trachea: No tracheal deviation.  Cardiovascular:     Rate and Rhythm: Normal rate and regular rhythm.  Heart sounds: Normal heart sounds.  Pulmonary:     Effort: No respiratory distress.     Breath sounds: No stridor. No wheezing.  Abdominal:     General: Bowel sounds are normal. There is no distension.     Palpations: Abdomen is soft. There is no mass.     Tenderness: There is no abdominal tenderness. There is no guarding or rebound.  Musculoskeletal:        General: No tenderness.     Cervical back: Normal range of motion and neck supple. No rigidity.  Lymphadenopathy:     Cervical: No cervical adenopathy.  Skin:    Findings: No erythema or rash.  Neurological:     Mental Status: She is oriented to person, place, and time.     Cranial Nerves: No cranial nerve deficit.     Motor: No abnormal muscle tone.     Coordination: Coordination normal.     Gait: Gait normal.     Deep Tendon Reflexes: Reflexes normal.  Psychiatric:        Behavior:  Behavior normal.        Thought Content: Thought content normal.        Judgment: Judgment normal.     Lab Results  Component Value Date   WBC 5.9 11/04/2020   HGB 12.7 11/04/2020   HCT 38.0 11/04/2020   PLT 281.0 11/04/2020   GLUCOSE 97 11/06/2021   CHOL 147 11/06/2021   TRIG 79.0 11/06/2021   HDL 67.10 11/06/2021   LDLCALC 64 11/06/2021   ALT 11 11/06/2021   AST 18 11/06/2021   NA 135 11/06/2021   K 4.4 11/06/2021   CL 99 11/06/2021   CREATININE 0.61 11/06/2021   BUN 13 11/06/2021   CO2 30 11/06/2021   TSH 2.33 11/06/2021   INR 1.41 08/07/2015    MM 3D SCREEN BREAST BILATERAL  Result Date: 03/04/2022 CLINICAL DATA:  Screening. EXAM: DIGITAL SCREENING BILATERAL MAMMOGRAM WITH TOMOSYNTHESIS AND CAD TECHNIQUE: Bilateral screening digital craniocaudal and mediolateral oblique mammograms were obtained. Bilateral screening digital breast tomosynthesis was performed. The images were evaluated with computer-aided detection. COMPARISON:  Previous exam(s). ACR Breast Density Category b: There are scattered areas of fibroglandular density. FINDINGS: There are no findings suspicious for malignancy. IMPRESSION: No mammographic evidence of malignancy. A result letter of this screening mammogram will be mailed directly to the patient. RECOMMENDATION: Screening mammogram in one year. (Code:SM-B-01Y) BI-RADS CATEGORY  1: Negative. Electronically Signed   By: Ileana Roup M.D.   On: 03/04/2022 14:11    Assessment & Plan:   Problem List Items Addressed This Visit     CAD (coronary artery disease)    Cor calcium CT score is 149 Off statin Fish oil OK to start baby ASA/d      Relevant Medications   aspirin EC 81 MG tablet   Elevated BP without diagnosis of hypertension    BP Readings from Last 3 Encounters:  06/02/22 120/70  05/10/22 120/80  03/22/22 126/72        Leg weakness, bilateral     leg weakness - better off Simvastatin      PAROXYSMAL ATRIAL FIBRILLATION    No  relapse Off Xarelto Not on ASA      Relevant Medications   aspirin EC 81 MG tablet   Other Relevant Orders   TSH   CBC with Differential/Platelet   Lipid panel   Statin myopathy     leg weakness - better off Simvastatin  Trochanteric bursitis of both hips    Better on Blue-Emu cream      Well adult exam - Primary   Relevant Orders   TSH   Urinalysis   CBC with Differential/Platelet   Lipid panel   Comprehensive metabolic panel      Meds ordered this encounter  Medications   aspirin EC 81 MG tablet    Sig: Take 1 tablet (81 mg total) by mouth daily. Swallow whole.    Dispense:  30 tablet    Refill:  12   Omega-3 Fatty Acids (FISH OIL) 1000 MG CAPS    Sig: Take 2 capsules (2,000 mg total) by mouth daily.    Dispense:  100 capsule    Refill:  3      Follow-up: Return in about 6 months (around 12/02/2022) for Wellness Exam.  Walker Kehr, MD

## 2022-06-22 ENCOUNTER — Encounter (INDEPENDENT_AMBULATORY_CARE_PROVIDER_SITE_OTHER): Payer: PPO | Admitting: Ophthalmology

## 2022-06-22 DIAGNOSIS — H35033 Hypertensive retinopathy, bilateral: Secondary | ICD-10-CM

## 2022-06-22 DIAGNOSIS — H353112 Nonexudative age-related macular degeneration, right eye, intermediate dry stage: Secondary | ICD-10-CM

## 2022-06-22 DIAGNOSIS — I10 Essential (primary) hypertension: Secondary | ICD-10-CM

## 2022-06-22 DIAGNOSIS — H43813 Vitreous degeneration, bilateral: Secondary | ICD-10-CM

## 2022-06-22 DIAGNOSIS — H353221 Exudative age-related macular degeneration, left eye, with active choroidal neovascularization: Secondary | ICD-10-CM | POA: Diagnosis not present

## 2022-08-17 ENCOUNTER — Encounter (INDEPENDENT_AMBULATORY_CARE_PROVIDER_SITE_OTHER): Payer: PPO | Admitting: Ophthalmology

## 2022-08-17 DIAGNOSIS — I10 Essential (primary) hypertension: Secondary | ICD-10-CM | POA: Diagnosis not present

## 2022-08-17 DIAGNOSIS — H353111 Nonexudative age-related macular degeneration, right eye, early dry stage: Secondary | ICD-10-CM

## 2022-08-17 DIAGNOSIS — H35033 Hypertensive retinopathy, bilateral: Secondary | ICD-10-CM | POA: Diagnosis not present

## 2022-08-17 DIAGNOSIS — H353221 Exudative age-related macular degeneration, left eye, with active choroidal neovascularization: Secondary | ICD-10-CM

## 2022-08-17 DIAGNOSIS — H43813 Vitreous degeneration, bilateral: Secondary | ICD-10-CM

## 2022-09-29 DIAGNOSIS — E785 Hyperlipidemia, unspecified: Secondary | ICD-10-CM | POA: Diagnosis not present

## 2022-09-29 DIAGNOSIS — K219 Gastro-esophageal reflux disease without esophagitis: Secondary | ICD-10-CM | POA: Diagnosis not present

## 2022-09-29 DIAGNOSIS — D689 Coagulation defect, unspecified: Secondary | ICD-10-CM | POA: Diagnosis not present

## 2022-09-29 DIAGNOSIS — E559 Vitamin D deficiency, unspecified: Secondary | ICD-10-CM | POA: Diagnosis not present

## 2022-09-29 DIAGNOSIS — Z87891 Personal history of nicotine dependence: Secondary | ICD-10-CM | POA: Diagnosis not present

## 2022-09-29 DIAGNOSIS — G4700 Insomnia, unspecified: Secondary | ICD-10-CM | POA: Diagnosis not present

## 2022-09-29 DIAGNOSIS — Z9849 Cataract extraction status, unspecified eye: Secondary | ICD-10-CM | POA: Diagnosis not present

## 2022-09-29 DIAGNOSIS — J309 Allergic rhinitis, unspecified: Secondary | ICD-10-CM | POA: Diagnosis not present

## 2022-09-29 DIAGNOSIS — I2510 Atherosclerotic heart disease of native coronary artery without angina pectoris: Secondary | ICD-10-CM | POA: Diagnosis not present

## 2022-09-29 DIAGNOSIS — I4891 Unspecified atrial fibrillation: Secondary | ICD-10-CM | POA: Diagnosis not present

## 2022-09-29 DIAGNOSIS — H353221 Exudative age-related macular degeneration, left eye, with active choroidal neovascularization: Secondary | ICD-10-CM | POA: Diagnosis not present

## 2022-09-29 DIAGNOSIS — Z7982 Long term (current) use of aspirin: Secondary | ICD-10-CM | POA: Diagnosis not present

## 2022-10-12 ENCOUNTER — Encounter (INDEPENDENT_AMBULATORY_CARE_PROVIDER_SITE_OTHER): Payer: PPO | Admitting: Ophthalmology

## 2022-10-12 DIAGNOSIS — H43813 Vitreous degeneration, bilateral: Secondary | ICD-10-CM | POA: Diagnosis not present

## 2022-10-12 DIAGNOSIS — I10 Essential (primary) hypertension: Secondary | ICD-10-CM

## 2022-10-12 DIAGNOSIS — H35033 Hypertensive retinopathy, bilateral: Secondary | ICD-10-CM

## 2022-10-12 DIAGNOSIS — H353112 Nonexudative age-related macular degeneration, right eye, intermediate dry stage: Secondary | ICD-10-CM

## 2022-10-12 DIAGNOSIS — H353221 Exudative age-related macular degeneration, left eye, with active choroidal neovascularization: Secondary | ICD-10-CM

## 2022-10-13 ENCOUNTER — Ambulatory Visit (INDEPENDENT_AMBULATORY_CARE_PROVIDER_SITE_OTHER): Payer: PPO

## 2022-10-13 VITALS — Ht 63.0 in

## 2022-10-13 DIAGNOSIS — Z0000 Encounter for general adult medical examination without abnormal findings: Secondary | ICD-10-CM

## 2022-10-13 NOTE — Patient Instructions (Signed)
[name] , Thank you for taking time to come for your Medicare Wellness Visit. I appreciate your ongoing commitment to your health goals. Please review the following plan we discussed and let me know if I can assist you in the future.   These are the goals we discussed:  Goals       Patient Stated (pt-stated)      My goal is to stay healthy, independent and active.        This is a list of the screening recommended for you and due dates:  Health Maintenance  Topic Date Due   Medicare Annual Wellness Visit  10/14/2023   Tetanus Vaccine  07/03/2025   Pneumonia Vaccine  Completed   Flu Shot  Completed   DEXA scan (bone density measurement)  Completed   Hepatitis C Screening: USPSTF Recommendation to screen - Ages 59-79 yo.  Completed   Zoster (Shingles) Vaccine  Completed   HPV Vaccine  Aged Out   Colon Cancer Screening  Discontinued   COVID-19 Vaccine  Discontinued    Advanced directives: No  Conditions/risks identified: Yes  Next appointment: Follow up in one year for your annual wellness visit.   Preventive Care 33 Years and Older, Female Preventive care refers to lifestyle choices and visits with your health care provider that can promote health and wellness. What does preventive care include? A yearly physical exam. This is also called an annual well check. Dental exams once or twice a year. Routine eye exams. Ask your health care provider how often you should have your eyes checked. Personal lifestyle choices, including: Daily care of your teeth and gums. Regular physical activity. Eating a healthy diet. Avoiding tobacco and drug use. Limiting alcohol use. Practicing safe sex. Taking low-dose aspirin every day. Taking vitamin and mineral supplements as recommended by your health care provider. What happens during an annual well check? The services and screenings done by your health care provider during your annual well check will depend on your age, overall  health, lifestyle risk factors, and family history of disease. Counseling  Your health care provider may ask you questions about your: Alcohol use. Tobacco use. Drug use. Emotional well-being. Home and relationship well-being. Sexual activity. Eating habits. History of falls. Memory and ability to understand (cognition). Work and work Statistician. Reproductive health. Screening  You may have the following tests or measurements: Height, weight, and BMI. Blood pressure. Lipid and cholesterol levels. These may be checked every 5 years, or more frequently if you are over 97 years old. Skin check. Lung cancer screening. You may have this screening every year starting at age 7 if you have a 30-pack-year history of smoking and currently smoke or have quit within the past 15 years. Fecal occult blood test (FOBT) of the stool. You may have this test every year starting at age 49. Flexible sigmoidoscopy or colonoscopy. You may have a sigmoidoscopy every 5 years or a colonoscopy every 10 years starting at age 65. Hepatitis C blood test. Hepatitis B blood test. Sexually transmitted disease (STD) testing. Diabetes screening. This is done by checking your blood sugar (glucose) after you have not eaten for a while (fasting). You may have this done every 1-3 years. Bone density scan. This is done to screen for osteoporosis. You may have this done starting at age 35. Mammogram. This may be done every 1-2 years. Talk to your health care provider about how often you should have regular mammograms. Talk with your health care provider about  your test results, treatment options, and if necessary, the need for more tests. Vaccines  Your health care provider may recommend certain vaccines, such as: Influenza vaccine. This is recommended every year. Tetanus, diphtheria, and acellular pertussis (Tdap, Td) vaccine. You may need a Td booster every 10 years. Zoster vaccine. You may need this after age  31. Pneumococcal 13-valent conjugate (PCV13) vaccine. One dose is recommended after age 45. Pneumococcal polysaccharide (PPSV23) vaccine. One dose is recommended after age 35. Talk to your health care provider about which screenings and vaccines you need and how often you need them. This information is not intended to replace advice given to you by your health care provider. Make sure you discuss any questions you have with your health care provider. Document Released: 12/19/2015 Document Revised: 08/11/2016 Document Reviewed: 09/23/2015 Elsevier Interactive Patient Education  2017 [place] Prevention in the Home Falls can cause injuries. They can happen to people of all ages. There are many things you can do to make your home safe and to help prevent falls. What can I do on the outside of my home? Regularly fix the edges of walkways and driveways and fix any cracks. Remove anything that might make you trip as you walk through a door, such as a raised step or threshold. Trim any bushes or trees on the path to your home. Use bright outdoor lighting. Clear any walking paths of anything that might make someone trip, such as rocks or tools. Regularly check to see if handrails are loose or broken. Make sure that both sides of any steps have handrails. Any raised decks and porches should have guardrails on the edges. Have any leaves, snow, or ice cleared regularly. Use sand or salt on walking paths during winter. Clean up any spills in your garage right away. This includes oil or grease spills. What can I do in the bathroom? Use night lights. Install grab bars by the toilet and in the tub and shower. Do not use towel bars as grab bars. Use non-skid mats or decals in the tub or shower. If you need to sit down in the shower, use a plastic, non-slip stool. Keep the floor dry. Clean up any water that spills on the floor as soon as it happens. Remove soap buildup in the tub or shower  regularly. Attach bath mats securely with double-sided non-slip rug tape. Do not have throw rugs and other things on the floor that can make you trip. What can I do in the bedroom? Use night lights. Make sure that you have a light by your bed that is easy to reach. Do not use any sheets or blankets that are too big for your bed. They should not hang down onto the floor. Have a firm chair that has side arms. You can use this for support while you get dressed. Do not have throw rugs and other things on the floor that can make you trip. What can I do in the kitchen? Clean up any spills right away. Avoid walking on wet floors. Keep items that you use a lot in easy-to-reach places. If you need to reach something above you, use a strong step stool that has a grab bar. Keep electrical cords out of the way. Do not use floor polish or wax that makes floors slippery. If you must use wax, use non-skid floor wax. Do not have throw rugs and other things on the floor that can make you trip. What can I do with  my stairs? Do not leave any items on the stairs. Make sure that there are handrails on both sides of the stairs and use them. Fix handrails that are broken or loose. Make sure that handrails are as long as the stairways. Check any carpeting to make sure that it is firmly attached to the stairs. Fix any carpet that is loose or worn. Avoid having throw rugs at the top or bottom of the stairs. If you do have throw rugs, attach them to the floor with carpet tape. Make sure that you have a light switch at the top of the stairs and the bottom of the stairs. If you do not have them, ask someone to add them for you. What else can I do to help prevent falls? Wear shoes that: Do not have high heels. Have rubber bottoms. Are comfortable and fit you well. Are closed at the toe. Do not wear sandals. If you use a stepladder: Make sure that it is fully opened. Do not climb a closed stepladder. Make sure that  both sides of the stepladder are locked into place. Ask someone to hold it for you, if possible. Clearly mark and make sure that you can see: Any grab bars or handrails. First and last steps. Where the edge of each step is. Use tools that help you move around (mobility aids) if they are needed. These include: Canes. Walkers. Scooters. Crutches. Turn on the lights when you go into a dark area. Replace any light bulbs as soon as they burn out. Set up your furniture so you have a clear path. Avoid moving your furniture around. If any of your floors are uneven, fix them. If there are any pets around you, be aware of where they are. Review your medicines with your doctor. Some medicines can make you feel dizzy. This can increase your chance of falling. Ask your doctor what other things that you can do to help prevent falls. This information is not intended to replace advice given to you by your health care provider. Make sure you discuss any questions you have with your health care provider. Document Released: 09/18/2009 Document Revised: 04/29/2016 Document Reviewed: 12/27/2014 Elsevier Interactive Patient Education  2017 [employer].

## 2022-10-13 NOTE — Progress Notes (Addendum)
Virtual Visit via Telephone Note  I connected with  Vicki [name] on 10/13/22 at 10:30 AM EST by telephone and verified that I am speaking with the correct person using two identifiers.  Location: Patient: Home Provider: [facility] Persons participating in the virtual visit: [place]   I discussed the limitations, risks, security and privacy concerns of performing an evaluation and management service by telephone and the availability of in person appointments. The patient expressed understanding and agreed to proceed.  Interactive audio and video telecommunications were attempted between this nurse and patient, however failed, due to patient having technical difficulties OR patient did not have access to video capability.  We continued and completed visit with audio only.  Some vital signs may be absent or patient reported.   Sheral Flow, LPN  Subjective:   Vicki [name] is a 76 y.o. female who presents for Medicare Annual (Subsequent) preventive examination.  Review of Systems     Cardiac Risk Factors include: advanced age (>4mn, >>37women);dyslipidemia;family history of premature cardiovascular disease     Objective:    Today's Vitals   10/13/22 1034  Height: '5\' 3"'$  (1.6 m)  PainSc: 0-No pain   Body mass index is 21.26 kg/m.     10/13/2022   10:37 AM 10/12/2021    1:49 PM 06/27/2020    9:20 AM 01/14/2020    4:50 PM 01/14/2020    4:49 AM 01/11/2020   11:24 AM 01/17/2019    8:57 AM  Advanced Directives  Does Patient Have a Medical Advance Directive? No No Yes No No No No  Does patient want to make changes to medical advance directive?   No - Patient declined      Would patient like information on creating a medical advance directive? No - Patient declined No - Patient declined  No - Patient declined   No - Patient declined    Current Medications (verified) Outpatient Encounter Medications as of 10/13/2022  Medication Sig    acetaminophen (TYLENOL) 325 MG tablet Take 500 mg by mouth every 6 (six) hours as needed for mild pain or fever.    aspirin EC 81 MG tablet Take 1 tablet (81 mg total) by mouth daily. Swallow whole.   cholecalciferol (VITAMIN D) 1000 UNITS tablet Take 1,000 Units by mouth 2 (two) times daily.   famotidine (PEPCID) 10 MG tablet Take 10 mg by mouth daily.   loratadine (CLARITIN) 10 MG tablet Take 10 mg by mouth daily.   Melatonin 3 MG TABS Take 3 mg by mouth at bedtime as needed (for sleep).    Multiple Vitamins-Minerals (ICAPS) TABS Take 1 tablet by mouth 2 (two) times daily.    Omega-3 Fatty Acids (FISH OIL) 1000 MG CAPS Take 2 capsules (2,000 mg total) by mouth daily.   triamcinolone ointment (KENALOG) 0.5 % Apply 1 application. topically 2 (two) times daily. Apply to hand rash   No facility-administered encounter medications on file as of 10/13/2022.    Allergies (verified) Codeine and Simvastatin   History: Past Medical History:  Diagnosis Date   Allergic rhinitis    Anxiety    Aortic insufficiency    Breast cancer ([facility] 2008   Left Breast Cancer   Contact dermatitis    Diverticulosis of colon    DJD (degenerative joint disease)    Dysrhythmia    A-fib   GERD (gastroesophageal reflux disease)    Hx of colonic polyps    Hyperlipidemia    Macular degeneration,  bilateral    Osteopenia    Paroxysmal atrial fibrillation (HCC)    A. fib is now persistent.   Personal history of radiation therapy 2008   Left Breast Cancer   Visit for monitoring Tikosyn therapy 09/06/2015   Past Surgical History:  Procedure Laterality Date   ABDOMINAL HYSTERECTOMY  1984   with 1 ovary removal   BREAST LUMPECTOMY Left 08/2007    with node bx   ELECTROPHYSIOLOGIC STUDY N/A 05/07/2016   Procedure: Atrial Fibrillation Ablation;  Surgeon: Will Meredith Leeds, MD;  Location: [facility];  Service: Cardiovascular;  Laterality: N/A;   Family History  Problem Relation Age of Onset   Heart  disease Father 71   Heart attack Father    Other Mother 92       AVR   Heart disease Mother    Clotting disorder Mother 49   Breast cancer Sister    Lymphoma Sister    Hypertension Sister    Healthy Brother    Other Maternal Grandmother    Colon cancer Neg Hx    Esophageal cancer Neg Hx    Pancreatic cancer Neg Hx    Social History   Socioeconomic History   Marital status: Married    Spouse name: Alohilani Levenhagen   Number of children: 3   Years of education: Not on file   Highest education level: Not on file  Occupational History   Occupation: ACCOUNTING    Employer: [employer]  Tobacco Use   Smoking status: Former    Types: Cigarettes    Quit date: 12/06/1976    Years since quitting: 45.8   Smokeless tobacco: Never   Tobacco comments:    smoked approx 10 years  Vaping Use   Vaping Use: Never used  Substance and Sexual Activity   Alcohol use: No    Alcohol/week: 0.0 standard drinks of alcohol   Drug use: No   Sexual activity: Not Currently  Other Topics Concern   Not on file  Social History Narrative   Not on file   Social Determinants of Health   Financial Resource Strain: Low Risk  (10/13/2022)   Overall Financial Resource Strain (CARDIA)    Difficulty of Paying Living Expenses: Not hard at all  Food Insecurity: No Food Insecurity (10/13/2022)   Hunger Vital Sign    Worried About Running Out of Food in the Last Year: Never true    Ran Out of Food in the Last Year: Never true  Transportation Needs: No Transportation Needs (10/13/2022)   PRAPARE - Hydrologist (Medical): No    Lack of Transportation (Non-Medical): No  Physical Activity: Sufficiently Active (10/13/2022)   Exercise Vital Sign    Days of Exercise per Week: 5 days    Minutes of Exercise per Session: 30 min  Stress: No Stress Concern Present (10/13/2022)   [place]    Feeling of Stress : Not at all   Social Connections: [place] (10/13/2022)   Social Connection and Isolation Panel [NHANES]    Frequency of Communication with Friends and Family: More than three times a week    Frequency of Social Gatherings with Friends and Family: More than three times a week    Attends Religious Services: More than 4 times per year    Active Member of [employer] or Organizations: Yes    Attends Archivist Meetings: More than 4 times per year  Marital Status: Married    Tobacco Counseling Counseling given: Not Answered Tobacco comments: smoked approx 10 years   Clinical Intake:  Pre-visit preparation completed: Yes  Pain : No/denies pain Pain Score: 0-No pain     BMI - recorded: 21.26 (06/02/2022) Nutritional Status: BMI of 19-24  Normal Nutritional Risks: None Diabetes: No  How often do you need to have someone help you when you read instructions, pamphlets, or other written materials from your doctor or pharmacy?: 1 - Never What is the last grade level you completed in school?: HSG  Diabetic? No  Interpreter Needed?: No  Information entered by :: Lisette Abu, LPN.   Activities of Daily Living    10/13/2022   10:48 AM  In your present state of health, do you have any difficulty performing the following activities:  Hearing? 0  Vision? 0  Difficulty concentrating or making decisions? 0  Walking or climbing stairs? 0  Dressing or bathing? 0  Doing errands, shopping? 0  Preparing Food and eating ? N  Using the Toilet? N  In the past six months, have you accidently leaked urine? N  Do you have problems with loss of bowel control? N  Managing your Medications? N  Managing your Finances? N  Housekeeping or managing your Housekeeping? N    Patient Care Team: Plotnikov, Evie Lacks, MD as PCP - General (Internal Medicine) Constance Haw, MD as PCP - Electrophysiology (Cardiology) Lorretta Harp, MD as Consulting Physician (Cardiology) Monna Fam, MD as Consulting Physician (Ophthalmology) Hayden Pedro, MD as Consulting Physician (Ophthalmology)  Indicate any recent Medical Services you may have received from other than [facility] providers in the past year (date may be approximate).     Assessment:   This is a routine wellness examination for Stacie.  Hearing/Vision screen Hearing Screening - Comments:: Denies hearing difficulties   Vision Screening - Comments:: Wears rx glasses - up to date with routine eye exams with Kandy Garrison, MD and Tempie Hoist, MD.   Dietary issues and exercise activities discussed: Current Exercise Habits: Home exercise routine, Type of exercise: walking, Time (Minutes): 30, Frequency (Times/Week): 5, Weekly Exercise (Minutes/Week): 150, Intensity: Moderate, Exercise limited by: orthopedic condition(s)   Goals Addressed   None   Depression Screen    10/13/2022   10:40 AM 02/03/2022    2:55 PM 10/12/2021    1:54 PM 11/04/2020    7:57 AM 06/27/2020    9:20 AM 02/05/2020    8:51 AM 01/17/2019    8:21 AM  PHQ 2/9 Scores  PHQ - 2 Score 0 0 0 0 0 0 0    Fall Risk    10/13/2022   10:38 AM 02/03/2022    2:55 PM 10/12/2021    1:50 PM 11/04/2020    7:57 AM 06/27/2020    9:20 AM  [place] in the past year? 0 0 0 0 0  Number falls in past yr: 0  0 0 0  Injury with Fall? 0  0 0 0  Risk for fall due to : No Fall Risks  No Fall Risks No Fall Risks No Fall Risks  Follow up Falls prevention discussed  Falls evaluation completed Falls evaluation completed Falls evaluation completed    [place]:  Any stairs in or around the home? No  If so, are there any without handrails? No  Home free of loose throw rugs in walkways, pet beds, electrical cords, etc?  Yes  Adequate lighting in your home to reduce risk of falls? Yes   ASSISTIVE DEVICES UTILIZED TO PREVENT FALLS:  Life alert? No  Use of a cane, walker or w/c? No  Grab bars in the bathroom? No  Shower  chair or bench in shower? Yes  Elevated toilet seat or a handicapped toilet? No   TIMED UP AND GO:  Was the test performed? No . Phone Visit  Cognitive Function:    01/16/2018    8:38 AM  MMSE - Mini Mental State Exam  Orientation to time 5  Orientation to Place 5  Registration 3  Attention/ Calculation 5  Recall 3  Language- name 2 objects 2  Language- repeat 1  Language- follow 3 step command 3  Language- read & follow direction 1  Write a sentence 1  Copy design 1  Total score 30        10/13/2022   10:49 AM  6CIT Screen  What Year? 0 points  What month? 0 points  What time? 0 points  Count back from 20 0 points  Months in reverse 0 points  Repeat phrase 0 points  Total Score 0 points    Immunizations Immunization History  Administered Date(s) Administered   Fluad Quad(high Dose 65+) 09/21/2020, 09/04/2022   Influenza Split 09/17/2011, 09/21/2012   Influenza Whole 09/07/2010, 09/19/2016   Influenza, High Dose Seasonal PF 08/30/2017, 08/30/2018, 09/05/2019, 08/27/2021   Influenza-Unspecified 09/05/2014, 09/15/2015   PFIZER(Purple Top)SARS-COV-2 Vaccination 03/20/2020, 04/14/2020   PNEUMOCOCCAL CONJUGATE-20 08/27/2021   Pneumococcal Conjugate-13 07/04/2015   Pneumococcal Polysaccharide-23 03/19/2014, 05/05/2021   Td 07/04/2015   Zoster Recombinat (Shingrix) 08/09/2019, 11/06/2019   Zoster, Live 09/28/2016    TDAP status: Up to date  Flu Vaccine status: Up to date  Pneumococcal vaccine status: Up to date  Covid-19 vaccine status: Completed vaccines  Qualifies for Shingles Vaccine? Yes   Zostavax completed Yes   Shingrix Completed?: Yes  Screening Tests Health Maintenance  Topic Date Due   Medicare Annual Wellness (AWV)  10/14/2023   TETANUS/TDAP  07/03/2025   Pneumonia Vaccine 11+ Years old  Completed   INFLUENZA VACCINE  Completed   DEXA SCAN  Completed   Hepatitis C Screening  Completed   Zoster Vaccines- Shingrix  Completed   HPV VACCINES   Aged Out   COLONOSCOPY (Pts 45-75yr Insurance coverage will need to be confirmed)  Discontinued   COVID-19 Vaccine  Discontinued    Health Maintenance  There are no preventive care reminders to display for this patient.   Colorectal cancer screening: No longer required.   Mammogram status: Completed 03/04/2022. Repeat every year  Bone Density status; patient declined  Lung Cancer Screening: (Low Dose CT Chest recommended if Age 76-80years, 30 pack-year currently smoking OR have quit w/in 15years.) does not qualify.   Lung Cancer Screening Referral: no  Additional Screening:  Hepatitis C Screening: does qualify; Completed 01/06/2017  Vision Screening: Recommended annual ophthalmology exams for early detection of glaucoma and other disorders of the eye. Is the patient up to date with their annual eye exam?  Yes  Who is the provider or what is the name of the office in which the patient attends annual eye exams? KMonna Fam MD and JTempie Hoist MD If pt is not established with a provider, would they like to be referred to a provider to establish care? No .   Dental Screening: Recommended annual dental exams for proper oral hygiene  Community Resource Referral / Chronic Care  Management: CRR required this visit?  No   CCM required this visit?  No      Plan:     I have personally reviewed and noted the following in the patient's chart:   Medical and social history Use of alcohol, tobacco or illicit drugs  Current medications and supplements including opioid prescriptions. Patient is not currently taking opioid prescriptions. Functional ability and status Nutritional status Physical activity Advanced directives List of other physicians Hospitalizations, surgeries, and ER visits in previous 12 months Vitals Screenings to include cognitive, depression, and falls Referrals and appointments  In addition, I have reviewed and discussed with patient certain preventive  protocols, quality metrics, and best practice recommendations. A written personalized care plan for preventive services as well as general preventive health recommendations were provided to patient.     Sheral Flow, LPN   72/07/2059   Nurse Notes: N/A   Medical screening examination/treatment/procedure(s) were performed by non-physician practitioner and as supervising physician I was immediately available for consultation/collaboration.  I agree with above. Lew Dawes, MD

## 2022-12-01 DIAGNOSIS — R509 Fever, unspecified: Secondary | ICD-10-CM | POA: Diagnosis not present

## 2022-12-01 DIAGNOSIS — R519 Headache, unspecified: Secondary | ICD-10-CM | POA: Diagnosis not present

## 2022-12-01 DIAGNOSIS — M791 Myalgia, unspecified site: Secondary | ICD-10-CM | POA: Diagnosis not present

## 2022-12-01 DIAGNOSIS — B349 Viral infection, unspecified: Secondary | ICD-10-CM | POA: Diagnosis not present

## 2022-12-07 ENCOUNTER — Ambulatory Visit (INDEPENDENT_AMBULATORY_CARE_PROVIDER_SITE_OTHER): Payer: Medicare HMO | Admitting: Internal Medicine

## 2022-12-07 ENCOUNTER — Encounter: Payer: Self-pay | Admitting: Internal Medicine

## 2022-12-07 VITALS — BP 130/78 | HR 75 | Temp 98.0°F | Ht 63.0 in | Wt 114.0 lb

## 2022-12-07 DIAGNOSIS — Z0000 Encounter for general adult medical examination without abnormal findings: Secondary | ICD-10-CM

## 2022-12-07 LAB — CBC WITH DIFFERENTIAL/PLATELET
Basophils Absolute: 0 10*3/uL (ref 0.0–0.1)
Basophils Relative: 0.6 % (ref 0.0–3.0)
Eosinophils Absolute: 0 10*3/uL (ref 0.0–0.7)
Eosinophils Relative: 0 % (ref 0.0–5.0)
HCT: 36.7 % (ref 36.0–46.0)
Hemoglobin: 12.6 g/dL (ref 12.0–15.0)
Lymphocytes Relative: 32.1 % (ref 12.0–46.0)
Lymphs Abs: 1.4 10*3/uL (ref 0.7–4.0)
MCHC: 34.2 g/dL (ref 30.0–36.0)
MCV: 95.1 fl (ref 78.0–100.0)
Monocytes Absolute: 0.6 10*3/uL (ref 0.1–1.0)
Monocytes Relative: 14.6 % — ABNORMAL HIGH (ref 3.0–12.0)
Neutro Abs: 2.3 10*3/uL (ref 1.4–7.7)
Neutrophils Relative %: 52.7 % (ref 43.0–77.0)
Platelets: 256 10*3/uL (ref 150.0–400.0)
RBC: 3.86 Mil/uL — ABNORMAL LOW (ref 3.87–5.11)
RDW: 13 % (ref 11.5–15.5)
WBC: 4.3 10*3/uL (ref 4.0–10.5)

## 2022-12-07 LAB — LIPID PANEL
Cholesterol: 180 mg/dL (ref 0–200)
HDL: 47.3 mg/dL (ref >39.00)
LDL Cholesterol: 109 mg/dL — ABNORMAL HIGH (ref 0–99)
NonHDL: 132.57
Total CHOL/HDL Ratio: 4
Triglycerides: 116 mg/dL (ref 0.0–149.0)
VLDL: 23.2 mg/dL (ref 0.0–40.0)

## 2022-12-07 LAB — TSH: TSH: 2.51 u[IU]/mL (ref 0.35–5.50)

## 2022-12-07 LAB — COMPREHENSIVE METABOLIC PANEL
ALT: 23 U/L (ref 0–35)
AST: 25 U/L (ref 0–37)
Albumin: 3.8 g/dL (ref 3.5–5.2)
Alkaline Phosphatase: 81 U/L (ref 39–117)
BUN: 9 mg/dL (ref 6–23)
CO2: 30 mEq/L (ref 19–32)
Calcium: 9 mg/dL (ref 8.4–10.5)
Chloride: 101 mEq/L (ref 96–112)
Creatinine, Ser: 0.5 mg/dL (ref 0.40–1.20)
GFR: 91.22 mL/min (ref >60.00)
Glucose, Bld: 97 mg/dL (ref 70–99)
Potassium: 4 mEq/L (ref 3.5–5.1)
Sodium: 139 mEq/L (ref 135–145)
Total Bilirubin: 0.5 mg/dL (ref 0.2–1.2)
Total Protein: 6.4 g/dL (ref 6.0–8.3)

## 2022-12-07 LAB — URINALYSIS
Bilirubin Urine: NEGATIVE
Hgb urine dipstick: NEGATIVE
Ketones, ur: NEGATIVE
Leukocytes,Ua: NEGATIVE
Nitrite: NEGATIVE
Specific Gravity, Urine: 1.01 (ref 1.000–1.030)
Total Protein, Urine: NEGATIVE
Urine Glucose: NEGATIVE
Urobilinogen, UA: 0.2 (ref 0.0–1.0)
pH: 7 (ref 5.0–8.0)

## 2022-12-07 NOTE — Progress Notes (Signed)
Subjective:  Patient ID: Vicki [name], female    DOB: 04-06-1946  Age: 77 y.o. MRN: [id number]  CC: Follow-up (Wanted to know if she should get RSV shot )   HPI Vicki [name] presents for a well exam Pt had a URI - COVID (-) 1 week ago  Outpatient Medications Prior to Visit  Medication Sig Dispense Refill   acetaminophen (TYLENOL) 325 MG tablet Take 500 mg by mouth every 6 (six) hours as needed for mild pain or fever.      aspirin EC 81 MG tablet Take 1 tablet (81 mg total) by mouth daily. Swallow whole. 30 tablet 12   cholecalciferol (VITAMIN D) 1000 UNITS tablet Take 1,000 Units by mouth 2 (two) times daily.     FLUZONE HIGH-DOSE QUADRIVALENT 0.7 ML SUSY      Melatonin 3 MG TABS Take 3 mg by mouth at bedtime as needed (for sleep).      Multiple Vitamins-Minerals (ICAPS) TABS Take 1 tablet by mouth 2 (two) times daily.      Omega-3 Fatty Acids (FISH OIL) 1000 MG CAPS Take 2 capsules (2,000 mg total) by mouth daily. 100 capsule 3   triamcinolone ointment (KENALOG) 0.5 % Apply 1 application. topically 2 (two) times daily. Apply to hand rash 60 g 3   famotidine (PEPCID) 10 MG tablet Take 10 mg by mouth daily. (Patient not taking: Reported on 12/07/2022)     loratadine (CLARITIN) 10 MG tablet Take 10 mg by mouth daily. (Patient not taking: Reported on 12/07/2022)     No facility-administered medications prior to visit.    ROS: Review of Systems  Constitutional:  Negative for activity change, appetite change, chills, fatigue and unexpected weight change.  HENT:  Negative for congestion, mouth sores and sinus pressure.   Eyes:  Negative for visual disturbance.  Respiratory:  Negative for cough and chest tightness.   Gastrointestinal:  Negative for abdominal pain and nausea.  Genitourinary:  Negative for difficulty urinating, frequency and vaginal pain.  Musculoskeletal:  Negative for back pain and gait problem.  Skin:  Negative for pallor and rash.  Neurological:  Negative for  dizziness, tremors, weakness, numbness and headaches.  Psychiatric/Behavioral:  Negative for confusion and sleep disturbance.     Objective:  BP 130/78 (BP Location: Right Arm, Patient Position: Sitting, Cuff Size: Normal)   Pulse 75   Temp 98 F (36.7 C) (Oral)   Ht '5\' 3"'$  (1.6 m)   Wt 114 lb (51.7 kg)   SpO2 98%   BMI 20.19 kg/m   BP Readings from Last 3 Encounters:  12/07/22 130/78  06/02/22 120/70  05/10/22 120/80    Wt Readings from Last 3 Encounters:  12/07/22 114 lb (51.7 kg)  06/02/22 120 lb (54.4 kg)  05/10/22 118 lb (53.5 kg)    Physical Exam Constitutional:      General: She is not in acute distress.    Appearance: Normal appearance. She is well-developed.  HENT:     Head: Normocephalic.     Right Ear: External ear normal.     Left Ear: External ear normal.     Nose: Nose normal.  Eyes:     General:        Right eye: No discharge.        Left eye: No discharge.     Conjunctiva/sclera: Conjunctivae normal.     Pupils: Pupils are equal, round, and reactive to light.  Neck:     Thyroid: No thyromegaly.  Vascular: No JVD.     Trachea: No tracheal deviation.  Cardiovascular:     Rate and Rhythm: Normal rate and regular rhythm.     Heart sounds: Normal heart sounds.  Pulmonary:     Effort: No respiratory distress.     Breath sounds: No stridor. No wheezing.  Abdominal:     General: Bowel sounds are normal. There is no distension.     Palpations: Abdomen is soft. There is no mass.     Tenderness: There is no abdominal tenderness. There is no guarding or rebound.  Musculoskeletal:        General: No tenderness.     Cervical back: Normal range of motion and neck supple. No rigidity.  Lymphadenopathy:     Cervical: No cervical adenopathy.  Skin:    Findings: No erythema or rash.  Neurological:     Cranial Nerves: No cranial nerve deficit.     Motor: No abnormal muscle tone.     Coordination: Coordination normal.     Deep Tendon Reflexes: Reflexes  normal.  Psychiatric:        Behavior: Behavior normal.        Thought Content: Thought content normal.        Judgment: Judgment normal.     Lab Results  Component Value Date   WBC 5.9 11/04/2020   HGB 12.7 11/04/2020   HCT 38.0 11/04/2020   PLT 281.0 11/04/2020   GLUCOSE 97 11/06/2021   CHOL 147 11/06/2021   TRIG 79.0 11/06/2021   HDL 67.10 11/06/2021   LDLCALC 64 11/06/2021   ALT 11 11/06/2021   AST 18 11/06/2021   NA 135 11/06/2021   K 4.4 11/06/2021   CL 99 11/06/2021   CREATININE 0.61 11/06/2021   BUN 13 11/06/2021   CO2 30 11/06/2021   TSH 2.33 11/06/2021   INR 1.41 08/07/2015    MM 3D SCREEN BREAST BILATERAL  Result Date: 03/04/2022 CLINICAL DATA:  Screening. EXAM: DIGITAL SCREENING BILATERAL MAMMOGRAM WITH TOMOSYNTHESIS AND CAD TECHNIQUE: Bilateral screening digital craniocaudal and mediolateral oblique mammograms were obtained. Bilateral screening digital breast tomosynthesis was performed. The images were evaluated with computer-aided detection. COMPARISON:  Previous exam(s). ACR Breast Density Category b: There are scattered areas of fibroglandular density. FINDINGS: There are no findings suspicious for malignancy. IMPRESSION: No mammographic evidence of malignancy. A result letter of this screening mammogram will be mailed directly to the patient. RECOMMENDATION: Screening mammogram in one year. (Code:SM-B-01Y) BI-RADS CATEGORY  1: Negative. Electronically Signed   By: Ileana Roup M.D.   On: 03/04/2022 14:11    Assessment & Plan:   Problem List Items Addressed This Visit     Well adult exam - Primary     We discussed age appropriate health related issues, including available/recomended screening tests and vaccinations. Labs were ordered to be later reviewed . All questions were answered. We discussed one or more of the following - seat belt use, use of sunscreen/sun exposure exercise, fall risk reduction, second hand smoke exposure, firearm use and storage,  seat belt use, a need for adhering to healthy diet and exercise. Labs were ordered.  All questions were answered. Last colon - 2020; no need for another one (Dr Loletha Carrow) RSV vaccine discussed      Relevant Orders   TSH   Urinalysis   CBC with Differential/Platelet   Lipid panel   Comprehensive metabolic panel      No orders of the defined types were placed in this encounter.  Follow-up: Return in about 1 year (around 12/08/2023) for Wellness Exam.  Walker Kehr, MD

## 2022-12-07 NOTE — Assessment & Plan Note (Signed)
  We discussed age appropriate health related issues, including available/recomended screening tests and vaccinations. Labs were ordered to be later reviewed . All questions were answered. We discussed one or more of the following - seat belt use, use of sunscreen/sun exposure exercise, fall risk reduction, second hand smoke exposure, firearm use and storage, seat belt use, a need for adhering to healthy diet and exercise. Labs were ordered.  All questions were answered. Last colon - 2020; no need for another one (Dr Loletha Carrow) RSV vaccine discussed

## 2022-12-28 ENCOUNTER — Encounter (INDEPENDENT_AMBULATORY_CARE_PROVIDER_SITE_OTHER): Payer: Medicare HMO | Admitting: Ophthalmology

## 2022-12-28 DIAGNOSIS — I10 Essential (primary) hypertension: Secondary | ICD-10-CM

## 2022-12-28 DIAGNOSIS — H26492 Other secondary cataract, left eye: Secondary | ICD-10-CM

## 2022-12-28 DIAGNOSIS — H353221 Exudative age-related macular degeneration, left eye, with active choroidal neovascularization: Secondary | ICD-10-CM

## 2022-12-28 DIAGNOSIS — H43813 Vitreous degeneration, bilateral: Secondary | ICD-10-CM

## 2022-12-28 DIAGNOSIS — H353112 Nonexudative age-related macular degeneration, right eye, intermediate dry stage: Secondary | ICD-10-CM | POA: Diagnosis not present

## 2022-12-28 DIAGNOSIS — H35033 Hypertensive retinopathy, bilateral: Secondary | ICD-10-CM | POA: Diagnosis not present

## 2023-01-14 ENCOUNTER — Encounter: Payer: Self-pay | Admitting: Cardiology

## 2023-01-14 ENCOUNTER — Ambulatory Visit: Payer: Medicare HMO | Attending: Cardiology | Admitting: Cardiology

## 2023-01-14 VITALS — BP 130/86 | HR 59 | Ht 63.0 in | Wt 115.0 lb

## 2023-01-14 DIAGNOSIS — I4819 Other persistent atrial fibrillation: Secondary | ICD-10-CM | POA: Diagnosis not present

## 2023-01-14 NOTE — Patient Instructions (Signed)
Medication Instructions:  Your physician recommends that you continue on your current medications as directed. Please refer to the Current Medication list given to you today.  *If you need a refill on your cardiac medications before your next appointment, please call your pharmacy*   Lab Work: None ordered   Testing/Procedures: None ordered   Follow-Up: At [facility], you and your health needs are our priority.  As part of our continuing mission to provide you with exceptional heart care, we have created designated Provider Care Teams.  These Care Teams include your primary Cardiologist (physician) and Advanced Practice Providers (APPs -  Physician Assistants and Nurse Practitioners) who all work together to provide you with the care you need, when you need it.  Your next appointment:   2 year(s)  The format for your next appointment:   In Person  Provider:   Allegra Lai, MD    Thank you for choosing [facility]!!   Trinidad Curet, RN [phone]

## 2023-01-14 NOTE — Progress Notes (Signed)
Electrophysiology Office Note   Date:  01/14/2023   ID:  Vicki, [name] 1946-10-28, MRN [id number]  PCP:  Vicki Anger, MD  Cardiologist:  Quay Burow Primary Electrophysiologist: Constance Haw, MD    No chief complaint on file.     History of Present Illness: Vicki [name] is a 77 y.o. female who presents today for electrophysiology evaluation.     She has a history significant for persistent atrial fibrillation, hyperlipidemia, aortic insufficiency.   CHA2DS2-VASc of 3.  She is post ablation 05/07/2016.  Fortunately she has had no further episodes of atrial fibrillation and Xarelto has since been stopped.  Today, denies symptoms of palpitations, chest pain, shortness of breath, orthopnea, PND, lower extremity edema, claudication, dizziness, presyncope, syncope, bleeding, or neurologic sequela. The patient is tolerating medications without difficulties.  Since being seen she has done well.  She has noted no further episodes of atrial fibrillation.  Happy with her control.   Past Medical History:  Diagnosis Date   Allergic rhinitis    Anxiety    Aortic insufficiency    Breast cancer ([facility]) 2008   Left Breast Cancer   Contact dermatitis    Diverticulosis of colon    DJD (degenerative joint disease)    Dysrhythmia    A-fib   GERD (gastroesophageal reflux disease)    Hx of colonic polyps    Hyperlipidemia    Macular degeneration, bilateral    Osteopenia    Paroxysmal atrial fibrillation (HCC)    A. fib is now persistent.   Personal history of radiation therapy 2008   Left Breast Cancer   Visit for monitoring Tikosyn therapy 09/06/2015   Past Surgical History:  Procedure Laterality Date   ABDOMINAL HYSTERECTOMY  1984   with 1 ovary removal   BREAST LUMPECTOMY Left 08/2007    with node bx   ELECTROPHYSIOLOGIC STUDY N/A 05/07/2016   Procedure: Atrial Fibrillation Ablation;  Surgeon: Gearld Kerstein Meredith Leeds, MD;  Location: [facility];  Service:  Cardiovascular;  Laterality: N/A;     Current Outpatient Medications  Medication Sig Dispense Refill   acetaminophen (TYLENOL) 325 MG tablet Take 500 mg by mouth every 6 (six) hours as needed for mild pain or fever.      aspirin EC 81 MG tablet Take 1 tablet (81 mg total) by mouth daily. Swallow whole. 30 tablet 12   cholecalciferol (VITAMIN D) 1000 UNITS tablet Take 1,000 Units by mouth 2 (two) times daily.     FLUZONE HIGH-DOSE QUADRIVALENT 0.7 ML SUSY      loratadine (CLARITIN) 10 MG tablet Take 10 mg by mouth daily.     Melatonin 3 MG TABS Take 3 mg by mouth at bedtime as needed (for sleep).      Multiple Vitamins-Minerals (ICAPS) TABS Take 1 tablet by mouth 2 (two) times daily.      Omega-3 Fatty Acids (FISH OIL) 1000 MG CAPS Take 2 capsules (2,000 mg total) by mouth daily. 100 capsule 3   prednisoLONE acetate (PRED FORTE) 1 % ophthalmic suspension Place 2 drops into the left eye.     triamcinolone ointment (KENALOG) 0.5 % Apply 1 application. topically 2 (two) times daily. Apply to hand rash 60 g 3   famotidine (PEPCID) 10 MG tablet Take 10 mg by mouth daily. (Patient not taking: Reported on 01/14/2023)     No current facility-administered medications for this visit.    Allergies:   Codeine and Simvastatin   Social History:  The patient  reports that she quit smoking about 46 years ago. Her smoking use included cigarettes. She has never used smokeless tobacco. She reports that she does not drink alcohol and does not use drugs.   Family History:  The patient's family history includes Breast cancer in her sister; Clotting disorder (age of onset: 73) in her mother; Healthy in her brother; Heart attack in her father; Heart disease in her mother; Heart disease (age of onset: 9) in her father; Hypertension in her sister; Lymphoma in her sister; Other in her maternal grandmother; Other (age of onset: 59) in her mother.   ROS:  Please see the history of present illness.   Otherwise, review of  systems is positive for none.   All other systems are reviewed and negative.   PHYSICAL EXAM: VS:  BP 130/86   Pulse (!) 59   Ht 5' 3"$  (1.6 m)   Wt 115 lb (52.2 kg)   SpO2 96%   BMI 20.37 kg/m  , BMI Body mass index is 20.37 kg/m. GEN: Well nourished, well developed, in no acute distress  HEENT: normal  Neck: no JVD, carotid bruits, or masses Cardiac: RRR; no murmurs, rubs, or gallops,no edema  Respiratory:  clear to auscultation bilaterally, normal work of breathing GI: soft, nontender, nondistended, + BS MS: no deformity or atrophy  Skin: warm and dry Neuro:  Strength and sensation are intact Psych: euthymic mood, full affect  EKG:  EKG is ordered today. Personal review of the ekg ordered shows sinus rhythm, IVCD    Recent Labs: 12/07/2022: ALT 23; BUN 9; Creatinine, Ser 0.50; Hemoglobin 12.6; Platelets 256.0; Potassium 4.0; Sodium 139; TSH 2.51    Lipid Panel     Component Value Date/Time   CHOL 180 12/07/2022 0822   TRIG 116.0 12/07/2022 0822   HDL 47.30 12/07/2022 0822   CHOLHDL 4 12/07/2022 0822   VLDL 23.2 12/07/2022 0822   LDLCALC 109 (H) 12/07/2022 0822     Wt Readings from Last 3 Encounters:  01/14/23 115 lb (52.2 kg)  12/07/22 114 lb (51.7 kg)  06/02/22 120 lb (54.4 kg)      Other studies Reviewed: Additional studies/ records that were reviewed today include: 07/09/15 TTE Review of the above records today demonstrates: - Normal LV function; elevated LV filling pressure; mild AI;   thickened MV with prolapse of the anterior leaflet; eccentric,   posteriorly directed MR difficult to quantitate but appears to be   mild (may be underestimated due to eccentric nature); mild TR,   mildly elevated pulmonary pressures.    ASSESSMENT AND PLAN:  1.  Persistent atrial fibrillation: CHA2DS2-VASc of 3.  Status post ablation 05/07/2016.  She had not had further episodes since ablation and Xarelto has been stopped.  She was put on aspirin by her primary physician.   Her calcium score is low.  [name] stop aspirin today.  Otherwise no changes.  2.  Hyperlipidemia: Continue simvastatin per primary care   Current medicines are reviewed at length with the patient today.   The patient does not have concerns regarding her medicines.  The following changes were made today: Stop aspirin  Labs/ tests ordered today include:  Orders Placed This Encounter  Procedures   EKG 12-Lead      Disposition:   FU 24 months  Signed, Danica Camarena Meredith Leeds, MD  01/14/2023 10:06 AM     [facility] [address] [place] [address] [phone] (office) [phone] (fax)

## 2023-02-01 ENCOUNTER — Other Ambulatory Visit: Payer: Self-pay | Admitting: Internal Medicine

## 2023-02-01 DIAGNOSIS — Z1231 Encounter for screening mammogram for malignant neoplasm of breast: Secondary | ICD-10-CM

## 2023-03-08 ENCOUNTER — Encounter (INDEPENDENT_AMBULATORY_CARE_PROVIDER_SITE_OTHER): Payer: PPO | Admitting: Ophthalmology

## 2023-03-16 ENCOUNTER — Ambulatory Visit
Admission: RE | Admit: 2023-03-16 | Discharge: 2023-03-16 | Disposition: A | Discharge status: Home / Self Care | Payer: Medicare HMO | Source: Ambulatory Visit | Attending: Internal Medicine | Admitting: Internal Medicine

## 2023-03-16 DIAGNOSIS — Z1231 Encounter for screening mammogram for malignant neoplasm of breast: Secondary | ICD-10-CM | POA: Diagnosis not present

## 2023-03-22 ENCOUNTER — Encounter (INDEPENDENT_AMBULATORY_CARE_PROVIDER_SITE_OTHER): Payer: Medicare HMO | Admitting: Ophthalmology

## 2023-03-22 DIAGNOSIS — H35033 Hypertensive retinopathy, bilateral: Secondary | ICD-10-CM | POA: Diagnosis not present

## 2023-03-22 DIAGNOSIS — H353122 Nonexudative age-related macular degeneration, left eye, intermediate dry stage: Secondary | ICD-10-CM

## 2023-03-22 DIAGNOSIS — I10 Essential (primary) hypertension: Secondary | ICD-10-CM

## 2023-03-22 DIAGNOSIS — H43813 Vitreous degeneration, bilateral: Secondary | ICD-10-CM | POA: Diagnosis not present

## 2023-03-22 DIAGNOSIS — H353211 Exudative age-related macular degeneration, right eye, with active choroidal neovascularization: Secondary | ICD-10-CM

## 2023-03-24 DIAGNOSIS — J0110 Acute frontal sinusitis, unspecified: Secondary | ICD-10-CM | POA: Diagnosis not present

## 2023-03-24 DIAGNOSIS — J40 Bronchitis, not specified as acute or chronic: Secondary | ICD-10-CM | POA: Diagnosis not present

## 2023-03-24 DIAGNOSIS — R059 Cough, unspecified: Secondary | ICD-10-CM | POA: Diagnosis not present

## 2023-03-29 DIAGNOSIS — J019 Acute sinusitis, unspecified: Secondary | ICD-10-CM | POA: Diagnosis not present

## 2023-03-29 DIAGNOSIS — J209 Acute bronchitis, unspecified: Secondary | ICD-10-CM | POA: Diagnosis not present

## 2023-03-29 DIAGNOSIS — R0981 Nasal congestion: Secondary | ICD-10-CM | POA: Diagnosis not present

## 2023-03-29 DIAGNOSIS — R051 Acute cough: Secondary | ICD-10-CM | POA: Diagnosis not present

## 2023-04-06 ENCOUNTER — Ambulatory Visit: Payer: Medicare HMO | Admitting: Internal Medicine

## 2023-04-13 ENCOUNTER — Ambulatory Visit: Payer: Medicare HMO | Admitting: Podiatry

## 2023-04-13 DIAGNOSIS — Q828 Other specified congenital malformations of skin: Secondary | ICD-10-CM

## 2023-04-13 NOTE — Progress Notes (Signed)
Subjective:  Patient ID: Vicki [name], female    DOB: December 25, 1945,  MRN: [id number]  Chief Complaint  Patient presents with   Callouses    77 y.o. female presents with the above complaint.  Patient presents with bilateral submetatarsal 5 porokeratotic lesion painful to touch is progressive and worse hurts with ambulation worse with pressure she would like it evaluated.  She states is hard going on a [place] surface.  Pain scale 7 out of 10 dull achy in nature   Review of Systems: Negative except as noted in the HPI. Denies N/V/F/Ch.  Past Medical History:  Diagnosis Date   Allergic rhinitis    Anxiety    Aortic insufficiency    Breast cancer ([facility]) 2008   Left Breast Cancer   Contact dermatitis    Diverticulosis of colon    DJD (degenerative joint disease)    Dysrhythmia    A-fib   GERD (gastroesophageal reflux disease)    Hx of colonic polyps    Hyperlipidemia    Macular degeneration, bilateral    Osteopenia    Paroxysmal atrial fibrillation (HCC)    A. fib is now persistent.   Personal history of radiation therapy 2008   Left Breast Cancer   Visit for monitoring Tikosyn therapy 09/06/2015    Current Outpatient Medications:    acetaminophen (TYLENOL) 325 MG tablet, Take 500 mg by mouth every 6 (six) hours as needed for mild pain or fever. , Disp: , Rfl:    aspirin EC 81 MG tablet, Take 1 tablet (81 mg total) by mouth daily. Swallow whole., Disp: 30 tablet, Rfl: 12   cholecalciferol (VITAMIN D) 1000 UNITS tablet, Take 1,000 Units by mouth 2 (two) times daily., Disp: , Rfl:    famotidine (PEPCID) 10 MG tablet, Take 10 mg by mouth daily. (Patient not taking: Reported on 01/14/2023), Disp: , Rfl:    FLUZONE HIGH-DOSE QUADRIVALENT 0.7 ML SUSY, , Disp: , Rfl:    loratadine (CLARITIN) 10 MG tablet, Take 10 mg by mouth daily., Disp: , Rfl:    Melatonin 3 MG TABS, Take 3 mg by mouth at bedtime as needed (for sleep). , Disp: , Rfl:    Multiple Vitamins-Minerals (ICAPS) TABS,  Take 1 tablet by mouth 2 (two) times daily. , Disp: , Rfl:    Omega-3 Fatty Acids (FISH OIL) 1000 MG CAPS, Take 2 capsules (2,000 mg total) by mouth daily., Disp: 100 capsule, Rfl: 3   prednisoLONE acetate (PRED FORTE) 1 % ophthalmic suspension, Place 2 drops into the left eye., Disp: , Rfl:    triamcinolone ointment (KENALOG) 0.5 %, Apply 1 application. topically 2 (two) times daily. Apply to hand rash, Disp: 60 g, Rfl: 3  Social History   Tobacco Use  Smoking Status Former   Types: Cigarettes   Quit date: 12/06/1976   Years since quitting: 46.3  Smokeless Tobacco Never  Tobacco Comments   smoked approx 10 years    Allergies  Allergen Reactions   Codeine     REACTION: unknown   Simvastatin     Muscle weakness   Objective:  There were no vitals filed for this visit. There is no height or weight on file to calculate BMI. Constitutional Well developed. Well nourished.  Vascular Dorsalis pedis pulses palpable bilaterally. Posterior tibial pulses palpable bilaterally. Capillary refill normal to all digits.  No cyanosis or clubbing noted. Pedal hair growth normal.  Neurologic Normal speech. Oriented to person, place, and time. Epicritic sensation to light touch grossly present  bilaterally.  Dermatologic Hyperkeratotic lesion with central nucleated core noted to his bilateral submet 5.  Mild pain on palpation.  Orthopedic: Normal joint ROM without pain or crepitus bilaterally. No visible deformities. No bony tenderness.   Radiographs: None Assessment:   1. Porokeratosis    Plan:  Patient was evaluated and treated and all questions answered.  Bilateral submetatarsal 5 porokeratosis -All questions and concerns were reviewed.  History is reviewed.  Extensive detail -Using chisel blade to handle the lesion was debride down to healthy dry tissue no complication noted no pinpoint bleeding noted if any foot and ankle issues on future she will come back and see me.  If there is no  improvement we will discuss floating osteotomy in the future  No follow-ups on file.

## 2023-04-14 DIAGNOSIS — I4891 Unspecified atrial fibrillation: Secondary | ICD-10-CM | POA: Diagnosis not present

## 2023-04-14 DIAGNOSIS — I739 Peripheral vascular disease, unspecified: Secondary | ICD-10-CM | POA: Diagnosis not present

## 2023-04-14 DIAGNOSIS — Z825 Family history of asthma and other chronic lower respiratory diseases: Secondary | ICD-10-CM | POA: Diagnosis not present

## 2023-04-14 DIAGNOSIS — Z803 Family history of malignant neoplasm of breast: Secondary | ICD-10-CM | POA: Diagnosis not present

## 2023-04-14 DIAGNOSIS — D84822 Immunodeficiency due to external causes: Secondary | ICD-10-CM | POA: Diagnosis not present

## 2023-04-14 DIAGNOSIS — Z87891 Personal history of nicotine dependence: Secondary | ICD-10-CM | POA: Diagnosis not present

## 2023-04-14 DIAGNOSIS — Z008 Encounter for other general examination: Secondary | ICD-10-CM | POA: Diagnosis not present

## 2023-04-14 DIAGNOSIS — D6869 Other thrombophilia: Secondary | ICD-10-CM | POA: Diagnosis not present

## 2023-04-14 DIAGNOSIS — I10 Essential (primary) hypertension: Secondary | ICD-10-CM | POA: Diagnosis not present

## 2023-04-14 DIAGNOSIS — Z823 Family history of stroke: Secondary | ICD-10-CM | POA: Diagnosis not present

## 2023-04-14 DIAGNOSIS — Z853 Personal history of malignant neoplasm of breast: Secondary | ICD-10-CM | POA: Diagnosis not present

## 2023-04-14 DIAGNOSIS — H353290 Exudative age-related macular degeneration, unspecified eye, stage unspecified: Secondary | ICD-10-CM | POA: Diagnosis not present

## 2023-05-31 ENCOUNTER — Encounter (INDEPENDENT_AMBULATORY_CARE_PROVIDER_SITE_OTHER): Payer: Medicare HMO | Admitting: Ophthalmology

## 2023-05-31 DIAGNOSIS — H353112 Nonexudative age-related macular degeneration, right eye, intermediate dry stage: Secondary | ICD-10-CM

## 2023-05-31 DIAGNOSIS — H35033 Hypertensive retinopathy, bilateral: Secondary | ICD-10-CM | POA: Diagnosis not present

## 2023-05-31 DIAGNOSIS — I10 Essential (primary) hypertension: Secondary | ICD-10-CM

## 2023-05-31 DIAGNOSIS — H43813 Vitreous degeneration, bilateral: Secondary | ICD-10-CM | POA: Diagnosis not present

## 2023-05-31 DIAGNOSIS — H353221 Exudative age-related macular degeneration, left eye, with active choroidal neovascularization: Secondary | ICD-10-CM

## 2023-08-02 ENCOUNTER — Encounter (INDEPENDENT_AMBULATORY_CARE_PROVIDER_SITE_OTHER): Payer: Medicare HMO | Admitting: Ophthalmology

## 2023-08-02 DIAGNOSIS — H353221 Exudative age-related macular degeneration, left eye, with active choroidal neovascularization: Secondary | ICD-10-CM | POA: Diagnosis not present

## 2023-08-02 DIAGNOSIS — H43813 Vitreous degeneration, bilateral: Secondary | ICD-10-CM

## 2023-08-02 DIAGNOSIS — H353112 Nonexudative age-related macular degeneration, right eye, intermediate dry stage: Secondary | ICD-10-CM | POA: Diagnosis not present

## 2023-08-02 DIAGNOSIS — H35033 Hypertensive retinopathy, bilateral: Secondary | ICD-10-CM | POA: Diagnosis not present

## 2023-08-02 DIAGNOSIS — I10 Essential (primary) hypertension: Secondary | ICD-10-CM | POA: Diagnosis not present

## 2023-10-04 ENCOUNTER — Encounter (INDEPENDENT_AMBULATORY_CARE_PROVIDER_SITE_OTHER): Payer: Medicare HMO | Admitting: Ophthalmology

## 2023-10-04 DIAGNOSIS — H353221 Exudative age-related macular degeneration, left eye, with active choroidal neovascularization: Secondary | ICD-10-CM

## 2023-10-04 DIAGNOSIS — H353112 Nonexudative age-related macular degeneration, right eye, intermediate dry stage: Secondary | ICD-10-CM | POA: Diagnosis not present

## 2023-10-04 DIAGNOSIS — H35033 Hypertensive retinopathy, bilateral: Secondary | ICD-10-CM | POA: Diagnosis not present

## 2023-10-04 DIAGNOSIS — H43813 Vitreous degeneration, bilateral: Secondary | ICD-10-CM | POA: Diagnosis not present

## 2023-10-04 DIAGNOSIS — I10 Essential (primary) hypertension: Secondary | ICD-10-CM | POA: Diagnosis not present

## 2023-10-21 ENCOUNTER — Ambulatory Visit (INDEPENDENT_AMBULATORY_CARE_PROVIDER_SITE_OTHER): Payer: Medicare HMO

## 2023-10-21 DIAGNOSIS — Z0000 Encounter for general adult medical examination without abnormal findings: Secondary | ICD-10-CM

## 2023-10-21 NOTE — Patient Instructions (Signed)
Ms. Fiebelkorn , Thank you for taking time to come for your Medicare Wellness Visit. I appreciate your ongoing commitment to your health goals. Please review the following plan we discussed and let me know if I can assist you in the future.   Referrals/Orders/Follow-Ups/Clinician Recommendations: none  This is a list of the screening recommended for you and due dates:  Health Maintenance  Topic Date Due   Medicare Annual Wellness Visit  10/20/2024   DTaP/Tdap/Td vaccine (2 - Tdap) 07/03/2025   Pneumonia Vaccine  Completed   Flu Shot  Completed   DEXA scan (bone density measurement)  Completed   Hepatitis C Screening  Completed   Zoster (Shingles) Vaccine  Completed   HPV Vaccine  Aged Out   Colon Cancer Screening  Discontinued   COVID-19 Vaccine  Discontinued    Advanced directives: (Copy Requested) Please bring a copy of your health care power of attorney and living will to the office to be added to your chart at your convenience.  Next Medicare Annual Wellness Visit scheduled for next year: Yes  Insert Preventive Care attachment Insert FALL PREVENTION attachment if needed

## 2023-10-21 NOTE — Progress Notes (Cosign Needed Addendum)
Subjective:   Vicki [name] is a 77 y.o. female who presents for Medicare Annual (Subsequent) preventive examination.  Visit Complete: Virtual I connected with  Cherlynn Kaiser on 10/21/23 by a audio enabled telemedicine application and verified that I am speaking with the correct person using two identifiers.  Patient Location: Home  Provider Location: Office/Clinic  I discussed the limitations of evaluation and management by telemedicine. The patient expressed understanding and agreed to proceed.  Vital Signs: Because this visit was a virtual/telehealth visit, some criteria may be missing or patient reported. Any vitals not documented were not able to be obtained and vitals that have been documented are patient reported.    Cardiac Risk Factors include: advanced age (>59men, >92 women);dyslipidemia     Objective:    Today's Vitals   There is no height or weight on file to calculate BMI.     10/21/2023    8:12 AM 10/13/2022   10:37 AM 10/12/2021    1:49 PM 06/27/2020    9:20 AM 01/14/2020    4:50 PM 01/14/2020    4:49 AM 01/11/2020   11:24 AM  Advanced Directives  Does Patient Have a Medical Advance Directive? No No No Yes No No No  Does patient want to make changes to medical advance directive?    No - Patient declined     Would patient like information on creating a medical advance directive?  No - Patient declined No - Patient declined  No - Patient declined      Current Medications (verified) Outpatient Encounter Medications as of 10/21/2023  Medication Sig   acetaminophen (TYLENOL) 325 MG tablet Take 500 mg by mouth every 6 (six) hours as needed for mild pain or fever.    cholecalciferol (VITAMIN D) 1000 UNITS tablet Take 1,000 Units by mouth 2 (two) times daily.   loratadine (CLARITIN) 10 MG tablet Take 10 mg by mouth daily.   Melatonin 3 MG TABS Take 3 mg by mouth at bedtime as needed (for sleep).    Omega-3 Fatty Acids (FISH OIL) 1000 MG CAPS Take 2 capsules  (2,000 mg total) by mouth daily.   prednisoLONE acetate (PRED FORTE) 1 % ophthalmic suspension Place 2 drops into the left eye.   triamcinolone ointment (KENALOG) 0.5 % Apply 1 application. topically 2 (two) times daily. Apply to hand rash   aspirin EC 81 MG tablet Take 1 tablet (81 mg total) by mouth daily. Swallow whole. (Patient not taking: Reported on 10/21/2023)   famotidine (PEPCID) 10 MG tablet Take 10 mg by mouth daily. (Patient not taking: Reported on 01/14/2023)   FLUZONE HIGH-DOSE QUADRIVALENT 0.7 ML SUSY    Multiple Vitamins-Minerals (ICAPS) TABS Take 1 tablet by mouth 2 (two) times daily.  (Patient not taking: Reported on 10/21/2023)   No facility-administered encounter medications on file as of 10/21/2023.    Allergies (verified) Codeine and Simvastatin   History: Past Medical History:  Diagnosis Date   Allergic rhinitis    Anxiety    Aortic insufficiency    Breast cancer (HCC) 2008   Left Breast Cancer   Contact dermatitis    Diverticulosis of colon    DJD (degenerative joint disease)    Dysrhythmia    A-fib   GERD (gastroesophageal reflux disease)    Hx of colonic polyps    Hyperlipidemia    Macular degeneration, bilateral    Osteopenia    Paroxysmal atrial fibrillation (HCC)    A. fib is now persistent.  Personal history of radiation therapy 2008   Left Breast Cancer   Visit for monitoring Tikosyn therapy 09/06/2015   Past Surgical History:  Procedure Laterality Date   ABDOMINAL HYSTERECTOMY  1984   with 1 ovary removal   BREAST LUMPECTOMY Left 08/2007    with node bx   ELECTROPHYSIOLOGIC STUDY N/A 05/07/2016   Procedure: Atrial Fibrillation Ablation;  Surgeon: Will Jorja Loa, MD;  Location: [facility];  Service: Cardiovascular;  Laterality: N/A;   Family History  Problem Relation Age of Onset   Heart disease Father 63   Heart attack Father    Other Mother 13       AVR   Heart disease Mother    Clotting disorder Mother 83   Breast cancer  Sister    Lymphoma Sister    Hypertension Sister    Healthy Brother    Other Maternal Grandmother    Colon cancer Neg Hx    Esophageal cancer Neg Hx    Pancreatic cancer Neg Hx    Social History   Socioeconomic History   Marital status: Married    Spouse name: Dimond Rascon   Number of children: 3   Years of education: Not on file   Highest education level: Not on file  Occupational History   Occupation: ACCOUNTING    Employer: [employer]  Tobacco Use   Smoking status: Former    Current packs/day: 0.00    Types: Cigarettes    Quit date: 12/06/1976    Years since quitting: 46.9   Smokeless tobacco: Never   Tobacco comments:    smoked approx 10 years  Vaping Use   Vaping status: Never Used  Substance and Sexual Activity   Alcohol use: No    Alcohol/week: 0.0 standard drinks of alcohol   Drug use: No   Sexual activity: Not Currently  Other Topics Concern   Not on file  Social History Narrative   Not on file   Social Determinants of Health   Financial Resource Strain: Low Risk  (10/21/2023)   Overall Financial Resource Strain (CARDIA)    Difficulty of Paying Living Expenses: Not hard at all  Food Insecurity: No Food Insecurity (10/21/2023)   Hunger Vital Sign    Worried About Running Out of Food in the Last Year: Never true    Ran Out of Food in the Last Year: Never true  Transportation Needs: No Transportation Needs (10/21/2023)   PRAPARE - Administrator, Civil Service (Medical): No    Lack of Transportation (Non-Medical): No  Physical Activity: Inactive (10/21/2023)   Exercise Vital Sign    Days of Exercise per Week: 0 days    Minutes of Exercise per Session: 0 min  Stress: No Stress Concern Present (10/21/2023)   [employer] of Occupational Health - Occupational Stress Questionnaire    Feeling of Stress : Not at all  Social Connections: Moderately Integrated (10/21/2023)   Social Connection and Isolation Panel [NHANES]    Frequency of  Communication with Friends and Family: More than three times a week    Frequency of Social Gatherings with Friends and Family: Not on file    Attends Religious Services: More than 4 times per year    Active Member of [employer] or Organizations: No    Attends Banker Meetings: Never    Marital Status: Married    Tobacco Counseling Counseling given: Not Answered Tobacco comments: smoked approx 10 years   Clinical Intake:  Pre-visit  preparation completed: Yes  Pain : No/denies pain     Nutritional Risks: None Diabetes: No  How often do you need to have someone help you when you read instructions, pamphlets, or other written materials from your doctor or pharmacy?: 1 - Never  Interpreter Needed?: No  Information entered by :: NAllen LPN   Activities of Daily Living    10/21/2023    8:07 AM  In your present state of health, do you have any difficulty performing the following activities:  Hearing? 0  Vision? 0  Difficulty concentrating or making decisions? 0  Walking or climbing stairs? 0  Dressing or bathing? 0  Doing errands, shopping? 0  Preparing Food and eating ? N  Using the Toilet? N  In the past six months, have you accidently leaked urine? N  Do you have problems with loss of bowel control? N  Managing your Medications? N  Managing your Finances? N  Housekeeping or managing your Housekeeping? N    Patient Care Team: Plotnikov, Georgina Quint, MD as PCP - General (Internal Medicine) Regan Lemming, MD as PCP - Electrophysiology (Cardiology) Runell Gess, MD as Consulting Physician (Cardiology) Mateo Flow, MD as Consulting Physician (Ophthalmology) Sherrie George, MD as Consulting Physician (Ophthalmology)  Indicate any recent Medical Services you may have received from other than [facility] providers in the past year (date may be approximate).     Assessment:   This is a routine wellness examination for Vicki [name].  Hearing/Vision  screen Hearing Screening - Comments:: Denies hearing issues Vision Screening - Comments:: Regular eye exams, Dr. Ashley Royalty   Goals Addressed             This Visit's Progress    Patient Stated       10/21/2023 stay healthy       Depression Screen    10/21/2023    8:13 AM 12/07/2022    8:03 AM 10/13/2022   10:40 AM 02/03/2022    2:55 PM 10/12/2021    1:54 PM 11/04/2020    7:57 AM 06/27/2020    9:20 AM  PHQ 2/9 Scores  PHQ - 2 Score 0 0 0 0 0 0 0  PHQ- 9 Score 0          Fall Risk    10/21/2023    8:12 AM 12/07/2022    8:03 AM 10/13/2022   10:38 AM 02/03/2022    2:55 PM 10/12/2021    1:50 PM  Fall Risk   Falls in the past year? 0 0 0 0 0  Number falls in past yr: 0 0 0  0  Injury with Fall? 0 0 0  0  Risk for fall due to : No Fall Risks No Fall Risks No Fall Risks  No Fall Risks  Follow up Falls prevention discussed;Falls evaluation completed Falls evaluation completed Falls prevention discussed  Falls evaluation completed    MEDICARE RISK AT HOME: Medicare Risk at Home Any stairs in or around the home?: No If so, are there any without handrails?: No Home free of loose throw rugs in walkways, pet beds, electrical cords, etc?: Yes Adequate lighting in your home to reduce risk of falls?: Yes Life alert?: No Use of a cane, walker or w/c?: No Grab bars in the bathroom?: No Shower chair or bench in shower?: No Elevated toilet seat or a handicapped toilet?: No  TIMED UP AND GO:  Was the test performed?  No    Cognitive Function:  01/16/2018    8:38 AM  MMSE - Mini Mental State Exam  Orientation to time 5  Orientation to Place 5  Registration 3  Attention/ Calculation 5  Recall 3  Language- name 2 objects 2  Language- repeat 1  Language- follow 3 step command 3  Language- read & follow direction 1  Write a sentence 1  Copy design 1  Total score 30        10/21/2023    8:14 AM 10/13/2022   10:49 AM  6CIT Screen  What Year? 0 points 0 points  What month?  0 points 0 points  What time? 0 points 0 points  Count back from 20 0 points 0 points  Months in reverse 0 points 0 points  Repeat phrase 0 points 0 points  Total Score 0 points 0 points    Immunizations Immunization History  Administered Date(s) Administered   Fluad Quad(high Dose 65+) 09/21/2020, 09/04/2022, 08/25/2023   Influenza Split 09/17/2011, 09/21/2012   Influenza Whole 09/07/2010, 09/19/2016   Influenza, High Dose Seasonal PF 08/30/2017, 08/30/2018, 09/05/2019, 08/27/2021   Influenza-Unspecified 09/05/2014, 09/15/2015   PFIZER(Purple Top)SARS-COV-2 Vaccination 03/20/2020, 04/14/2020   PNEUMOCOCCAL CONJUGATE-20 08/27/2021   Pneumococcal Conjugate-13 07/04/2015   Pneumococcal Polysaccharide-23 03/19/2014, 05/05/2021   Td 07/04/2015   Zoster Recombinant(Shingrix) 08/09/2019, 11/06/2019   Zoster, Live 09/28/2016    TDAP status: Up to date  Flu Vaccine status: Up to date  Pneumococcal vaccine status: Up to date  Covid-19 vaccine status: Completed vaccines  Qualifies for Shingles Vaccine? Yes   Zostavax completed Yes   Shingrix Completed?: Yes  Screening Tests Health Maintenance  Topic Date Due   Medicare Annual Wellness (AWV)  10/20/2024   DTaP/Tdap/Td (2 - Tdap) 07/03/2025   Pneumonia Vaccine 41+ Years old  Completed   INFLUENZA VACCINE  Completed   DEXA SCAN  Completed   Hepatitis C Screening  Completed   Zoster Vaccines- Shingrix  Completed   HPV VACCINES  Aged Out   Colonoscopy  Discontinued   COVID-19 Vaccine  Discontinued    Health Maintenance  There are no preventive care reminders to display for this patient.   Colorectal cancer screening: No longer required.   Mammogram status: Completed 03/16/2023. Repeat every year  Bone Density status: Completed 02/16/2012.   Lung Cancer Screening: (Low Dose CT Chest recommended if Age 54-80 years, 20 pack-year currently smoking OR have quit w/in 15years.) does not qualify.   Lung Cancer Screening  Referral: no  Additional Screening:  Hepatitis C Screening: does qualify; Completed 01/06/2017  Vision Screening: Recommended annual ophthalmology exams for early detection of glaucoma and other disorders of the eye. Is the patient up to date with their annual eye exam?  Yes  Who is the provider or what is the name of the office in which the patient attends annual eye exams? Dr. Ashley Royalty If pt is not established with a provider, would they like to be referred to a provider to establish care? No .   Dental Screening: Recommended annual dental exams for proper oral hygiene  Diabetic Foot Exam: n/a  Community Resource Referral / Chronic Care Management: CRR required this visit?  No   CCM required this visit?  No     Plan:     I have personally reviewed and noted the following in the patient's chart:   Medical and social history Use of alcohol, tobacco or illicit drugs  Current medications and supplements including opioid prescriptions. Patient is not currently taking opioid prescriptions. Functional  ability and status Nutritional status Physical activity Advanced directives List of other physicians Hospitalizations, surgeries, and ER visits in previous 12 months Vitals Screenings to include cognitive, depression, and falls Referrals and appointments  In addition, I have reviewed and discussed with patient certain preventive protocols, quality metrics, and best practice recommendations. A written personalized care plan for preventive services as well as general preventive health recommendations were provided to patient.     Barb Merino, LPN   41/[id number]/0630   After Visit Summary: (MyChart) Due to this being a telephonic visit, the after visit summary with patients personalized plan was offered to patient via MyChart   Nurse Notes: none   Medical screening examination/treatment/procedure(s) were performed by non-physician practitioner and as supervising physician I was  immediately available for consultation/collaboration.  I agree with above. Jacinta Shoe, MD

## 2023-12-02 ENCOUNTER — Other Ambulatory Visit: Payer: Self-pay | Admitting: Internal Medicine

## 2023-12-12 ENCOUNTER — Encounter: Payer: Medicare HMO | Admitting: Internal Medicine

## 2023-12-13 ENCOUNTER — Encounter (INDEPENDENT_AMBULATORY_CARE_PROVIDER_SITE_OTHER): Payer: Self-pay | Admitting: Ophthalmology

## 2023-12-13 DIAGNOSIS — I10 Essential (primary) hypertension: Secondary | ICD-10-CM | POA: Diagnosis not present

## 2023-12-13 DIAGNOSIS — H43813 Vitreous degeneration, bilateral: Secondary | ICD-10-CM

## 2023-12-13 DIAGNOSIS — H35033 Hypertensive retinopathy, bilateral: Secondary | ICD-10-CM

## 2023-12-13 DIAGNOSIS — H353112 Nonexudative age-related macular degeneration, right eye, intermediate dry stage: Secondary | ICD-10-CM

## 2023-12-13 DIAGNOSIS — H353221 Exudative age-related macular degeneration, left eye, with active choroidal neovascularization: Secondary | ICD-10-CM

## 2023-12-29 ENCOUNTER — Ambulatory Visit (INDEPENDENT_AMBULATORY_CARE_PROVIDER_SITE_OTHER): Payer: HMO | Admitting: Internal Medicine

## 2023-12-29 ENCOUNTER — Ambulatory Visit: Payer: PPO | Attending: Internal Medicine

## 2023-12-29 ENCOUNTER — Encounter: Payer: Self-pay | Admitting: Internal Medicine

## 2023-12-29 VITALS — BP 134/82 | HR 62 | Temp 97.8°F | Ht 63.0 in | Wt 115.0 lb

## 2023-12-29 DIAGNOSIS — I2583 Coronary atherosclerosis due to lipid rich plaque: Secondary | ICD-10-CM

## 2023-12-29 DIAGNOSIS — G459 Transient cerebral ischemic attack, unspecified: Secondary | ICD-10-CM | POA: Insufficient documentation

## 2023-12-29 DIAGNOSIS — T466X5A Adverse effect of antihyperlipidemic and antiarteriosclerotic drugs, initial encounter: Secondary | ICD-10-CM | POA: Diagnosis not present

## 2023-12-29 DIAGNOSIS — I480 Paroxysmal atrial fibrillation: Secondary | ICD-10-CM | POA: Diagnosis not present

## 2023-12-29 DIAGNOSIS — M791 Myalgia, unspecified site: Secondary | ICD-10-CM

## 2023-12-29 DIAGNOSIS — I2510 Atherosclerotic heart disease of native coronary artery without angina pectoris: Secondary | ICD-10-CM

## 2023-12-29 DIAGNOSIS — E785 Hyperlipidemia, unspecified: Secondary | ICD-10-CM

## 2023-12-29 DIAGNOSIS — Z8673 Personal history of transient ischemic attack (TIA), and cerebral infarction without residual deficits: Secondary | ICD-10-CM | POA: Diagnosis not present

## 2023-12-29 LAB — CBC WITH DIFFERENTIAL/PLATELET
Basophils Absolute: 0 10*3/uL (ref 0.0–0.1)
Basophils Relative: 0.6 % (ref 0.0–3.0)
Eosinophils Absolute: 0 10*3/uL (ref 0.0–0.7)
Eosinophils Relative: 0.1 % (ref 0.0–5.0)
HCT: 40.7 % (ref 36.0–46.0)
Hemoglobin: 13.4 g/dL (ref 12.0–15.0)
Lymphocytes Relative: 27.1 % (ref 12.0–46.0)
Lymphs Abs: 1.5 10*3/uL (ref 0.7–4.0)
MCHC: 32.8 g/dL (ref 30.0–36.0)
MCV: 95.8 fL (ref 78.0–100.0)
Monocytes Absolute: 0.7 10*3/uL (ref 0.1–1.0)
Monocytes Relative: 12.8 % — ABNORMAL HIGH (ref 3.0–12.0)
Neutro Abs: 3.4 10*3/uL (ref 1.4–7.7)
Neutrophils Relative %: 59.4 % (ref 43.0–77.0)
Platelets: 286 10*3/uL (ref 150.0–400.0)
RBC: 4.24 Mil/uL (ref 3.87–5.11)
RDW: 13.5 % (ref 11.5–15.5)
WBC: 5.7 10*3/uL (ref 4.0–10.5)

## 2023-12-29 LAB — COMPREHENSIVE METABOLIC PANEL
ALT: 13 U/L (ref 0–35)
AST: 19 U/L (ref 0–37)
Albumin: 4.3 g/dL (ref 3.5–5.2)
Alkaline Phosphatase: 112 U/L (ref 39–117)
BUN: 12 mg/dL (ref 6–23)
CO2: 26 meq/L (ref 19–32)
Calcium: 8.9 mg/dL (ref 8.4–10.5)
Chloride: 100 meq/L (ref 96–112)
Creatinine, Ser: 0.53 mg/dL (ref 0.40–1.20)
GFR: 89.28 mL/min (ref >60.00)
Glucose, Bld: 86 mg/dL (ref 70–99)
Potassium: 3.4 meq/L — ABNORMAL LOW (ref 3.5–5.1)
Sodium: 137 meq/L (ref 135–145)
Total Bilirubin: 0.6 mg/dL (ref 0.2–1.2)
Total Protein: 7.1 g/dL (ref 6.0–8.3)

## 2023-12-29 LAB — LIPID PANEL
Cholesterol: 257 mg/dL — ABNORMAL HIGH (ref 0–200)
HDL: 76.2 mg/dL (ref >39.00)
LDL Cholesterol: 159 mg/dL — ABNORMAL HIGH (ref 0–99)
NonHDL: 181.22
Total CHOL/HDL Ratio: 3
Triglycerides: 110 mg/dL (ref 0.0–149.0)
VLDL: 22 mg/dL (ref 0.0–40.0)

## 2023-12-29 LAB — URINALYSIS
Bilirubin Urine: NEGATIVE
Hgb urine dipstick: NEGATIVE
Ketones, ur: NEGATIVE
Leukocytes,Ua: NEGATIVE
Nitrite: NEGATIVE
Specific Gravity, Urine: 1.025 (ref 1.000–1.030)
Total Protein, Urine: NEGATIVE
Urine Glucose: NEGATIVE
Urobilinogen, UA: 1 (ref 0.0–1.0)
pH: 6 (ref 5.0–8.0)

## 2023-12-29 LAB — TSH: TSH: 2.55 u[IU]/mL (ref 0.35–5.50)

## 2023-12-29 NOTE — Assessment & Plan Note (Addendum)
Resolved TIA sx's in Dec 2024 Re-start ASA Simvastatin - pt stopped, feeling better, no joint pain now Carotid doppler US Head CT Ziopatch

## 2023-12-29 NOTE — Assessment & Plan Note (Signed)
Simvastatin - pt stopped, feeling better, no joint pain now

## 2023-12-29 NOTE — Assessment & Plan Note (Signed)
No relapse per pt Off Xarelto Re-start ASA Zio patch

## 2023-12-29 NOTE — Progress Notes (Signed)
Subjective:  Patient ID: Vicki [name], female    DOB: 01-05-1946  Age: 78 y.o. MRN: [id number]  CC: Annual Exam   HPI Vicki [name] presents for an episode of double vision, dizziness x 5 min before Christmas - felt like she had a stroke...  No recent A fib per pt  Outpatient Medications Prior to Visit  Medication Sig Dispense Refill   acetaminophen (TYLENOL) 325 MG tablet Take 500 mg by mouth every 6 (six) hours as needed for mild pain or fever.      aspirin EC 81 MG tablet Take 1 tablet (81 mg total) by mouth daily. Swallow whole. 30 tablet 12   cholecalciferol (VITAMIN D) 1000 UNITS tablet Take 1,000 Units by mouth 2 (two) times daily.     famotidine (PEPCID) 10 MG tablet Take 10 mg by mouth daily.     FLUZONE HIGH-DOSE QUADRIVALENT 0.7 ML SUSY      loratadine (CLARITIN) 10 MG tablet Take 10 mg by mouth daily.     Melatonin 3 MG TABS Take 3 mg by mouth at bedtime as needed (for sleep).      Multiple Vitamins-Minerals (ICAPS) TABS Take 1 tablet by mouth 2 (two) times daily.     Omega-3 Fatty Acids (FISH OIL) 1000 MG CAPS Take 2 capsules (2,000 mg total) by mouth daily. 100 capsule 3   prednisoLONE acetate (PRED FORTE) 1 % ophthalmic suspension Place 2 drops into the left eye.     triamcinolone ointment (KENALOG) 0.5 % Apply 1 application. topically 2 (two) times daily. Apply to hand rash 60 g 3   No facility-administered medications prior to visit.    ROS: Review of Systems  Constitutional:  Negative for activity change, appetite change, chills, fatigue and unexpected weight change.  HENT:  Negative for congestion, mouth sores and sinus pressure.   Eyes:  Negative for visual disturbance.  Respiratory:  Negative for cough and chest tightness.   Gastrointestinal:  Negative for abdominal pain and nausea.  Genitourinary:  Negative for difficulty urinating, frequency and vaginal pain.  Musculoskeletal:  Negative for back pain and gait problem.  Skin:  Negative for pallor  and rash.  Neurological:  Negative for dizziness, tremors, weakness, numbness and headaches.  Psychiatric/Behavioral:  Negative for confusion and sleep disturbance.     Objective:  BP 134/82   Pulse 62   Temp 97.8 F (36.6 C) (Temporal)   Ht 5\' 3"  (1.6 m)   Wt 115 lb (52.2 kg)   SpO2 97%   BMI 20.37 kg/m   BP Readings from Last 3 Encounters:  12/29/23 134/82  01/14/23 130/86  12/07/22 130/78    Wt Readings from Last 3 Encounters:  12/29/23 115 lb (52.2 kg)  01/14/23 115 lb (52.2 kg)  12/07/22 114 lb (51.7 kg)    Physical Exam Constitutional:      General: She is not in acute distress.    Appearance: She is well-developed.  HENT:     Head: Normocephalic.     Right Ear: External ear normal.     Left Ear: External ear normal.     Nose: Nose normal.  Eyes:     General:        Right eye: No discharge.        Left eye: No discharge.     Conjunctiva/sclera: Conjunctivae normal.     Pupils: Pupils are equal, round, and reactive to light.  Neck:     Thyroid: No thyromegaly.  Vascular: No JVD.     Trachea: No tracheal deviation.  Cardiovascular:     Rate and Rhythm: Normal rate and regular rhythm.     Heart sounds: Normal heart sounds.  Pulmonary:     Effort: No respiratory distress.     Breath sounds: No stridor. No wheezing.  Abdominal:     General: Bowel sounds are normal. There is no distension.     Palpations: Abdomen is soft. There is no mass.     Tenderness: There is no abdominal tenderness. There is no guarding or rebound.  Musculoskeletal:        General: No tenderness.     Cervical back: Normal range of motion and neck supple. No rigidity.     Right lower leg: No edema.     Left lower leg: No edema.  Lymphadenopathy:     Cervical: No cervical adenopathy.  Skin:    Findings: No erythema or rash.  Neurological:     Mental Status: She is oriented to person, place, and time.     Cranial Nerves: No cranial nerve deficit.     Motor: No abnormal muscle  tone.     Coordination: Coordination normal.     Gait: Gait normal.     Deep Tendon Reflexes: Reflexes normal.  Psychiatric:        Behavior: Behavior normal.        Thought Content: Thought content normal.        Judgment: Judgment normal.      Lab Results  Component Value Date   WBC 4.3 12/07/2022   HGB 12.6 12/07/2022   HCT 36.7 12/07/2022   PLT 256.0 12/07/2022   GLUCOSE 97 12/07/2022   CHOL 180 12/07/2022   TRIG 116.0 12/07/2022   HDL 47.30 12/07/2022   LDLCALC 109 (H) 12/07/2022   ALT 23 12/07/2022   AST 25 12/07/2022   NA 139 12/07/2022   K 4.0 12/07/2022   CL 101 12/07/2022   CREATININE 0.50 12/07/2022   BUN 9 12/07/2022   CO2 30 12/07/2022   TSH 2.51 12/07/2022   INR 1.41 08/07/2015    MM 3D SCREEN BREAST BILATERAL Result Date: 03/17/2023 CLINICAL DATA:  Screening. EXAM: DIGITAL SCREENING BILATERAL MAMMOGRAM WITH TOMOSYNTHESIS AND CAD TECHNIQUE: Bilateral screening digital craniocaudal and mediolateral oblique mammograms were obtained. Bilateral screening digital breast tomosynthesis was performed. The images were evaluated with computer-aided detection. COMPARISON:  Previous exam(s). ACR Breast Density Category b: There are scattered areas of fibroglandular density. FINDINGS: There are no findings suspicious for malignancy. IMPRESSION: No mammographic evidence of malignancy. A result letter of this screening mammogram will be mailed directly to the patient. RECOMMENDATION: Screening mammogram in one year. (Code:SM-B-01Y) BI-RADS CATEGORY  1: Negative. Electronically Signed   By: Sherian Rein M.D.   On: 03/17/2023 13:52    Assessment & Plan:   Problem List Items Addressed This Visit     Dyslipidemia - Primary    Simvastatin - pt stopped, feeling better, no joint pain now      Relevant Orders   TSH   Urinalysis   CBC with Differential/Platelet   Lipid panel   Comprehensive metabolic panel   PAROXYSMAL ATRIAL FIBRILLATION   No relapse per pt Off  Xarelto Re-start ASA Zio patch      Relevant Orders   LONG TERM MONITOR (3-14 DAYS)   TSH   Urinalysis   CBC with Differential/Platelet   Lipid panel   Comprehensive metabolic panel   CAD (coronary artery disease)  Simvastatin - pt stopped, feeling better, no joint pain now      Relevant Orders   TSH   Urinalysis   CBC with Differential/Platelet   Lipid panel   Comprehensive metabolic panel   Myalgia due to statin    Simvastatin - pt stopped, feeling better, no joint pain now      TIA (transient ischemic attack)   Resolved TIA sx's in Dec 2024 Re-start ASA Simvastatin - pt stopped, feeling better, no joint pain now Carotid doppler US Head CT Ziopatch       Relevant Orders   US Carotid Bilateral   LONG TERM MONITOR (3-14 DAYS)   US Carotid Bilateral   CT HEAD WO CONTRAST ( )   TSH   Urinalysis   CBC with Differential/Platelet   Lipid panel   Comprehensive metabolic panel      No orders of the defined types were placed in this encounter.     Follow-up: Return in about 6 weeks (around 02/09/2024) for a follow-up visit.  Sonda Primes, MD

## 2023-12-29 NOTE — Progress Notes (Unsigned)
EP to read

## 2024-01-02 ENCOUNTER — Encounter: Payer: Self-pay | Admitting: Internal Medicine

## 2024-01-05 ENCOUNTER — Ambulatory Visit
Admission: RE | Admit: 2024-01-05 | Discharge: 2024-01-05 | Disposition: A | Discharge status: Home / Self Care | Payer: HMO | Source: Ambulatory Visit | Attending: Internal Medicine | Admitting: Internal Medicine

## 2024-01-05 DIAGNOSIS — R29818 Other symptoms and signs involving the nervous system: Secondary | ICD-10-CM | POA: Diagnosis not present

## 2024-01-05 DIAGNOSIS — I6782 Cerebral ischemia: Secondary | ICD-10-CM | POA: Diagnosis not present

## 2024-01-05 DIAGNOSIS — G459 Transient cerebral ischemic attack, unspecified: Secondary | ICD-10-CM

## 2024-01-19 ENCOUNTER — Encounter: Payer: Self-pay | Admitting: Internal Medicine

## 2024-01-20 DIAGNOSIS — G459 Transient cerebral ischemic attack, unspecified: Secondary | ICD-10-CM | POA: Diagnosis not present

## 2024-01-20 DIAGNOSIS — I480 Paroxysmal atrial fibrillation: Secondary | ICD-10-CM | POA: Diagnosis not present

## 2024-01-23 DIAGNOSIS — L300 Nummular dermatitis: Secondary | ICD-10-CM | POA: Diagnosis not present

## 2024-01-23 DIAGNOSIS — L814 Other melanin hyperpigmentation: Secondary | ICD-10-CM | POA: Diagnosis not present

## 2024-01-23 DIAGNOSIS — L578 Other skin changes due to chronic exposure to nonionizing radiation: Secondary | ICD-10-CM | POA: Diagnosis not present

## 2024-01-23 DIAGNOSIS — L820 Inflamed seborrheic keratosis: Secondary | ICD-10-CM | POA: Diagnosis not present

## 2024-01-23 DIAGNOSIS — L570 Actinic keratosis: Secondary | ICD-10-CM | POA: Diagnosis not present

## 2024-01-25 DIAGNOSIS — I480 Paroxysmal atrial fibrillation: Secondary | ICD-10-CM

## 2024-01-25 DIAGNOSIS — G459 Transient cerebral ischemic attack, unspecified: Secondary | ICD-10-CM | POA: Diagnosis not present

## 2024-01-30 ENCOUNTER — Other Ambulatory Visit: Payer: Self-pay | Admitting: Internal Medicine

## 2024-01-30 ENCOUNTER — Encounter: Payer: Self-pay | Admitting: Internal Medicine

## 2024-01-30 DIAGNOSIS — I459 Conduction disorder, unspecified: Secondary | ICD-10-CM

## 2024-01-30 DIAGNOSIS — Z1231 Encounter for screening mammogram for malignant neoplasm of breast: Secondary | ICD-10-CM

## 2024-02-02 NOTE — Progress Notes (Deleted)
 Cardiology Office Note:    Date:  02/02/2024   ID:  Vicki [name], DOB July 09, 1946, MRN [id number]  PCP:  Tresa Garter, MD   [facility] Providers Cardiologist:  None Electrophysiologist:  Will Jorja Loa, MD { Click to update primary MD,subspecialty MD or APP then REFRESH:1}    Referring MD: Plotnikov, Georgina Quint, MD   No chief complaint on file. ***  History of Present Illness:    Vicki [name] is a 78 y.o. female seen at the request of Dr Posey Rea for evaluation of heart block. She has a history significant for persistent atrial fibrillation, hyperlipidemia, aortic insufficiency. CHA2DS2-VASc of 3. She is post ablation 05/07/2016. She is followed by Dr Elberta Fortis.   Past Medical History:  Diagnosis Date   Allergic rhinitis    Anxiety    Aortic insufficiency    Breast cancer (HCC) 2008   Left Breast Cancer   Contact dermatitis    Diverticulosis of colon    DJD (degenerative joint disease)    Dysrhythmia    A-fib   GERD (gastroesophageal reflux disease)    Hx of colonic polyps    Hyperlipidemia    Macular degeneration, bilateral    Osteopenia    Paroxysmal atrial fibrillation (HCC)    A. fib is now persistent.   Personal history of radiation therapy 2008   Left Breast Cancer   Visit for monitoring Tikosyn therapy 09/06/2015    Past Surgical History:  Procedure Laterality Date   ABDOMINAL HYSTERECTOMY  1984   with 1 ovary removal   BREAST LUMPECTOMY Left 08/2007    with node bx   ELECTROPHYSIOLOGIC STUDY N/A 05/07/2016   Procedure: Atrial Fibrillation Ablation;  Surgeon: Will Jorja Loa, MD;  Location: [facility];  Service: Cardiovascular;  Laterality: N/A;    Current Medications: No outpatient medications have been marked as taking for the 02/03/24 encounter (Appointment) with [place], Hiroki Wint M, MD.     Allergies:   Codeine and Simvastatin   Social History   Socioeconomic History   Marital status: Married    Spouse name:  Lorrain Rivers   Number of children: 3   Years of education: Not on file   Highest education level: Not on file  Occupational History   Occupation: ACCOUNTING    Employer: [employer]  Tobacco Use   Smoking status: Former    Current packs/day: 0.00    Types: Cigarettes    Quit date: 12/06/1976    Years since quitting: 47.1   Smokeless tobacco: Never   Tobacco comments:    smoked approx 10 years  Vaping Use   Vaping status: Never Used  Substance and Sexual Activity   Alcohol use: No    Alcohol/week: 0.0 standard drinks of alcohol   Drug use: No   Sexual activity: Not Currently  Other Topics Concern   Not on file  Social History Narrative   Not on file   Social Drivers of Health   Financial Resource Strain: Low Risk  (10/21/2023)   Overall Financial Resource Strain (CARDIA)    Difficulty of Paying Living Expenses: Not hard at all  Food Insecurity: No Food Insecurity (10/21/2023)   Hunger Vital Sign    Worried About Running Out of Food in the Last Year: Never true    Ran Out of Food in the Last Year: Never true  Transportation Needs: No Transportation Needs (10/21/2023)   PRAPARE - Administrator, Civil Service (Medical): No  Lack of Transportation (Non-Medical): No  Physical Activity: Inactive (10/21/2023)   Exercise Vital Sign    Days of Exercise per Week: 0 days    Minutes of Exercise per Session: 0 min  Stress: No Stress Concern Present (10/21/2023)   [employer] of Occupational Health - Occupational Stress Questionnaire    Feeling of Stress : Not at all  Social Connections: Moderately Integrated (10/21/2023)   Social Connection and Isolation Panel [NHANES]    Frequency of Communication with Friends and Family: More than three times a week    Frequency of Social Gatherings with Friends and Family: Not on file    Attends Religious Services: More than 4 times per year    Active Member of [employer] or Organizations: No    Attends Hospital doctor: Never    Marital Status: Married     Family History: The patient's ***family history includes Breast cancer in her sister; Clotting disorder (age of onset: 33) in her mother; Healthy in her brother; Heart attack in her father; Heart disease in her mother; Heart disease (age of onset: 35) in her father; Hypertension in her sister; Lymphoma in her sister; Other in her maternal grandmother; Other (age of onset: 79) in her mother. There is no history of Colon cancer, Esophageal cancer, or Pancreatic cancer.  ROS:   Please see the history of present illness.    *** All other systems reviewed and are negative.  EKGs/Labs/Other Studies Reviewed:    The following studies were reviewed today: ***      Recent Labs: 12/29/2023: ALT 13; BUN 12; Creatinine, Ser 0.53; Hemoglobin 13.4; Platelets 286.0; Potassium 3.4; Sodium 137; TSH 2.55  Recent Lipid Panel    Component Value Date/Time   CHOL 257 (H) 12/29/2023 1116   TRIG 110.0 12/29/2023 1116   HDL 76.20 12/29/2023 1116   CHOLHDL 3 12/29/2023 1116   VLDL 22.0 12/29/2023 1116   LDLCALC 159 (H) 12/29/2023 1116     Risk Assessment/Calculations:   {Does this patient have ATRIAL FIBRILLATION?:[phone]}  No BP recorded.  {Refresh Note OR Click here to enter BP  :1}***         Physical Exam:    VS:  There were no vitals taken for this visit.    Wt Readings from Last 3 Encounters:  12/29/23 115 lb (52.2 kg)  01/14/23 115 lb (52.2 kg)  12/07/22 114 lb (51.7 kg)     GEN: *** Well nourished, well developed in no acute distress HEENT: Normal NECK: No JVD; No carotid bruits LYMPHATICS: No lymphadenopathy CARDIAC: ***RRR, no murmurs, rubs, gallops RESPIRATORY:  Clear to auscultation without rales, wheezing or rhonchi  ABDOMEN: Soft, non-tender, non-distended MUSCULOSKELETAL:  No edema; No deformity  SKIN: Warm and dry NEUROLOGIC:  Alert and oriented x 3 PSYCHIATRIC:  Normal affect   ASSESSMENT:    No diagnosis  found. PLAN:    In order of problems listed above:  ***      {Are you ordering a CV Procedure (e.g. stress test, cath, DCCV, TEE, etc)?   Press F2        :[id number]}    Medication Adjustments/Labs and Tests Ordered: Current medicines are reviewed at length with the patient today.  Concerns regarding medicines are outlined above.  No orders of the defined types were placed in this encounter.  No orders of the defined types were placed in this encounter.   There are no Patient Instructions on file for this visit.   Signed, Savina Olshefski [place],  MD  02/02/2024 2:59 PM    [facility]

## 2024-02-03 ENCOUNTER — Telehealth: Payer: Self-pay

## 2024-02-03 ENCOUNTER — Ambulatory Visit: Payer: PPO | Admitting: Cardiology

## 2024-02-03 NOTE — Telephone Encounter (Signed)
 Spoke to patient Dr.Jordan advised you need to see EP not him today.Advised recent monitor revealed slow heart beat.Need to see EP to talk about a possible pacemaker.Stated she has been having occasional dizziness.Appointment rescheduled with [facility] 3/31 at 1:45 pm.Advised to call sooner if dizziness worsens.

## 2024-02-09 ENCOUNTER — Encounter: Payer: Self-pay | Admitting: Internal Medicine

## 2024-02-09 ENCOUNTER — Ambulatory Visit: Payer: HMO | Admitting: Internal Medicine

## 2024-02-09 VITALS — BP 118/76 | HR 44 | Temp 98.6°F | Ht 63.0 in | Wt 117.0 lb

## 2024-02-09 DIAGNOSIS — I2510 Atherosclerotic heart disease of native coronary artery without angina pectoris: Secondary | ICD-10-CM

## 2024-02-09 DIAGNOSIS — E785 Hyperlipidemia, unspecified: Secondary | ICD-10-CM

## 2024-02-09 DIAGNOSIS — Z8673 Personal history of transient ischemic attack (TIA), and cerebral infarction without residual deficits: Secondary | ICD-10-CM

## 2024-02-09 DIAGNOSIS — R634 Abnormal weight loss: Secondary | ICD-10-CM

## 2024-02-09 DIAGNOSIS — I2583 Coronary atherosclerosis due to lipid rich plaque: Secondary | ICD-10-CM

## 2024-02-09 DIAGNOSIS — G459 Transient cerebral ischemic attack, unspecified: Secondary | ICD-10-CM

## 2024-02-09 DIAGNOSIS — Z789 Other specified health status: Secondary | ICD-10-CM | POA: Diagnosis not present

## 2024-02-09 DIAGNOSIS — I441 Atrioventricular block, second degree: Secondary | ICD-10-CM | POA: Insufficient documentation

## 2024-02-09 DIAGNOSIS — R21 Rash and other nonspecific skin eruption: Secondary | ICD-10-CM | POA: Diagnosis not present

## 2024-02-09 MED ORDER — PITAVASTATIN CALCIUM 1 MG PO TABS: 1.0000 mg | ORAL_TABLET | Freq: Every day | ORAL | 5 refills | Status: DC

## 2024-02-09 NOTE — Assessment & Plan Note (Signed)
 Statin intolerant (Simvastatin). Start Pitavastatin

## 2024-02-09 NOTE — Assessment & Plan Note (Signed)
No relapse 

## 2024-02-09 NOTE — Progress Notes (Signed)
 Subjective:  Patient ID: [place] Mckeough, female    DOB: 18-Oct-1946  Age: 78 y.o. MRN: [id number]  CC: No chief complaint on file.   HPI Carylon Tamburro presents for symptomatic 2-1 heart block - lightheaded <5 min at times. Cardiology appt is pending. No TIA sx's  Outpatient Medications Prior to Visit  Medication Sig Dispense Refill   acetaminophen (TYLENOL) 325 MG tablet Take 500 mg by mouth every 6 (six) hours as needed for mild pain or fever.      aspirin EC 81 MG tablet Take 1 tablet (81 mg total) by mouth daily. Swallow whole. 30 tablet 12   cholecalciferol (VITAMIN D) 1000 UNITS tablet Take 1,000 Units by mouth 2 (two) times daily.     famotidine (PEPCID) 10 MG tablet Take 10 mg by mouth daily.     FLUZONE HIGH-DOSE QUADRIVALENT 0.7 ML SUSY      loratadine (CLARITIN) 10 MG tablet Take 10 mg by mouth daily.     Melatonin 3 MG TABS Take 3 mg by mouth at bedtime as needed (for sleep).      Multiple Vitamins-Minerals (ICAPS) TABS Take 1 tablet by mouth 2 (two) times daily.     Omega-3 Fatty Acids (FISH OIL) 1000 MG CAPS Take 2 capsules (2,000 mg total) by mouth daily. 100 capsule 3   prednisoLONE acetate (PRED FORTE) 1 % ophthalmic suspension Place 2 drops into the left eye.     triamcinolone ointment (KENALOG) 0.5 % Apply 1 application. topically 2 (two) times daily. Apply to hand rash 60 g 3   No facility-administered medications prior to visit.    ROS: Review of Systems  Constitutional:  Negative for activity change, appetite change, chills, fatigue and unexpected weight change.  HENT:  Negative for congestion, mouth sores and sinus pressure.   Eyes:  Negative for visual disturbance.  Respiratory:  Negative for cough and chest tightness.   Gastrointestinal:  Negative for abdominal pain and nausea.  Genitourinary:  Negative for difficulty urinating, frequency and vaginal pain.  Musculoskeletal:  Negative for back pain and gait problem.  Skin:  Negative for pallor and  rash.  Neurological:  Negative for dizziness, tremors, weakness, numbness and headaches.  Hematological:  Bruises/bleeds easily.  Psychiatric/Behavioral:  Negative for confusion, sleep disturbance and suicidal ideas. The patient is not nervous/anxious.     Objective:  BP 118/76   Pulse (!) 44   Temp 98.6 F (37 C) (Oral)   Ht 5\' 3"  (1.6 m)   Wt 117 lb (53.1 kg)   SpO2 94%   BMI 20.73 kg/m   BP Readings from Last 3 Encounters:  02/09/24 118/76  12/29/23 134/82  01/14/23 130/86    Wt Readings from Last 3 Encounters:  02/09/24 117 lb (53.1 kg)  12/29/23 115 lb (52.2 kg)  01/14/23 115 lb (52.2 kg)    Physical Exam Constitutional:      General: She is not in acute distress.    Appearance: Normal appearance. She is well-developed.  HENT:     Head: Normocephalic.     Right Ear: External ear normal.     Left Ear: External ear normal.     Nose: Nose normal.  Eyes:     General:        Right eye: No discharge.        Left eye: No discharge.     Conjunctiva/sclera: Conjunctivae normal.     Pupils: Pupils are equal, round, and reactive to light.  Neck:  Thyroid: No thyromegaly.     Vascular: No JVD.     Trachea: No tracheal deviation.  Cardiovascular:     Rate and Rhythm: Normal rate and regular rhythm.     Heart sounds: Normal heart sounds.  Pulmonary:     Effort: No respiratory distress.     Breath sounds: No stridor. No wheezing.  Abdominal:     General: Bowel sounds are normal. There is no distension.     Palpations: Abdomen is soft. There is no mass.     Tenderness: There is no abdominal tenderness. There is no guarding or rebound.  Musculoskeletal:        General: No tenderness.     Cervical back: Normal range of motion and neck supple. No rigidity.  Lymphadenopathy:     Cervical: No cervical adenopathy.  Skin:    Findings: No erythema or rash.  Neurological:     Cranial Nerves: No cranial nerve deficit.     Motor: No abnormal muscle tone.      Coordination: Coordination normal.     Deep Tendon Reflexes: Reflexes normal.  Psychiatric:        Behavior: Behavior normal.        Thought Content: Thought content normal.        Judgment: Judgment normal.   L hip 2 cm macule Small bruises    A total time of 45 minutes was spent preparing to see the patient, reviewing labs, x-rays, test reports and other medical records.  Also, obtaining history and performing comprehensive physical exam.  Additionally, counseling the patient regarding the above listed issues - heart block.   Finally, documenting clinical information in the health records, coordination of care, educating the patient.    Lab Results  Component Value Date   WBC 5.7 12/29/2023   HGB 13.4 12/29/2023   HCT 40.7 12/29/2023   PLT 286.0 12/29/2023   GLUCOSE 86 12/29/2023   CHOL 257 (H) 12/29/2023   TRIG 110.0 12/29/2023   HDL 76.20 12/29/2023   LDLCALC 159 (H) 12/29/2023   ALT 13 12/29/2023   AST 19 12/29/2023   NA 137 12/29/2023   K 3.4 (L) 12/29/2023   CL 100 12/29/2023   CREATININE 0.53 12/29/2023   BUN 12 12/29/2023   CO2 26 12/29/2023   TSH 2.55 12/29/2023   INR 1.41 08/07/2015    CT HEAD WO CONTRAST ( ) Result Date: 01/17/2024 CLINICAL DATA:  Neuro deficit, acute, stroke suspected.  Remote TIA. EXAM: CT HEAD WITHOUT CONTRAST TECHNIQUE: Contiguous axial images were obtained from the base of the skull through the vertex without intravenous contrast. RADIATION DOSE REDUCTION: This exam was performed according to the departmental dose-optimization program which includes automated exposure control, adjustment of the mA and/or kV according to patient size and/or use of iterative reconstruction technique. COMPARISON:  None Available. FINDINGS: Brain: There is no evidence of an acute infarct, intracranial hemorrhage, mass, midline shift, or extra-axial fluid collection. There is mild cerebral atrophy. Patchy hypodensities in the cerebral white matter are nonspecific  but compatible with mild chronic small vessel ischemic disease. Vascular: Calcified atherosclerosis at the skull base. No hyperdense vessel. Skull: No acute fracture or suspicious osseous lesion. Sinuses/Orbits: The included paranasal sinuses and mastoid air cells are well aerated. The included portions of the orbits are unremarkable. Other: None. IMPRESSION: 1. No evidence of acute intracranial abnormality. 2. Mild chronic small vessel ischemic disease. Electronically Signed   By: Sebastian Ache M.D.   On: 01/17/2024 13:25  Assessment & Plan:   Problem List Items Addressed This Visit     Rash and nonspecific skin eruption (Chronic)   ?urticaria - new ?etiology On Claritin S/p Derm eval Consider singulair      Dyslipidemia   Statin intolerant (Simvastatin). Start Pitavastatin      Relevant Medications   Pitavastatin Calcium 1 MG TABS   Weight loss   No relapse      CAD (coronary artery disease)    Simvastatin - pt stopped, feeling better, no joint pain now      Relevant Medications   Pitavastatin Calcium 1 MG TABS   TIA (transient ischemic attack)   Possible. No relapse Brain CT w/a mild chronic small vessel ischemic disease On ASA Statin intolerant (Simvastatin). Start Pitavastatin      Relevant Medications   Pitavastatin Calcium 1 MG TABS   Heart block AV second degree - Primary   New (months) symptomatic 2-1 heart block - lightheaded <5 min at times. Cardiology appt w/Dr Elberta Fortis is pending. No LOC      Relevant Medications   Pitavastatin Calcium 1 MG TABS   Statin intolerance   Statin intolerant (Simvastatin). Start Pitavastatin         Meds ordered this encounter  Medications   Pitavastatin Calcium 1 MG TABS    Sig: Take 1 tablet (1 mg total) by mouth daily.    Dispense:  30 tablet    Refill:  5      Follow-up: Return in about 3 months (around 05/11/2024) for a follow-up visit.  Sonda Primes, MD

## 2024-02-09 NOTE — Assessment & Plan Note (Signed)
?  urticaria - new ?etiology On Claritin S/p Derm eval Consider singulair

## 2024-02-09 NOTE — Assessment & Plan Note (Signed)
 New (months) symptomatic 2-1 heart block - lightheaded <5 min at times. Cardiology appt w/Dr Elberta Fortis is pending. No LOC

## 2024-02-09 NOTE — Assessment & Plan Note (Signed)
 Simvastatin - pt stopped, feeling better, no joint pain now

## 2024-02-09 NOTE — Assessment & Plan Note (Addendum)
 Possible. No relapse Brain CT w/a mild chronic small vessel ischemic disease On ASA Statin intolerant (Simvastatin). Start Pitavastatin

## 2024-02-20 ENCOUNTER — Encounter (INDEPENDENT_AMBULATORY_CARE_PROVIDER_SITE_OTHER): Payer: PPO | Admitting: Ophthalmology

## 2024-02-20 DIAGNOSIS — H353112 Nonexudative age-related macular degeneration, right eye, intermediate dry stage: Secondary | ICD-10-CM | POA: Diagnosis not present

## 2024-02-20 DIAGNOSIS — I10 Essential (primary) hypertension: Secondary | ICD-10-CM

## 2024-02-20 DIAGNOSIS — H43813 Vitreous degeneration, bilateral: Secondary | ICD-10-CM | POA: Diagnosis not present

## 2024-02-20 DIAGNOSIS — H35033 Hypertensive retinopathy, bilateral: Secondary | ICD-10-CM | POA: Diagnosis not present

## 2024-02-20 DIAGNOSIS — H353221 Exudative age-related macular degeneration, left eye, with active choroidal neovascularization: Secondary | ICD-10-CM

## 2024-02-21 ENCOUNTER — Encounter (INDEPENDENT_AMBULATORY_CARE_PROVIDER_SITE_OTHER): Payer: PPO | Admitting: Ophthalmology

## 2024-03-05 ENCOUNTER — Ambulatory Visit: Payer: HMO | Attending: Cardiology | Admitting: Cardiology

## 2024-03-05 ENCOUNTER — Encounter: Payer: Self-pay | Admitting: Cardiology

## 2024-03-05 VITALS — BP 150/90 | HR 62 | Ht 63.0 in | Wt 118.6 lb

## 2024-03-05 DIAGNOSIS — I441 Atrioventricular block, second degree: Secondary | ICD-10-CM | POA: Diagnosis not present

## 2024-03-05 DIAGNOSIS — I4819 Other persistent atrial fibrillation: Secondary | ICD-10-CM

## 2024-03-05 NOTE — Progress Notes (Signed)
 Electrophysiology Office Note:   Date:  03/05/2024  ID:  Vicki [name], DOB 23-Jul-1946, MRN [id number]  Primary Cardiologist: None Primary Heart Failure: None Electrophysiologist: Dwana Garin Jorja Loa, MD      History of Present Illness:   Vicki [name] is a 78 y.o. female with h/o atrial fibrillation, hyperlipidemia, aortic insufficiency seen today for routine electrophysiology followup.   She presented to her primary physician in 2-1 AV block.  She was having episodes of lightheadedness that usually occurred for less than 5 minutes at a time.  She did wear a cardiac monitor which again confirmed 2-1 heart block.  Since last being seen in our clinic the patient reports doing overall well.  She does have intermittent episodes of palpitations and fatigue with shortness of breath.  She has not had any further episodes of syncope or near syncope.  She felt quite poorly at the end of last year.  she denies chest pain, palpitations, dyspnea, PND, orthopnea, nausea, vomiting, dizziness, syncope, edema, weight gain, or early satiety.   Review of systems complete and found to be negative unless listed in HPI.   EP Information / Studies Reviewed:    EKG is ordered today. Personal review as below.  EKG Interpretation Date/Time:  Monday March 05 2024 13:57:30 EDT Ventricular Rate:  62 PR Interval:  166 QRS Duration:  146 QT Interval:  458 QTC Calculation: 464 R Axis:   -64  Text Interpretation: Normal sinus rhythm Left axis deviation Left bundle branch block When compared with ECG of 14-Jan-2020 05:23, No significant change since last tracing Confirmed by Parveen Freehling ([id number]) on 03/05/2024 2:02:30 PM     Risk Assessment/Calculations:    CHA2DS2-VASc Score = 4   This indicates a 4.8% annual risk of stroke. The patient's score is based upon: CHF History: 0 HTN History: 0 Diabetes History: 0 Stroke History: 0 Vascular Disease History: 1 Age Score: 2 Gender Score: 1             Physical Exam:   VS:  BP (!) 150/90 (BP Location: Right Arm, Patient Position: Sitting, Cuff Size: Normal)   Pulse 62   Ht 5\' 3"  (1.6 m)   Wt 118 lb 9.6 oz (53.8 kg)   SpO2 93%   BMI 21.01 kg/m    Wt Readings from Last 3 Encounters:  03/05/24 118 lb 9.6 oz (53.8 kg)  02/09/24 117 lb (53.1 kg)  12/29/23 115 lb (52.2 kg)     GEN: Well nourished, well developed in no acute distress NECK: No JVD; No carotid bruits CARDIAC: Regular rate and rhythm, no murmurs, rubs, gallops RESPIRATORY:  Clear to auscultation without rales, wheezing or rhonchi  ABDOMEN: Soft, non-tender, non-distended EXTREMITIES:  No edema; No deformity   ASSESSMENT AND PLAN:    1.  Persistent atrial fibrillation: Post ablation 6-17.  She has had no further episodes of atrial fibrillation.  Her Xarelto has been stopped.  No further episodes.  Continue with current management.  2.  2-1 second-degree AV block: Was found by her primary physician and confirmed on cardiac monitor.  She has symptoms of lightheadedness.  She is on no rate controlling medications.  Due to this, she would benefit from pacemaker implant.  Risk and benefits have been discussed.  She understands the risks and is agreed to the procedure.  Explained risks, benefits, and alternatives to PPM implantation, including but not limited to bleeding, infection, pneumothorax, pericardial effusion, lead dislodgement, heart attack, stroke, or death.  Pt verbalized understanding  and agrees to proceed.  Follow up with Dr. Elberta Fortis as usual post procedure  Signed, Brynnlie Unterreiner Jorja Loa, MD

## 2024-03-05 NOTE — Patient Instructions (Signed)
 Medication Instructions:  Your physician recommends that you continue on your current medications as directed. Please refer to the Current Medication list given to you today.     * If you need a refill on your cardiac medications before your next appointment, please call your pharmacy. *   Labwork: Your physician recommends that you return for pre procedure lab work on: __________________  * Will notify you of abnormal results, otherwise continue current treatment plan.*   Testing/Procedures: Your physician has recommended that you have a pacemaker inserted. A pacemaker is a small device that is placed under the skin of your chest or abdomen to help control abnormal heart rhythms. This device uses electrical pulses to prompt the heart to beat at a normal rate. Pacemakers are used to treat heart rhythms that are too slow. Wire (leads) are attached to the pacemaker that goes into the chambers of you heart. This is done in the hospital and usually requires and overnight stay.  Please call the office to schedule when you determine your husbands procedure date.   Follow-Up: You will be scheduled for a 2 week wound check and 3 month physician check.  * Please note that any paperwork needing to be filled out by the provider will need to be addressed at the front desk prior to seeing the provider.  Please note that any FMLA, disability or other documents regarding health condition is subject to a $25.00 charge that must be received prior to completion of paperwork in the form of a money order or check. *  Thank you for choosing [facility]!!   Dory Horn, RN [phone]   Other Instructions   Pacemaker Implantation, Adult Pacemaker implantation is a procedure to place a pacemaker inside your chest. A pacemaker is a small computer that sends electrical signals to the heart and helps your heart beat normally. A pacemaker also stores information about your heart rhythms. You may need  pacemaker implantation if you: Have a slow heartbeat (bradycardia). Faint (syncope). Have shortness of breath (dyspnea) due to heart problems.  The pacemaker attaches to your heart through a wire, called a lead. Sometimes just one lead is needed. Other times, there will be two leads. There are two types of pacemakers: Transvenous pacemaker. This type is placed under the skin or muscle of your chest. The lead goes through a vein in the chest area to reach the inside of the heart. Epicardial pacemaker. This type is placed under the skin or muscle of your chest or belly. The lead goes through your chest to the outside of the heart.  Tell a health care provider about: Any allergies you have. All medicines you are taking, including vitamins, herbs, eye drops, creams, and over-the-counter medicines. Any problems you or family members have had with anesthetic medicines. Any blood or bone disorders you have. Any surgeries you have had. Any medical conditions you have. Whether you are pregnant or may be pregnant. What are the risks? Generally, this is a safe procedure. However, problems may occur, including: Infection. Bleeding. Failure of the pacemaker or the lead. Collapse of a lung or bleeding into a lung. Blood clot inside a blood vessel with a lead. Damage to the heart. Infection inside the heart (endocarditis). Allergic reactions to medicines.  What happens before the procedure? Staying hydrated Follow instructions from your health care provider about hydration, which may include: Up to 2 hours before the procedure - you may continue to drink clear liquids, such as water, clear fruit  juice, black coffee, and plain tea.  Eating and drinking restrictions Follow instructions from your health care provider about eating and drinking, which may include: 8 hours before the procedure - stop eating heavy meals or foods such as meat, fried foods, or fatty foods. 6 hours before the procedure -  stop eating light meals or foods, such as toast or cereal. 6 hours before the procedure - stop drinking milk or drinks that contain milk. 2 hours before the procedure - stop drinking clear liquids.  Medicines Ask your health care provider about: Changing or stopping your regular medicines. This is especially important if you are taking diabetes medicines or blood thinners. Taking medicines such as aspirin and ibuprofen. These medicines can thin your blood. Do not take these medicines before your procedure if your health care provider instructs you not to. You may be given antibiotic medicine to help prevent infection. General instructions You will have a heart evaluation. This may include an electrocardiogram (ECG), chest X-ray, and heart imaging (echocardiogram,  or echo) tests. You will have blood tests. Do not use any products that contain nicotine or tobacco, such as cigarettes and e-cigarettes. If you need help quitting, ask your health care provider. Plan to have someone take you home from the hospital or clinic. If you will be going home right after the procedure, plan to have someone with you for 24 hours. Ask your health care provider how your surgical site will be marked or identified. What happens during the procedure? To reduce your risk of infection: Your health care team will wash or sanitize their hands. Your skin will be washed with soap. Hair may be removed from the surgical area. An IV tube will be inserted into one of your veins. You will be given one or more of the following: A medicine to help you relax (sedative). A medicine to numb the area (local anesthetic). A medicine to make you fall asleep (general anesthetic). If you are getting a transvenous pacemaker: An incision will be made in your upper chest. A pocket will be made for the pacemaker. It may be placed under the skin or between layers of muscle. The lead will be inserted into a blood vessel that returns to  the heart. While X-rays are taken by an imaging machine (fluoroscopy), the lead will be advanced through the vein to the inside of your heart. The other end of the lead will be tunneled under the skin and attached to the pacemaker. If you are getting an epicardial pacemaker: An incision will be made near your ribs or breastbone (sternum) for the lead. The lead will be attached to the outside of your heart. Another incision will be made in your chest or upper belly to create a pocket for the pacemaker. The free end of the lead will be tunneled under the skin and attached to the pacemaker. The transvenous or epicardial pacemaker will be tested. Imaging studies may be done to check the lead position. The incisions will be closed with stitches (sutures), adhesive strips, or skin glue. Bandages (dressing) will be placed over the incisions. The procedure may vary among health care providers and hospitals. What happens after the procedure? Your blood pressure, heart rate, breathing rate, and blood oxygen level will be monitored until the medicines you were given have worn off. You will be given antibiotics and pain medicine. ECG and chest x-rays will be done. You will wear a continuous type of ECG (Holter monitor) to check your heart rhythm. Your  health care provider will program the pacemaker. Do not drive for 24 hours if you received a sedative. This information is not intended to replace advice given to you by your health care provider. Make sure you discuss any questions you have with your health care provider. Document Released: 11/12/2002 Document Revised: 06/11/2016 Document Reviewed: 05/05/2016 Elsevier Interactive Patient Education  2018 [employer].     Pacemaker Implantation, Adult, Care After This sheet gives you information about how to care for yourself after your procedure. Your health care provider may also give you more specific instructions. If you have problems or questions,  contact your health care provider. What can I expect after the procedure? After the procedure, it is common to have: Mild pain. Slight bruising. Some swelling over the incision. A slight bump over the skin where the device was placed. Sometimes, it is possible to feel the device under the skin. This is normal.  Follow these instructions at home: Medicines Take over-the-counter and prescription medicines only as told by your health care provider. If you were prescribed an antibiotic medicine, take it as told by your health care provider. Do not stop taking the antibiotic even if you start to feel better. Wound care Do not remove the bandage on your chest until directed to do so by your health care provider. After your bandage is removed, you may see pieces of tape called skin adhesive strips over the area where the cut was made (incision site). Let them fall off on their own. Check the incision site every day to make sure it is not infected, bleeding, or starting to pull apart. Do not use lotions or ointments near the incision site unless directed to do so. Keep the incision area clean and dry for 2-3 days after the procedure or as directed by your health care provider. It takes several weeks for the incision site to completely heal. Do not take baths, swim, or use a hot tub for 7-10 days or as otherwise directed by your health care provider. Activity Do not drive or use heavy machinery while taking prescription pain medicine. Do not drive for 24 hours if you were given a medicine to help you relax (sedative). Check with your health care provider before you start to drive or play sports. Avoid sudden jerking, pulling, or chopping movements that pull your upper arm far away from your body. Avoid these movements for at least 6 weeks or as long as told by your health care provider. Do not lift your upper arm above your shoulders for at least 6 weeks or as long as told by your health care  provider. This means no tennis, golf, or swimming. You may go back to work when your health care provider says it is okay. Pacemaker care You may be shown how to transfer data from your pacemaker through the phone to your health care provider. Always let all health care providers know about your pacemaker before you have any medical procedures or tests. Wear a medical ID bracelet or necklace stating that you have a pacemaker. Carry a pacemaker ID card with you at all times. Your pacemaker battery will last for 5-15 years. Routine checks by your health care provider will let the health care provider know when the battery is starting to run down. The pacemaker will need to be replaced when the battery starts to run down. Do not use amateur Proofreader. Other electrical devices are safe to use, including power tools,  lawn mowers, and speakers. If you are unsure of whether something is safe to use, ask your health care provider. When using your cell phone, hold it to the ear opposite the pacemaker. Do not leave your cell phone in a pocket over the pacemaker. Avoid places or objects that have a strong electric or magnetic field, including: Scientist, physiological. When at the airport, let officials know that you have a pacemaker. Power plants. Large electrical generators. Radiofrequency transmission towers, such as cell phone and radio towers. General instructions Weigh yourself every day. If you suddenly gain weight, fluid may be building up in your body. Keep all follow-up visits as told by your health care provider. This is important. Contact a health care provider if: You gain weight suddenly. Your legs or feet swell. It feels like your heart is fluttering or skipping beats (heart palpitations). You have chills or a fever. You have more redness, swelling, or pain around your incisions. You have more fluid or blood coming from your incisions. Your incisions feel  warm to the touch. You have pus or a bad smell coming from your incisions. Get help right away if: You have chest pain. You have trouble breathing or are short of breath. You become extremely tired. You are light-headed or you faint. This information is not intended to replace advice given to you by your health care provider. Make sure you discuss any questions you have with your health care provider. Document Released: 06/11/2005 Document Revised: 09/03/2016 Document Reviewed: 09/03/2016 Elsevier Interactive Patient Education  2018 [employer].    Supplemental Discharge Instructions for  Pacemaker/Defibrillator Patients  ACTIVITY No heavy lifting or vigorous activity with your left/right arm for 6 to 8 weeks.  Do not raise your left/right arm above your head for one week.  Gradually raise your affected arm as drawn below.           __  NO DRIVING for     ; you may begin driving on     .  WOUND CARE Keep the wound area clean and dry.  Do not get this area wet for one week. No showers for one week; you may shower on     . The tape/steri-strips on your wound will fall off; do not pull them off.  No bandage is needed on the site.  DO  NOT apply any creams, oils, or ointments to the wound area. If you notice any drainage or discharge from the wound, any swelling or bruising at the site, or you develop a fever > 101? F after you are discharged home, call the office at once.  SPECIAL INSTRUCTIONS You are still able to use cellular telephones; use the ear opposite the side where you have your pacemaker/defibrillator.  Avoid carrying your cellular phone near your device. When traveling through airports, show security personnel your identification card to avoid being screened in the metal detectors.  Ask the security personnel to use the hand wand. Avoid arc welding equipment, MRI testing (magnetic resonance imaging), TENS units (transcutaneous nerve stimulators).  Call the office for  questions about other devices. Avoid electrical appliances that are in poor condition or are not properly grounded. Microwave ovens are safe to be near or to operate.  ADDITIONAL INFORMATION FOR DEFIBRILLATOR PATIENTS SHOULD YOUR DEVICE GO OFF: If your device goes off ONCE and you feel fine afterward, notify the device clinic nurses. If your device goes off ONCE and you do not feel well afterward, call 911. If your  device goes off TWICE, call 911. If your device goes off THREE TIMES IN ONE DAY, call 911.  DO NOT DRIVE YOURSELF OR A FAMILY MEMBER WITH A DEFIBRILLATOR TO THE HOSPITAL--CALL 911.

## 2024-03-08 ENCOUNTER — Telehealth: Payer: Self-pay | Admitting: Cardiology

## 2024-03-08 NOTE — Telephone Encounter (Signed)
 Pt called in to get pacemaker implant set up.

## 2024-03-09 NOTE — Telephone Encounter (Signed)
 Pt would like to schedule PPM implant for 5/1. Aware procedure scheduler would be in touch to go over instructions. Patient verbalized understanding and agreeable to plan.

## 2024-03-09 NOTE — Telephone Encounter (Signed)
 Patient called again to follow-up on getting pacemaker implant scheduled.

## 2024-03-13 ENCOUNTER — Other Ambulatory Visit (HOSPITAL_COMMUNITY): Payer: Self-pay

## 2024-03-13 ENCOUNTER — Telehealth (HOSPITAL_COMMUNITY): Payer: Self-pay

## 2024-03-13 DIAGNOSIS — I4819 Other persistent atrial fibrillation: Secondary | ICD-10-CM

## 2024-03-13 DIAGNOSIS — Z01812 Encounter for preprocedural laboratory examination: Secondary | ICD-10-CM

## 2024-03-13 DIAGNOSIS — I441 Atrioventricular block, second degree: Secondary | ICD-10-CM

## 2024-03-13 NOTE — Telephone Encounter (Signed)
 Attempted to reach patient to discuss upcoming procedure, no answer. Left VM for patient to return call.

## 2024-03-13 NOTE — Addendum Note (Signed)
 Addended by: Primitivo Gauze on: 03/13/2024 12:17 PM   Modules accepted: Orders

## 2024-03-13 NOTE — Telephone Encounter (Signed)
 Spoke with patient to complete pre-procedure call.     New medical conditions? No   Recent hospitalizations or surgeries? No Started any new medications? Pitavastatin Patient made aware to contact office to inform of any new medications started. Any changes in activities of daily living? No  Pre-procedure testing scheduled: lab work ordered Confirmed patient is scheduled for Permanent transvenous pacemaker (PPM) on Thursday, May 1 with Dr. Loman Brooklyn. Instructed patient to arrive at the Main Entrance A at [facility]: [address] [place], [place] [address] and check in at Admitting at 1:00 PM  Advised of plan to go home the same day and will only stay overnight if medically necessary. You MUST have a responsible adult to drive you home and MUST be with you the first 24 hours after you arrive home or your procedure could be cancelled.  Patient verbalized understanding to information provided and is agreeable to proceed with procedure.

## 2024-03-14 ENCOUNTER — Telehealth: Payer: Self-pay

## 2024-03-14 NOTE — Telephone Encounter (Signed)
 Patient returned RN's call.

## 2024-03-14 NOTE — Telephone Encounter (Signed)
 Left message per DPR that instructions was under letters but we would be happy to go over them if pt would like to call us back.

## 2024-03-15 NOTE — Telephone Encounter (Signed)
 Left message

## 2024-03-15 NOTE — Telephone Encounter (Signed)
 Spoke to pt via direct call from scheduling. Instructions went over with pt. Scrub and letter left at front for pt.

## 2024-03-16 ENCOUNTER — Ambulatory Visit
Admission: RE | Admit: 2024-03-16 | Discharge: 2024-03-16 | Disposition: A | Discharge status: Home / Self Care | Payer: HMO | Source: Ambulatory Visit | Attending: Internal Medicine | Admitting: Internal Medicine

## 2024-03-16 DIAGNOSIS — Z1231 Encounter for screening mammogram for malignant neoplasm of breast: Secondary | ICD-10-CM | POA: Diagnosis not present

## 2024-03-22 DIAGNOSIS — Z01812 Encounter for preprocedural laboratory examination: Secondary | ICD-10-CM | POA: Diagnosis not present

## 2024-03-22 DIAGNOSIS — I441 Atrioventricular block, second degree: Secondary | ICD-10-CM | POA: Diagnosis not present

## 2024-03-22 DIAGNOSIS — I4819 Other persistent atrial fibrillation: Secondary | ICD-10-CM | POA: Diagnosis not present

## 2024-03-23 LAB — BASIC METABOLIC PANEL WITH GFR
BUN/Creatinine Ratio: 17 (ref 12–28)
BUN: 10 mg/dL (ref 8–27)
CO2: 24 mmol/L (ref 20–29)
Calcium: 9.2 mg/dL (ref 8.7–10.3)
Chloride: 98 mmol/L (ref 96–106)
Creatinine, Ser: 0.59 mg/dL (ref 0.57–1.00)
Glucose: 79 mg/dL (ref 70–99)
Potassium: 4.6 mmol/L (ref 3.5–5.2)
Sodium: 136 mmol/L (ref 134–144)
eGFR: 93 mL/min/{1.73_m2} (ref >59)

## 2024-03-23 LAB — CBC
Hematocrit: 38.3 % (ref 34.0–46.6)
Hemoglobin: 13 g/dL (ref 11.1–15.9)
MCH: 31.3 pg (ref 26.6–33.0)
MCHC: 33.9 g/dL (ref 31.5–35.7)
MCV: 92 fL (ref 79–97)
Platelets: 293 10*3/uL (ref 150–450)
RBC: 4.15 x10E6/uL (ref 3.77–5.28)
RDW: 12.1 % (ref 11.7–15.4)
WBC: 5.5 10*3/uL (ref 3.4–10.8)

## 2024-03-30 NOTE — Telephone Encounter (Signed)
 Instruction letter has been discarded since patient hasn't picked it from our office.

## 2024-04-04 NOTE — Pre-Procedure Instructions (Signed)
 Instructed patient on the following items: Arrival time 1230 Nothing to eat or drink after midnight No meds AM of procedure Responsible person to drive you home and stay with you for 24 hrs Wash with special soap night before and morning of procedure If on anti-coagulant drug instructions held ASA 4/29 and 4/30

## 2024-04-05 ENCOUNTER — Encounter (HOSPITAL_COMMUNITY)
Admission: RE | Disposition: A | Discharge status: Home / Self Care | Payer: Self-pay | Source: Home / Self Care | Attending: Cardiology

## 2024-04-05 ENCOUNTER — Ambulatory Visit (HOSPITAL_COMMUNITY)

## 2024-04-05 ENCOUNTER — Encounter: Payer: Self-pay | Admitting: Emergency Medicine

## 2024-04-05 ENCOUNTER — Ambulatory Visit (HOSPITAL_COMMUNITY)
Admission: RE | Admit: 2024-04-05 | Discharge: 2024-04-05 | Disposition: A | Discharge status: Home / Self Care | Attending: Cardiology | Admitting: Cardiology

## 2024-04-05 DIAGNOSIS — I351 Nonrheumatic aortic (valve) insufficiency: Secondary | ICD-10-CM | POA: Insufficient documentation

## 2024-04-05 DIAGNOSIS — E785 Hyperlipidemia, unspecified: Secondary | ICD-10-CM | POA: Diagnosis not present

## 2024-04-05 DIAGNOSIS — I4819 Other persistent atrial fibrillation: Secondary | ICD-10-CM | POA: Insufficient documentation

## 2024-04-05 DIAGNOSIS — I441 Atrioventricular block, second degree: Secondary | ICD-10-CM | POA: Insufficient documentation

## 2024-04-05 DIAGNOSIS — Z79899 Other long term (current) drug therapy: Secondary | ICD-10-CM | POA: Insufficient documentation

## 2024-04-05 DIAGNOSIS — Z950 Presence of cardiac pacemaker: Secondary | ICD-10-CM | POA: Diagnosis not present

## 2024-04-05 HISTORY — PX: PACEMAKER IMPLANT: EP1218

## 2024-04-05 SURGERY — PACEMAKER IMPLANT

## 2024-04-05 MED ORDER — MIDAZOLAM HCL 5 MG/5ML IJ SOLN
INTRAMUSCULAR | Status: DC | PRN
  Administered 2024-04-05 (×4): 1 mg via INTRAVENOUS

## 2024-04-05 MED ORDER — ONDANSETRON HCL 4 MG/2ML IJ SOLN: 4.0000 mg | Freq: Four times a day (QID) | INTRAMUSCULAR | Status: DC | PRN

## 2024-04-05 MED ORDER — POVIDONE-IODINE 10 % EX SWAB
2.0000 | Freq: Once | CUTANEOUS | Status: AC
  Administered 2024-04-05: 2 via TOPICAL

## 2024-04-05 MED ORDER — SODIUM CHLORIDE 0.9 % IV SOLN
80.0000 mg | INTRAVENOUS | Status: AC
  Administered 2024-04-05: 80 mg

## 2024-04-05 MED ORDER — ACETAMINOPHEN 325 MG PO TABS
325.0000 mg | ORAL_TABLET | ORAL | Status: DC | PRN
  Administered 2024-04-05: 650 mg via ORAL
  Filled 2024-04-05: qty 2

## 2024-04-05 MED ORDER — HEPARIN (PORCINE) IN NACL 1000-0.9 UT/500ML-% IV SOLN
INTRAVENOUS | Status: DC | PRN
  Administered 2024-04-05: 500 mL

## 2024-04-05 MED ORDER — FENTANYL CITRATE (PF) 100 MCG/2ML IJ SOLN
INTRAMUSCULAR | Status: DC | PRN
  Administered 2024-04-05 (×4): 25 ug via INTRAVENOUS

## 2024-04-05 MED ORDER — MIDAZOLAM HCL 2 MG/2ML IJ SOLN
INTRAMUSCULAR | Status: AC
  Filled 2024-04-05: qty 2

## 2024-04-05 MED ORDER — LIDOCAINE HCL (PF) 1 % IJ SOLN
INTRAMUSCULAR | Status: DC | PRN
  Administered 2024-04-05: 50 mL

## 2024-04-05 MED ORDER — SODIUM CHLORIDE 0.9 % IV SOLN: INTRAVENOUS | Status: DC

## 2024-04-05 MED ORDER — FENTANYL CITRATE (PF) 100 MCG/2ML IJ SOLN
INTRAMUSCULAR | Status: AC
  Filled 2024-04-05: qty 2

## 2024-04-05 MED ORDER — LIDOCAINE HCL (PF) 1 % IJ SOLN
INTRAMUSCULAR | Status: AC
  Filled 2024-04-05: qty 60

## 2024-04-05 MED ORDER — CHLORHEXIDINE GLUCONATE 4 % EX SOLN
4.0000 | Freq: Once | CUTANEOUS | Status: DC
  Filled 2024-04-05: qty 60

## 2024-04-05 MED ORDER — CEFAZOLIN SODIUM-DEXTROSE 2-4 GM/100ML-% IV SOLN
INTRAVENOUS | Status: AC
  Filled 2024-04-05: qty 100

## 2024-04-05 MED ORDER — CEFAZOLIN SODIUM-DEXTROSE 2-4 GM/100ML-% IV SOLN
2.0000 g | INTRAVENOUS | Status: AC
  Administered 2024-04-05: 2 g via INTRAVENOUS

## 2024-04-05 MED ORDER — SODIUM CHLORIDE 0.9 % IV SOLN
INTRAVENOUS | Status: AC
  Filled 2024-04-05: qty 2

## 2024-04-05 MED ORDER — SODIUM CHLORIDE 0.9 % IV SOLN
INTRAVENOUS | Status: DC | PRN
  Administered 2024-04-05: 10 mL/h via INTRAVENOUS

## 2024-04-05 SURGICAL SUPPLY — 13 items
CABLE SURGICAL S-101-97-12 (CABLE) ×2 IMPLANT
CATH CPS LOCATOR 3D SM (CATHETERS) IMPLANT
KIT MICROPUNCTURE NIT STIFF (SHEATH) IMPLANT
LEAD ULTIPACE 52 LPA1231/52 (Lead) IMPLANT
LEAD ULTIPACE 65 LPA1231/65 (Lead) IMPLANT
PACEMAKER ASSURITY DR-RF (Pacemaker) IMPLANT
PAD DEFIB RADIO PHYSIO CONN (PAD) ×2 IMPLANT
SHEATH 7FR PRELUDE SNAP 13 (SHEATH) IMPLANT
SHEATH 9FR PRELUDE SNAP 13 (SHEATH) IMPLANT
SLITTER AGILIS HISPRO (INSTRUMENTS) IMPLANT
TOOL HELIX LOCKING (MISCELLANEOUS) IMPLANT
TRAY PACEMAKER INSERTION (PACKS) ×2 IMPLANT
WIRE HI TORQ VERSACORE-J 145CM (WIRE) IMPLANT

## 2024-04-05 NOTE — H&P (Signed)
  Electrophysiology Office Note:   Date:  04/05/2024  ID:  Vicki [name], DOB Jun 07, 1946, MRN [id number]  Primary Cardiologist: None Primary Heart Failure: None Electrophysiologist: Melchizedek Espinola Cortland Ding, MD      History of Present Illness:   Vicki [name] is a 78 y.o. female with h/o atrial fibrillation, hyperlipidemia, aortic insufficiency seen today for routine electrophysiology followup.   She presented to her primary physician in 2-1 AV block.  She was having episodes of lightheadedness that usually occurred for less than 5 minutes at a time.  She did wear a cardiac monitor which again confirmed 2-1 heart block.  Today, denies symptoms of palpitations, chest pain, shortness of breath, orthopnea, PND, lower extremity edema, claudication, dizziness, presyncope, syncope, bleeding, or neurologic sequela. The patient is tolerating medications without difficulties. Plan pacemaker today.   EP Information / Studies Reviewed:    EKG is ordered today. Personal review as below.        Risk Assessment/Calculations:    CHA2DS2-VASc Score = 4   This indicates a 4.8% annual risk of stroke. The patient's score is based upon: CHF History: 0 HTN History: 0 Diabetes History: 0 Stroke History: 0 Vascular Disease History: 1 Age Score: 2 Gender Score: 1            Physical Exam:   VS:  Pulse 65   Temp 98.1 F (36.7 C) (Oral)   Resp 20   Ht 5\' 3"  (1.6 m)   Wt 51.3 kg   SpO2 97%   BMI 20.02 kg/m    Wt Readings from Last 3 Encounters:  04/05/24 51.3 kg  03/05/24 53.8 kg  02/09/24 53.1 kg    GEN: No acute distress.   Neck: No JVD Cardiac: RRR, no murmurs, rubs, or gallops.  Respiratory: normal BS bilaterally. GI: Soft, nontender, non-distended  MS: No edema; No deformity. Neuro:  Nonfocal  Skin: warm and dry Psych: Normal affect    ASSESSMENT AND PLAN:    1.  Persistent atrial fibrillation: Post ablation 6-17.  She has had no further episodes of atrial fibrillation.   Her Xarelto  has been stopped.  No further episodes.  Continue with current management.  2.  2-1 second-degree AV block: Vicki [name] has presented today for surgery, with the diagnosis of second degree AV block.  The various methods of treatment have been discussed with the patient and family. After consideration of risks, benefits and other options for treatment, the patient has consented to  Procedure(s): pacemaker implant as a surgical intervention .  Risks include but not limited to bleeding, infection, pneumothorax, perforation, tamponade, vascular damage, renal failure, MI, stroke, death, and lead dislodgement . The patient's history has been reviewed, patient examined, no change in status, stable for surgery.  I have reviewed the patient's chart and labs.  Questions were answered to the patient's satisfaction.    Vicki [name] [name], MD 04/05/2024 12:39 PM

## 2024-04-05 NOTE — Discharge Instructions (Signed)
 After Your Pacemaker   You have a [employer] Pacemaker  ACTIVITY Do not lift your arm above shoulder height for 1 week after your procedure. After 7 days, you may progress as below.  You should remove your sling 24 hours after your procedure, unless otherwise instructed by your provider.     Thursday Apr 12, 2024  Friday Apr 13, 2024 Saturday Apr 14, 2024 Sunday Apr 15, 2024   Do not lift, push, pull, or carry anything over 10 pounds with the affected arm until 6 weeks (Thursday May 17, 2024 ) after your procedure.   You may drive AFTER your wound check, unless you have been told otherwise by your provider.   Ask your healthcare provider when you can go back to work   INCISION/Dressing If you are on a blood thinner such as Coumadin, Xarelto , Eliquis, Plavix, or Pradaxa please confirm with your provider when this should be resumed.   If large square, outer bandage is left in place, this can be removed after 24 hours from your procedure. Do not remove steri-strips or glue as below.   If a PRESSURE DRESSING (a bulky dressing that usually goes up over your shoulder) was applied or left in place, please follow instructions given by your provider on when to return to have this removed.   Monitor your Pacemaker site for redness, swelling, and drainage. Call the device clinic at [phone] if you experience these symptoms or fever/chills.  If your incision is sealed with Steri-strips or staples, you may shower 7 days after your procedure or when told by your provider. Do not remove the steri-strips or let the shower hit directly on your site. You may wash around your site with soap and water.    If you were discharged in a sling, please do not wear this during the day more than 48 hours after your surgery unless otherwise instructed. This may increase the risk of stiffness and soreness in your shoulder.   Avoid lotions, ointments, or perfumes over your incision until it is well-healed.  You  may use a hot tub or a pool AFTER your wound check appointment if the incision is completely closed.  Pacemaker Alerts:  Some alerts are vibratory and others beep. These are NOT emergencies. Please call our office to let us  know. If this occurs at night or on weekends, it can wait until the next business day. Send a remote transmission.  If your device is capable of reading fluid status (for heart failure), you will be offered monthly monitoring to review this with you.   DEVICE MANAGEMENT Remote monitoring is used to monitor your pacemaker from home. This monitoring is scheduled every 91 days by our office. It allows us  to keep an eye on the functioning of your device to ensure it is working properly. You will routinely see your Electrophysiologist annually (more often if necessary).   You should receive your ID card for your new device in 4-8 weeks. Keep this card with you at all times once received. Consider wearing a medical alert bracelet or necklace.  Your Pacemaker may be MRI compatible. This will be discussed at your next office visit/wound check.  You should avoid contact with strong electric or magnetic fields.   Do not use amateur (ham) radio equipment or electric (arc) welding torches. MP3 player headphones with magnets should not be used. Some devices are safe to use if held at least 12 inches (30 cm) from your Pacemaker. These include power tools, lawn  mowers, and speakers. If you are unsure if something is safe to use, ask your health care provider.  When using your cell phone, hold it to the ear that is on the opposite side from the Pacemaker. Do not leave your cell phone in a pocket over the Pacemaker.  You may safely use electric blankets, heating pads, computers, and microwave ovens.  Call the office right away if: You have chest pain. You feel more short of breath than you have felt before. You feel more light-headed than you have felt before. Your incision starts to open  up.  This information is not intended to replace advice given to you by your health care provider. Make sure you discuss any questions you have with your health care provider.

## 2024-04-06 ENCOUNTER — Encounter (HOSPITAL_COMMUNITY): Payer: Self-pay | Admitting: Cardiology

## 2024-04-06 MED FILL — Midazolam HCl Inj 2 MG/2ML (Base Equivalent): INTRAMUSCULAR | Qty: 2 | Status: AC

## 2024-04-19 ENCOUNTER — Ambulatory Visit: Attending: Cardiovascular Disease

## 2024-04-19 DIAGNOSIS — I441 Atrioventricular block, second degree: Secondary | ICD-10-CM

## 2024-04-19 DIAGNOSIS — I4819 Other persistent atrial fibrillation: Secondary | ICD-10-CM

## 2024-04-19 NOTE — Patient Instructions (Signed)
   After Your Pacemaker   Monitor your pacemaker site for redness, swelling, and drainage. Call the device clinic at [phone] if you experience these symptoms or fever/chills.  Your incision was closed with Steri-strips or staples:  You may shower 7 days after your procedure and wash your incision with soap and water. Avoid lotions, ointments, or perfumes over your incision until it is well-healed.  You may use a hot tub or a pool after your wound check appointment if the incision is completely closed.  Do not lift, push or pull greater than 10 pounds with the affected arm until 6 weeks after your procedure. UNTIL AFTER JUNE 12TH.  There are no other restrictions in arm movement after your wound check appointment.  You may drive, unless driving has been restricted by your healthcare providers.   Remote monitoring is used to monitor your pacemaker from home. This monitoring is scheduled every 91 days by our office. It allows [place] to keep an eye on the functioning of your device to ensure it is working properly. You will routinely see your Electrophysiologist annually (more often if necessary).

## 2024-04-19 NOTE — Progress Notes (Signed)
 Normal DUAL chamber pacemaker wound check. Presenting rhythm: AP/VS 60. Wound well healed.  There is small amount of swelling at device site that is healing.  No signs of infection.  Patient is on ASA, no OAC.  She will monitor and if does not continue to improve or begins to get bigger, she will call office.  Okay to apply cold compress as needed.   Routine testing performed. Thresholds, sensing, and impedances consistent with implant measurements and at 3.5V safety margin/auto capture until 3 month visit. No episodes. Reviewed arm restrictions to continue for 6 weeks total post op.  Pt enrolled in remote follow-up.

## 2024-05-08 ENCOUNTER — Encounter (INDEPENDENT_AMBULATORY_CARE_PROVIDER_SITE_OTHER): Admitting: Ophthalmology

## 2024-05-08 DIAGNOSIS — H353112 Nonexudative age-related macular degeneration, right eye, intermediate dry stage: Secondary | ICD-10-CM | POA: Diagnosis not present

## 2024-05-08 DIAGNOSIS — H43813 Vitreous degeneration, bilateral: Secondary | ICD-10-CM

## 2024-05-08 DIAGNOSIS — I10 Essential (primary) hypertension: Secondary | ICD-10-CM | POA: Diagnosis not present

## 2024-05-08 DIAGNOSIS — H353221 Exudative age-related macular degeneration, left eye, with active choroidal neovascularization: Secondary | ICD-10-CM | POA: Diagnosis not present

## 2024-05-08 DIAGNOSIS — H35033 Hypertensive retinopathy, bilateral: Secondary | ICD-10-CM | POA: Diagnosis not present

## 2024-05-14 ENCOUNTER — Ambulatory Visit (INDEPENDENT_AMBULATORY_CARE_PROVIDER_SITE_OTHER): Admitting: Internal Medicine

## 2024-05-14 ENCOUNTER — Encounter: Payer: Self-pay | Admitting: Internal Medicine

## 2024-05-14 VITALS — BP 118/80 | HR 59 | Temp 98.2°F | Ht 63.0 in | Wt 112.0 lb

## 2024-05-14 DIAGNOSIS — I2583 Coronary atherosclerosis due to lipid rich plaque: Secondary | ICD-10-CM

## 2024-05-14 DIAGNOSIS — E785 Hyperlipidemia, unspecified: Secondary | ICD-10-CM

## 2024-05-14 DIAGNOSIS — F411 Generalized anxiety disorder: Secondary | ICD-10-CM

## 2024-05-14 DIAGNOSIS — I2510 Atherosclerotic heart disease of native coronary artery without angina pectoris: Secondary | ICD-10-CM

## 2024-05-14 DIAGNOSIS — I441 Atrioventricular block, second degree: Secondary | ICD-10-CM

## 2024-05-14 NOTE — Assessment & Plan Note (Signed)
 On Pitavastatin  and ASA

## 2024-05-14 NOTE — Assessment & Plan Note (Signed)
 2025 S/p pacemaker placement

## 2024-05-14 NOTE — Assessment & Plan Note (Signed)
 Simvastatin  - pt stopped, feeling better, no joint pain now On Pitavastatin  and ASA

## 2024-05-14 NOTE — Assessment & Plan Note (Signed)
 Stable

## 2024-05-14 NOTE — Progress Notes (Signed)
 Subjective:  Patient ID: Vicki [name], female    DOB: 06/13/1946  Age: 78 y.o. MRN: [id number]  CC: Medical Management of Chronic Issues (3 mnth f/u)   HPI Vicki [name] presents for a pacemaker for 2-1 AV block. She was having episodes of lightheadedness. Doing well now. F/u on CAD, dyslipidemia. On Pitavastatin  and ASA  Outpatient Medications Prior to Visit  Medication Sig Dispense Refill   acetaminophen  (TYLENOL ) 650 MG CR tablet Take 1,300 mg by mouth every 8 (eight) hours as needed for pain.     aspirin  EC 81 MG tablet Take 1 tablet (81 mg total) by mouth daily. Swallow whole. 30 tablet 12   cholecalciferol  (VITAMIN D ) 1000 UNITS tablet Take 1,000 Units by mouth 2 (two) times daily.     loratadine  (CLARITIN ) 10 MG tablet Take 10 mg by mouth daily.     Melatonin 3 MG TABS Take 3 mg by mouth at bedtime.     Multiple Vitamins-Minerals (PRESERVISION AREDS 2) CAPS Take 1 capsule by mouth 2 (two) times daily.     Omega-3 Fatty Acids (FISH OIL  ULTRA) 1400 MG CAPS Take 1,400 mg by mouth 2 (two) times daily.     Pitavastatin  Calcium  1 MG TABS Take 1 tablet (1 mg total) by mouth daily. 30 tablet 5   prednisoLONE acetate (PRED FORTE) 1 % ophthalmic suspension Place 3 drops into the left eye See admin instructions. Instill 3 drop into the left eye on the day of eye injections every 10 to 12 weeks     triamcinolone  cream (KENALOG ) 0.1 % Apply 1 Application topically 2 (two) times daily as needed (rash).     No facility-administered medications prior to visit.    ROS: Review of Systems  Constitutional:  Negative for activity change, appetite change, chills, fatigue and unexpected weight change.  HENT:  Negative for congestion, mouth sores and sinus pressure.   Eyes:  Negative for visual disturbance.  Respiratory:  Negative for cough and chest tightness.   Gastrointestinal:  Negative for abdominal pain and nausea.  Genitourinary:  Negative for difficulty urinating, frequency and  vaginal pain.  Musculoskeletal:  Negative for arthralgias, back pain and gait problem.  Skin:  Negative for pallor and rash.  Neurological:  Negative for dizziness, tremors, weakness, numbness and headaches.  Psychiatric/Behavioral:  Negative for confusion and sleep disturbance.     Objective:  BP 118/80   Pulse (!) 59   Temp 98.2 F (36.8 C) (Oral)   Ht 5\' 3"  (1.6 m)   Wt 112 lb (50.8 kg)   SpO2 94%   BMI 19.84 kg/m   BP Readings from Last 3 Encounters:  05/14/24 118/80  04/05/24 (!) 141/54  03/05/24 (!) 150/90    Wt Readings from Last 3 Encounters:  05/14/24 112 lb (50.8 kg)  04/05/24 113 lb (51.3 kg)  03/05/24 118 lb 9.6 oz (53.8 kg)    Physical Exam Constitutional:      General: She is not in acute distress.    Appearance: Normal appearance. She is well-developed.  HENT:     Head: Normocephalic.     Right Ear: External ear normal.     Left Ear: External ear normal.     Nose: Nose normal.  Eyes:     General:        Right eye: No discharge.        Left eye: No discharge.     Conjunctiva/sclera: Conjunctivae normal.     Pupils: Pupils are equal,  round, and reactive to light.  Neck:     Thyroid : No thyromegaly.     Vascular: No JVD.     Trachea: No tracheal deviation.  Cardiovascular:     Rate and Rhythm: Normal rate and regular rhythm.     Heart sounds: Normal heart sounds.  Pulmonary:     Effort: No respiratory distress.     Breath sounds: No stridor. No wheezing.  Abdominal:     General: Bowel sounds are normal. There is no distension.     Palpations: Abdomen is soft. There is no mass.     Tenderness: There is no abdominal tenderness. There is no guarding or rebound.  Musculoskeletal:        General: No tenderness.     Cervical back: Normal range of motion and neck supple. No rigidity.  Lymphadenopathy:     Cervical: No cervical adenopathy.  Skin:    Findings: No erythema or rash.  Neurological:     Cranial Nerves: No cranial nerve deficit.      Motor: No abnormal muscle tone.     Coordination: Coordination normal.     Deep Tendon Reflexes: Reflexes normal.  Psychiatric:        Behavior: Behavior normal.        Thought Content: Thought content normal.        Judgment: Judgment normal.    Pacemaker site is healed  Lab Results  Component Value Date   WBC 5.5 03/22/2024   HGB 13.0 03/22/2024   HCT 38.3 03/22/2024   PLT 293 03/22/2024   GLUCOSE 79 03/22/2024   CHOL 257 (H) 12/29/2023   TRIG 110.0 12/29/2023   HDL 76.20 12/29/2023   LDLCALC 159 (H) 12/29/2023   ALT 13 12/29/2023   AST 19 12/29/2023   NA 136 03/22/2024   K 4.6 03/22/2024   CL 98 03/22/2024   CREATININE 0.59 03/22/2024   BUN 10 03/22/2024   CO2 24 03/22/2024   TSH 2.55 12/29/2023   INR 1.41 08/07/2015    DG Chest 2 View Result Date: 04/05/2024 CLINICAL DATA:  Pacemaker placement EXAM: CHEST - 2 VIEW COMPARISON:  01/28/2020 FINDINGS: Frontal and lateral views of the chest demonstrate dual lead pacer within the left anterior chest wall, proximal lead in the region of the right atrium and distal lead in the region of the right ventricle. Cardiac silhouette is unremarkable. No acute airspace disease, effusion, or pneumothorax. No acute bony abnormalities. IMPRESSION: 1. No evidence of complication after pacemaker placement. 2. No acute intrathoracic process. Electronically Signed   By: Bobbye Burrow M.D.   On: 04/05/2024 19:28   EP PPM/ICD IMPLANT Result Date: 04/05/2024 SURGEON:  Agatha Horsfall, MD   PREPROCEDURE DIAGNOSIS:  second degree AV block   POSTPROCEDURE DIAGNOSIS:  second degree AV block    PROCEDURES:  1. Pacemaker implantation.  2. Left upper extremity venography.   INTRODUCTION:  Vicki [name] is a 78 y.o. female with a history of bradycardia who presents today for pacemaker implantation.  The patient reports intermittent episodes of dizziness over the past few months.  No reversible causes have been identified.  The patient therefore presents today  for pacemaker implantation.   DESCRIPTION OF PROCEDURE:  Informed written consent was obtained, and  the patient was brought to the electrophysiology lab in a fasting state.  The patient required no sedation for the procedure today.  The patients left chest was prepped and draped in the usual sterile fashion by the EP lab staff.  The skin overlying the left deltopectoral region was infiltrated with lidocaine  for local analgesia.  A 4-cm incision was made over the left deltopectoral region.  A left subcutaneous pacemaker pocket was fashioned using a combination of sharp and blunt dissection. Electrocautery was required to assure hemostasis.  Left Upper Extremity Venography: A venogram of the left upper extremity was performed, which revealed a large left cephalic vein, which emptied into a large left subclavian vein.  The left axillary vein was moderate in size.  RA/RV Lead Placement: The left axillary vein was therefore cannulated.  Through the left axillary vein, a Abbott Ultipace 1231-52  (serial number  [id number]) right atrial lead and an Abbott Ultipace 1231-65 (serial number  [id number]) right ventricular lead were advanced with fluoroscopic visualization into the right atrial appendage and left bundle area positions respectively.  Initial atrial lead P- waves measured 2.8 mV with impedance of 430 ohms and a threshold of 1.5 V at 0.5 msec.  Right ventricular lead R-waves measured 6.3 mV with an impedance of 640 ohms and a threshold of 0.75 V at 0.5 msec.  Both leads were secured to the pectoralis fascia using #2-0 silk over the suture sleeves. Device Placement:  The leads were then connected to an Abbott Assurity [id number]  (serial number  [id number] ) pacemaker.  The pocket was irrigated with copious gentamicin  solution.  The pacemaker was then placed into the pocket.  The pocket was then closed in 3 layers with 2.0 and 3.0 V-Loc suture for the subcutaneous layers and 3.0 Vicryl suture for the subcuticular layers.   Steri-  Strips and a sterile dressing were then applied. EBL<21ml.  There were no early apparent complications.   CONCLUSIONS:  1. Successful implantation of a Abbott Assurity [id number] dual-chamber pacemaker for symptomatic bradycardia  2. No early apparent complications.   Will [name], MD 04/05/2024 3:01 PM   Assessment & Plan:   Problem List Items Addressed This Visit     Dyslipidemia   On Pitavastatin  and ASA      Anxiety state   Stable       CAD (coronary artery disease) - Primary    Simvastatin  - pt stopped, feeling better, no joint pain now On Pitavastatin  and ASA      Heart block AV second degree   2025 S/p pacemaker placement         No orders of the defined types were placed in this encounter.     Follow-up: Return in about 6 months (around 11/13/2024) for a follow-up visit.  Anitra Barn, MD

## 2024-05-18 ENCOUNTER — Ambulatory Visit (INDEPENDENT_AMBULATORY_CARE_PROVIDER_SITE_OTHER)

## 2024-05-18 DIAGNOSIS — I441 Atrioventricular block, second degree: Secondary | ICD-10-CM | POA: Diagnosis not present

## 2024-05-21 LAB — CUP PACEART REMOTE DEVICE CHECK
Battery Remaining Longevity: 89 mo
Battery Remaining Percentage: 95.5 %
Battery Voltage: 3.02 V
Brady Statistic AP VP Percent: 1 %
Brady Statistic AP VS Percent: 63 %
Brady Statistic AS VP Percent: 1 %
Brady Statistic AS VS Percent: 37 %
Brady Statistic RA Percent Paced: 63 %
Brady Statistic RV Percent Paced: 1 %
Date Time Interrogation Session: 20250613040016
Implantable Lead Connection Status: 753985
Implantable Lead Connection Status: 753985
Implantable Lead Implant Date: 20250501
Implantable Lead Implant Date: 20250501
Implantable Lead Location: 753859
Implantable Lead Location: 753860
Implantable Pulse Generator Implant Date: 20250501
Lead Channel Impedance Value: 450 Ohm
Lead Channel Impedance Value: 460 Ohm
Lead Channel Pacing Threshold Amplitude: 0.75 V
Lead Channel Pacing Threshold Amplitude: 0.875 V
Lead Channel Pacing Threshold Pulse Width: 0.5 ms
Lead Channel Pacing Threshold Pulse Width: 0.5 ms
Lead Channel Sensing Intrinsic Amplitude: 10.6 mV
Lead Channel Sensing Intrinsic Amplitude: 4.2 mV
Lead Channel Setting Pacing Amplitude: 1.125
Lead Channel Setting Pacing Amplitude: 3.5 V
Lead Channel Setting Pacing Pulse Width: 0.5 ms
Lead Channel Setting Sensing Sensitivity: 2 mV
Pulse Gen Model: 2272
Pulse Gen Serial Number: 8257987

## 2024-05-22 ENCOUNTER — Ambulatory Visit: Payer: Self-pay | Admitting: Cardiology

## 2024-07-02 NOTE — Progress Notes (Unsigned)
 Cardiology Office Note:  .   Date:  07/02/2024  ID:  Vicki [name], DOB 1945-12-12, MRN [id number] PCP: Garald Karlynn GAILS, MD  [facility] Providers Cardiologist:  None Electrophysiologist:  Will Gladis Norton, MD {  History of Present Illness: .   Vicki [name] is a 78 y.o. female w/PMHx of  HLD AFib Advanced AV block > PPM  She has followed with Dr. Norton for her Afib (s/p ablation) over the years, march found by her PMD to have developed AV block and confirmed by Dr. Norton > on no nodal blocking agents/no reversible causes planned for PPM   Today's visit is scheduled as her 90 day post implant visit ROS:   She comes today with her husband. She is doing OK She has noticed in the  mornings that she tends to feel sluggish, on occasion will even start to feel a bit lightheaded and have to sit down, and it will pass. She denies overt orthostatic/dizziness upon standing Does not seem to have it mid-day/afternoon/evenings She reports generally her BP similar to today  No near syncope or syncope  No CP, SOB  Device information Abbott dual chamber PPM implanted 04/05/24  Arrhythmia/AAD hx Afib ablation 05/07/2016  Studies Reviewed: SABRA    EKG not done today  DEVICE interrogation done today and reviewed by myself Battery and lead measurements are good One AMS 8 seconds, was an AT No HVR episodes AP 65% VP <1 SB 50s underlying today  05/07/2016: EPS/ablation CONCLUSIONS: 1. Sinus rhythm upon presentation.   2.Successful electrical isolation and anatomical encircling of all four pulmonary veins with radiofrequency current. 3. No inducible arrhythmias following ablation both on and off of dobutamine  4. No early apparent complications.   07/09/2015: TTE Study Conclusions  - Left ventricle: The cavity size was normal. Wall thickness was    normal. Systolic function was normal. The estimated ejection    fraction was in the range of 55% to 60%. Wall  motion was normal;    there were no regional wall motion abnormalities. Doppler    parameters are consistent with high ventricular filling pressure.  - Aortic valve: There was mild regurgitation.  - Mitral valve: Calcified annulus. Mildly thickened leaflets . Mild    prolapse, involving the anterior leaflet. There was mild    regurgitation.  - Pulmonary arteries: Systolic pressure was mildly increased.  - Pericardium, extracardiac: A trivial pericardial effusion was    identified.    Risk Assessment/Calculations:    Physical Exam:   VS:  There were no vitals taken for this visit.   Wt Readings from Last 3 Encounters:  05/14/24 112 lb (50.8 kg)  04/05/24 113 lb (51.3 kg)  03/05/24 118 lb 9.6 oz (53.8 kg)    GEN: Well nourished, well developed in no acute distress NECK: No JVD; No carotid bruits CARDIAC: RRR, no murmurs, rubs, gallops RESPIRATORY:  CTA b/l without rales, wheezing or rhonchi  ABDOMEN: Soft, non-tender, non-distended EXTREMITIES: No edema; No deformity   PPM site: is well healed, stable, no thinning, fluctuation, tethering  ASSESSMENT AND PLAN: .    persistent AFib Ablated 2017 > with no recurrent arrhythmia her OAC has been stopped zero % burden  PPM intact function Chronic output programmed A RV lead is on auto  3. Symptom as described Suspect BP Pacer function is intact, no findings to explain this Seated BP 148/70 Standing 140/60 130/64  Advised adequate hydration Daily BP log and BP with symptoms if able  Advised she discuss her BP with her PMD Though with symptoms she can reach out to us  as well, BP readings/check rhythm/remote etc   Dispo: remotes as usual, back in clinic in 46mo, sooner if needed  Signed, Charlies Macario Arthur, PA-C

## 2024-07-05 ENCOUNTER — Ambulatory Visit: Attending: Physician Assistant | Admitting: Physician Assistant

## 2024-07-05 ENCOUNTER — Encounter: Payer: Self-pay | Admitting: Physician Assistant

## 2024-07-05 VITALS — BP 148/70 | HR 60 | Ht 63.0 in | Wt 120.0 lb

## 2024-07-05 DIAGNOSIS — R42 Dizziness and giddiness: Secondary | ICD-10-CM | POA: Diagnosis not present

## 2024-07-05 DIAGNOSIS — I4819 Other persistent atrial fibrillation: Secondary | ICD-10-CM

## 2024-07-05 DIAGNOSIS — Z950 Presence of cardiac pacemaker: Secondary | ICD-10-CM | POA: Diagnosis not present

## 2024-07-05 LAB — CUP PACEART INCLINIC DEVICE CHECK
Battery Remaining Longevity: 123 mo
Battery Voltage: 3.01 V
Brady Statistic RA Percent Paced: 65 %
Brady Statistic RV Percent Paced: 0.26 %
Date Time Interrogation Session: 20250731111454
Implantable Lead Connection Status: 753985
Implantable Lead Connection Status: 753985
Implantable Lead Implant Date: 20250501
Implantable Lead Implant Date: 20250501
Implantable Lead Location: 753859
Implantable Lead Location: 753860
Implantable Pulse Generator Implant Date: 20250501
Lead Channel Impedance Value: 437.5 Ohm
Lead Channel Impedance Value: 487.5 Ohm
Lead Channel Pacing Threshold Amplitude: 0.75 V
Lead Channel Pacing Threshold Amplitude: 0.75 V
Lead Channel Pacing Threshold Amplitude: 1 V
Lead Channel Pacing Threshold Amplitude: 1 V
Lead Channel Pacing Threshold Pulse Width: 0.5 ms
Lead Channel Pacing Threshold Pulse Width: 0.5 ms
Lead Channel Pacing Threshold Pulse Width: 0.5 ms
Lead Channel Pacing Threshold Pulse Width: 0.5 ms
Lead Channel Sensing Intrinsic Amplitude: 10.4 mV
Lead Channel Sensing Intrinsic Amplitude: 4.6 mV
Lead Channel Setting Pacing Amplitude: 1.125
Lead Channel Setting Pacing Amplitude: 2.5 V
Lead Channel Setting Pacing Pulse Width: 0.5 ms
Lead Channel Setting Sensing Sensitivity: 2 mV
Pulse Gen Model: 2272
Pulse Gen Serial Number: 8257987

## 2024-07-05 NOTE — Progress Notes (Signed)
 Remote pacemaker transmission.

## 2024-07-05 NOTE — Patient Instructions (Signed)
 Medication Instructions:   Your physician recommends that you continue on your current medications as directed. Please refer to the Current Medication list given to you today.   *If you need a refill on your cardiac medications before your next appointment, please call your pharmacy*   Lab Work: NONE ORDERED  TODAY     If you have labs (blood work) drawn today and your tests are completely normal, you will receive your results only by: MyChart Message (if you have MyChart) OR A paper copy in the mail If you have any lab test that is abnormal or we need to change your treatment, we will call you to review the results.    Testing/Procedures: NONE ORDERED  TODAY     Follow-Up: At [facility], you and your health needs are our priority.  As part of our continuing mission to provide you with exceptional heart care, our providers are all part of one team.  This team includes your primary Cardiologist (physician) and Advanced Practice Providers or APPs (Physician Assistants and Nurse Practitioners) who all work together to provide you with the care you need, when you need it.  Your next appointment:     6 month(s)   Provider:    You may see Will Gladis Norton, MD or one of the following Advanced Practice Providers on your designated Care Team:   Charlies Arthur, PA-C Michael Andy Tillery, PA-C   We recommend signing up for the patient portal called MyChart.  Sign up information is provided on this After Visit Summary.  MyChart is used to connect with patients for Virtual Visits (Telemedicine).  Patients are able to view lab/test results, encounter notes, upcoming appointments, etc.  Non-urgent messages can be sent to your provider as well.   To learn more about what you can do with MyChart, go to [email or web address].   Other Instructions

## 2024-07-05 NOTE — Addendum Note (Signed)
 Addended by: VICCI SELLER A on: 07/05/2024 11:59 AM   Modules accepted: Orders

## 2024-07-06 ENCOUNTER — Ambulatory Visit: Payer: Self-pay | Admitting: Cardiology

## 2024-07-09 DIAGNOSIS — L209 Atopic dermatitis, unspecified: Secondary | ICD-10-CM | POA: Diagnosis not present

## 2024-07-09 DIAGNOSIS — L820 Inflamed seborrheic keratosis: Secondary | ICD-10-CM | POA: Diagnosis not present

## 2024-07-24 ENCOUNTER — Encounter (INDEPENDENT_AMBULATORY_CARE_PROVIDER_SITE_OTHER): Admitting: Ophthalmology

## 2024-07-24 DIAGNOSIS — I10 Essential (primary) hypertension: Secondary | ICD-10-CM | POA: Diagnosis not present

## 2024-07-24 DIAGNOSIS — H43813 Vitreous degeneration, bilateral: Secondary | ICD-10-CM | POA: Diagnosis not present

## 2024-07-24 DIAGNOSIS — H35033 Hypertensive retinopathy, bilateral: Secondary | ICD-10-CM | POA: Diagnosis not present

## 2024-07-24 DIAGNOSIS — H353112 Nonexudative age-related macular degeneration, right eye, intermediate dry stage: Secondary | ICD-10-CM

## 2024-07-24 DIAGNOSIS — H353221 Exudative age-related macular degeneration, left eye, with active choroidal neovascularization: Secondary | ICD-10-CM

## 2024-08-12 ENCOUNTER — Other Ambulatory Visit: Payer: Self-pay | Admitting: Internal Medicine

## 2024-08-17 ENCOUNTER — Ambulatory Visit (INDEPENDENT_AMBULATORY_CARE_PROVIDER_SITE_OTHER)

## 2024-08-17 DIAGNOSIS — I4819 Other persistent atrial fibrillation: Secondary | ICD-10-CM

## 2024-08-17 LAB — CUP PACEART REMOTE DEVICE CHECK
Battery Remaining Longevity: 114 mo
Battery Remaining Percentage: 95.5 %
Battery Voltage: 3.01 V
Brady Statistic AP VP Percent: 1 %
Brady Statistic AP VS Percent: 64 %
Brady Statistic AS VP Percent: 1 %
Brady Statistic AS VS Percent: 36 %
Brady Statistic RA Percent Paced: 64 %
Brady Statistic RV Percent Paced: 1 %
Date Time Interrogation Session: 20250912055925
Implantable Lead Connection Status: 753985
Implantable Lead Connection Status: 753985
Implantable Lead Implant Date: 20250501
Implantable Lead Implant Date: 20250501
Implantable Lead Location: 753859
Implantable Lead Location: 753860
Implantable Pulse Generator Implant Date: 20250501
Lead Channel Impedance Value: 440 Ohm
Lead Channel Impedance Value: 460 Ohm
Lead Channel Pacing Threshold Amplitude: 0.75 V
Lead Channel Pacing Threshold Amplitude: 1 V
Lead Channel Pacing Threshold Pulse Width: 0.5 ms
Lead Channel Pacing Threshold Pulse Width: 0.5 ms
Lead Channel Sensing Intrinsic Amplitude: 11.5 mV
Lead Channel Sensing Intrinsic Amplitude: 4.8 mV
Lead Channel Setting Pacing Amplitude: 1.25 V
Lead Channel Setting Pacing Amplitude: 2.5 V
Lead Channel Setting Pacing Pulse Width: 0.5 ms
Lead Channel Setting Sensing Sensitivity: 2 mV
Pulse Gen Model: 2272
Pulse Gen Serial Number: 8257987

## 2024-08-21 ENCOUNTER — Ambulatory Visit: Payer: Self-pay | Admitting: Cardiology

## 2024-08-24 NOTE — Progress Notes (Signed)
 Remote PPM Transmission

## 2024-09-05 DIAGNOSIS — H43393 Other vitreous opacities, bilateral: Secondary | ICD-10-CM | POA: Diagnosis not present

## 2024-09-05 DIAGNOSIS — H40033 Anatomical narrow angle, bilateral: Secondary | ICD-10-CM | POA: Diagnosis not present

## 2024-10-16 ENCOUNTER — Encounter (INDEPENDENT_AMBULATORY_CARE_PROVIDER_SITE_OTHER): Admitting: Ophthalmology

## 2024-10-16 DIAGNOSIS — H353112 Nonexudative age-related macular degeneration, right eye, intermediate dry stage: Secondary | ICD-10-CM | POA: Diagnosis not present

## 2024-10-16 DIAGNOSIS — I10 Essential (primary) hypertension: Secondary | ICD-10-CM

## 2024-10-16 DIAGNOSIS — H43813 Vitreous degeneration, bilateral: Secondary | ICD-10-CM | POA: Diagnosis not present

## 2024-10-16 DIAGNOSIS — H35033 Hypertensive retinopathy, bilateral: Secondary | ICD-10-CM | POA: Diagnosis not present

## 2024-10-16 DIAGNOSIS — H353221 Exudative age-related macular degeneration, left eye, with active choroidal neovascularization: Secondary | ICD-10-CM

## 2024-10-26 ENCOUNTER — Ambulatory Visit (INDEPENDENT_AMBULATORY_CARE_PROVIDER_SITE_OTHER): Payer: Medicare HMO

## 2024-10-26 VITALS — Ht 63.0 in | Wt 113.6 lb

## 2024-10-26 DIAGNOSIS — Z0000 Encounter for general adult medical examination without abnormal findings: Secondary | ICD-10-CM | POA: Diagnosis not present

## 2024-10-26 NOTE — Patient Instructions (Signed)
 Vicki [name],  Thank you for taking the time for your Medicare Wellness Visit. I appreciate your continued commitment to your health goals. Please review the care plan we discussed, and feel free to reach out if I can assist you further.  Please note that Annual Wellness Visits do not include a physical exam. Some assessments may be limited, especially if the visit was conducted virtually. If needed, we may recommend an in-person follow-up with your provider.  Ongoing Care Seeing your primary care provider every 3 to 6 months helps us  monitor your health and provide consistent, personalized care. Next office visit on 11/13/2024.  Aim for 30 minutes of exercise or brisk walking, 6-8 glasses of water, and 5 servings of fruits and vegetables each day.   Referrals If a referral was made during today's visit and you haven't received any updates within two weeks, please contact the referred provider directly to check on the status.  Recommended Screenings:  Health Maintenance  Topic Date Due   Medicare Annual Wellness Visit  10/20/2024   DTaP/Tdap/Td vaccine (2 - Tdap) 07/03/2025   Pneumococcal Vaccine for age over 44  Completed   Flu Shot  Completed   Osteoporosis screening with Bone Density Scan  Completed   Hepatitis C Screening  Completed   Zoster (Shingles) Vaccine  Completed   Meningitis B Vaccine  Aged Out   Breast Cancer Screening  Discontinued   Colon Cancer Screening  Discontinued   COVID-19 Vaccine  Discontinued       10/26/2024    8:17 AM  Advanced Directives  Does Patient Have a Medical Advance Directive? No    Vision: Annual vision screenings are recommended for early detection of glaucoma, cataracts, and diabetic retinopathy. These exams can also reveal signs of chronic conditions such as diabetes and high blood pressure.  Dental: Annual dental screenings help detect early signs of oral cancer, gum disease, and other conditions linked to overall health, including heart  disease and diabetes.  Please see the attached documents for additional preventive care recommendations.

## 2024-10-26 NOTE — Progress Notes (Signed)
 Chief Complaint  Patient presents with   Medicare Wellness     Subjective:   Vicki [name] is a 78 y.o. female who presents for a Medicare Annual Wellness Visit.  Allergies (verified) Codeine and Simvastatin    History: Past Medical History:  Diagnosis Date   Allergic rhinitis    Anxiety    Aortic insufficiency    Breast cancer (HCC) 2008   Left Breast Cancer   Contact dermatitis    Diverticulosis of colon    DJD (degenerative joint disease)    Dysrhythmia    A-fib   GERD (gastroesophageal reflux disease)    Hx of colonic polyps    Hyperlipidemia    Macular degeneration, bilateral    Osteopenia    Paroxysmal atrial fibrillation (HCC)    A. fib is now persistent.   Personal history of radiation therapy 2008   Left Breast Cancer   Visit for monitoring Tikosyn  therapy 09/06/2015   Past Surgical History:  Procedure Laterality Date   ABDOMINAL HYSTERECTOMY  1984   with 1 ovary removal   BREAST LUMPECTOMY Left 08/2007    with node bx   ELECTROPHYSIOLOGIC STUDY N/A 05/07/2016   Procedure: Atrial Fibrillation Ablation;  Surgeon: Will Gladis Norton, MD;  Location: [facility];  Service: Cardiovascular;  Laterality: N/A;   PACEMAKER IMPLANT N/A 04/05/2024   Procedure: PACEMAKER IMPLANT;  Surgeon: Norton Soyla Gladis, MD;  Location: [facility];  Service: Cardiovascular;  Laterality: N/A;   Family History  Problem Relation Age of Onset   Heart disease Father 37   Heart attack Father    Other Mother 21       AVR   Heart disease Mother    Clotting disorder Mother 66   Breast cancer Sister    Lymphoma Sister    Hypertension Sister    Healthy Brother    Other Maternal Grandmother    Colon cancer Neg Hx    Esophageal cancer Neg Hx    Pancreatic cancer Neg Hx    Social History   Occupational History   Occupation: ACCOUNTING    Employer: [employer]  Tobacco Use   Smoking status: Former    Current packs/day: 0.00    Types: Cigarettes    Quit  date: 12/06/1976    Years since quitting: 47.9   Smokeless tobacco: Never   Tobacco comments:    smoked approx 10 years  Vaping Use   Vaping status: Never Used  Substance and Sexual Activity   Alcohol use: No    Alcohol/week: 0.0 standard drinks of alcohol   Drug use: No   Sexual activity: Not Currently   Tobacco Counseling Counseling given: Not Answered Tobacco comments: smoked approx 10 years  SDOH Screenings   Food Insecurity: No Food Insecurity (10/26/2024)  Housing: Unknown (10/26/2024)  Transportation Needs: No Transportation Needs (10/26/2024)  Utilities: Not At Risk (10/26/2024)  Alcohol Screen: Low Risk  (10/21/2023)  Depression (PHQ2-9): Low Risk  (10/26/2024)  Financial Resource Strain: Low Risk  (10/21/2023)  Physical Activity: Inactive (10/21/2023)  Social Connections: Moderately Integrated (10/26/2024)  Stress: No Stress Concern Present (10/26/2024)  Tobacco Use: Medium Risk (10/26/2024)  Health Literacy: Adequate Health Literacy (10/26/2024)   See flowsheets for full screening details  Depression Screen PHQ 2 & 9 Depression Scale- Over the past 2 weeks, how often have you been bothered by any of the following problems? Little interest or pleasure in doing things: 0 Feeling down, depressed, or hopeless (PHQ Adolescent also  includes...irritable): 0 PHQ-2 Total Score: 0 Trouble falling or staying asleep, or sleeping too much: 0 Feeling tired or having little energy: 0 Poor appetite or overeating (PHQ Adolescent also includes...weight loss): 0 Feeling bad about yourself - or that you are a failure or have let yourself or your family down: 0 Trouble concentrating on things, such as reading the newspaper or watching television (PHQ Adolescent also includes...like school work): 0 Moving or speaking so slowly that other people could have noticed. Or the opposite - being so fidgety or restless that you have been moving around a lot more than usual: 0 Thoughts that you  would be better off dead, or of hurting yourself in some way: 0 PHQ-9 Total Score: 0 If you checked off any problems, how difficult have these problems made it for you to do your work, take care of things at home, or get along with other people?: Not difficult at all  Depression Treatment Depression Interventions/Treatment : EYV7-0 Score <4 Follow-up Not Indicated     Goals Addressed             This Visit's Progress    Patient Stated   On track    10/21/2023 stay healthy       Visit info / Clinical Intake: Medicare Wellness Visit Type:: Subsequent Annual Wellness Visit Persons participating in visit:: patient Medicare Wellness Visit Mode:: Video Because this visit was a virtual/telehealth visit:: pt reported vitals If Telephone or Video please confirm:: I connected with the patient using audio enabled telemedicine application and verified that I am speaking with the correct person using two identifiers; I discussed the limitations of evaluation and management by telemedicine; The patient expressed understanding and agreed to proceed Patient Location:: home Provider Location:: Home Information given by:: patient Interpreter Needed?: No AWV questionnaire completed by patient prior to visit?: no Living arrangements:: lives with spouse/significant other Patient's Overall Health Status Rating: very good Typical amount of pain: none Does pain affect daily life?: no Are you currently prescribed opioids?: no  Dietary Habits and Nutritional Risks How many meals a day?: 3 Eats fruit and vegetables daily?: (!) no Most meals are obtained by: eating out; preparing own meals Diabetic:: no  Functional Status Activities of Daily Living (to include ambulation/medication): Independent Ambulation: Independent with device- listed below Home Assistive Devices/Equipment: Eyeglasses Medication Administration: Independent Home Management: Independent Manage your own finances?: yes Primary  transportation is: driving Concerns about vision?: no *vision screening is required for WTM* Concerns about hearing?: no  Fall Screening Falls in the past year?: 0 Number of falls in past year: 0 Was there an injury with Fall?: 0 Fall Risk Category Calculator: 0 Patient Fall Risk Level: Low Fall Risk  Fall Risk Patient at Risk for Falls Due to: No Fall Risks Fall risk Follow up: Falls evaluation completed; Falls prevention discussed  Home and Transportation Safety: All rugs have non-skid backing?: yes All stairs or steps have railings?: N/A, no stairs Grab bars in the bathtub or shower?: (!) no Have non-skid surface in bathtub or shower?: yes Good home lighting?: yes Regular seat belt use?: yes Hospital stays in the last year:: no  Cognitive Assessment Difficulty concentrating, remembering, or making decisions? : no Will 6CIT or Mini Cog be Completed: no 6CIT or Mini Cog Declined: patient alert, oriented, able to answer questions appropriately and recall recent events  Advance Directives (For Healthcare) Does Patient Have a Medical Advance Directive?: No Would patient like information on creating a medical advance directive?: No -  Patient declined  Reviewed/Updated  Reviewed/Updated: Reviewed All (Medical, Surgical, Family, Medications, Allergies, Care Teams, Patient Goals)        Objective:    Today's Vitals   10/26/24 0806  Weight: 113 lb 9.6 oz (51.5 kg)  Height: 5' 3 (1.6 m)   Body mass index is 20.12 kg/m.  Current Medications (verified) Outpatient Encounter Medications as of 10/26/2024  Medication Sig   acetaminophen  (TYLENOL ) 650 MG CR tablet Take 1,300 mg by mouth every 8 (eight) hours as needed for pain.   aspirin  EC 81 MG tablet Take 1 tablet (81 mg total) by mouth daily. Swallow whole.   cholecalciferol  (VITAMIN D ) 1000 UNITS tablet Take 1,000 Units by mouth 2 (two) times daily.   loratadine  (CLARITIN ) 10 MG tablet Take 10 mg by mouth daily.    Melatonin 3 MG TABS Take 3 mg by mouth at bedtime.   Multiple Vitamins-Minerals (PRESERVISION AREDS 2) CAPS Take 1 capsule by mouth 2 (two) times daily.   Omega-3 Fatty Acids (FISH OIL  ULTRA) 1400 MG CAPS Take 1,400 mg by mouth 2 (two) times daily.   Pitavastatin  Calcium  1 MG TABS Take 1 tablet by mouth once daily   prednisoLONE acetate (PRED FORTE) 1 % ophthalmic suspension Place 3 drops into the left eye See admin instructions. Instill 3 drop into the left eye on the day of eye injections every 10 to 12 weeks   triamcinolone  cream (KENALOG ) 0.1 % Apply 1 Application topically 2 (two) times daily as needed (rash).   No facility-administered encounter medications on file as of 10/26/2024.   Hearing/Vision screen Hearing Screening - Comments:: Denies hearing difficulties   Vision Screening - Comments:: Weras eyeglasses /Dr. Matthews/UTD Immunizations and Health Maintenance Health Maintenance  Topic Date Due   Medicare Annual Wellness (AWV)  10/20/2024   DTaP/Tdap/Td (2 - Tdap) 07/03/2025   Pneumococcal Vaccine: 50+ Years  Completed   Influenza Vaccine  Completed   Bone Density Scan  Completed   Hepatitis C Screening  Completed   Zoster Vaccines- Shingrix   Completed   Meningococcal B Vaccine  Aged Out   Mammogram  Discontinued   Colonoscopy  Discontinued   COVID-19 Vaccine  Discontinued        Assessment/Plan:  This is a routine wellness examination for Vicki [name].  Patient Care Team: Plotnikov, Karlynn GAILS, MD as PCP - General (Internal Medicine) Inocencio Soyla Lunger, MD as PCP - Electrophysiology (Cardiology) Court Dorn PARAS, MD as Consulting Physician (Cardiology) Cleatus Collar, MD as Consulting Physician (Ophthalmology) Alvia Norleen BIRCH, MD as Consulting Physician (Ophthalmology) Inocencio Soyla Lunger, MD as Consulting Physician (Cardiology)  I have personally reviewed and noted the following in the patient's chart:   Medical and social history Use of alcohol, tobacco or  illicit drugs  Current medications and supplements including opioid prescriptions. Functional ability and status Nutritional status Physical activity Advanced directives List of other physicians Hospitalizations, surgeries, and ER visits in previous 12 months Vitals Screenings to include cognitive, depression, and falls Referrals and appointments  No orders of the defined types were placed in this encounter.  In addition, I have reviewed and discussed with patient certain preventive protocols, quality metrics, and best practice recommendations. A written personalized care plan for preventive services as well as general preventive health recommendations were provided to patient.   Teran Knittle L Kamran Coker, CMA   10/26/2024   No follow-ups on file.  After Visit Summary: (MyChart) Due to this being a telephonic visit, the after visit summary with patients personalized plan  was offered to patient via MyChart   Nurse Notes: Patient is up to date on all health maintenance with no concerns to address today.

## 2024-11-13 ENCOUNTER — Ambulatory Visit: Admitting: Internal Medicine

## 2024-11-16 ENCOUNTER — Ambulatory Visit

## 2024-11-16 DIAGNOSIS — I4819 Other persistent atrial fibrillation: Secondary | ICD-10-CM

## 2024-11-19 ENCOUNTER — Ambulatory Visit: Payer: Self-pay | Admitting: Cardiology

## 2024-11-19 LAB — CUP PACEART REMOTE DEVICE CHECK
Battery Remaining Longevity: 112 mo
Battery Remaining Percentage: 95.5 %
Battery Voltage: 3.01 V
Brady Statistic AP VP Percent: 1 %
Brady Statistic AP VS Percent: 60 %
Brady Statistic AS VP Percent: 1 %
Brady Statistic AS VS Percent: 40 %
Brady Statistic RA Percent Paced: 59 %
Brady Statistic RV Percent Paced: 1 %
Date Time Interrogation Session: 20251213014119
Implantable Lead Connection Status: 753985
Implantable Lead Connection Status: 753985
Implantable Lead Implant Date: 20250501
Implantable Lead Implant Date: 20250501
Implantable Lead Location: 753859
Implantable Lead Location: 753860
Implantable Pulse Generator Implant Date: 20250501
Lead Channel Impedance Value: 440 Ohm
Lead Channel Impedance Value: 440 Ohm
Lead Channel Pacing Threshold Amplitude: 0.75 V
Lead Channel Pacing Threshold Amplitude: 1 V
Lead Channel Pacing Threshold Pulse Width: 0.5 ms
Lead Channel Pacing Threshold Pulse Width: 0.5 ms
Lead Channel Sensing Intrinsic Amplitude: 11.1 mV
Lead Channel Sensing Intrinsic Amplitude: 4.6 mV
Lead Channel Setting Pacing Amplitude: 1.25 V
Lead Channel Setting Pacing Amplitude: 2.5 V
Lead Channel Setting Pacing Pulse Width: 0.5 ms
Lead Channel Setting Sensing Sensitivity: 2 mV
Pulse Gen Model: 2272
Pulse Gen Serial Number: 8257987

## 2024-11-22 NOTE — Progress Notes (Signed)
 Remote PPM Transmission

## 2024-12-07 ENCOUNTER — Ambulatory Visit
Admission: EM | Admit: 2024-12-07 | Discharge: 2024-12-07 | Disposition: A | Discharge status: Home / Self Care | Payer: Medicare (Managed Care) | Attending: Family Medicine | Admitting: Family Medicine

## 2024-12-07 ENCOUNTER — Ambulatory Visit: Payer: Self-pay

## 2024-12-07 ENCOUNTER — Encounter: Payer: Self-pay | Admitting: *Deleted

## 2024-12-07 DIAGNOSIS — J40 Bronchitis, not specified as acute or chronic: Secondary | ICD-10-CM

## 2024-12-07 MED ORDER — BENZONATATE 100 MG PO CAPS: 100.0000 mg | ORAL_CAPSULE | Freq: Three times a day (TID) | ORAL | 0 refills | Status: DC | PRN

## 2024-12-07 MED ORDER — PREDNISONE 20 MG PO TABS: 40.0000 mg | ORAL_TABLET | Freq: Every day | ORAL | 0 refills | Status: AC

## 2024-12-07 NOTE — ED Provider Notes (Signed)
 " [name]    CSN: [id number] Arrival date & time: 12/07/24  0933      History   Chief Complaint Chief Complaint  Patient presents with   Cough    HPI Vicki [name] is a 79 y.o. female.    Cough  Here for cough.  On about December 20 she became ill with fever and upper respiratory symptoms.  She was diagnosed with the flu.  She was prescribed Tamiflu but did not get to take it as her husband forgot to pick it up when he went to [place].  Most the symptoms have improved but she is left with a cough that is very persistent and keeps her up at night.  When she talks it we will get it to go and also.  Her chest does not feel tight and she has not had any wheezing that she has noted.  No history of asthma  She has trouble taking codeine and statins.  Also she had been taking Coricidin and that made her hyper.  She admits to having hyperactivity with various antihistamines, including what ever is and NyQuil.   Past Medical History:  Diagnosis Date   Allergic rhinitis    Anxiety    Aortic insufficiency    Breast cancer (HCC) 2008   Left Breast Cancer   Contact dermatitis    Diverticulosis of colon    DJD (degenerative joint disease)    Dysrhythmia    A-fib   GERD (gastroesophageal reflux disease)    Hx of colonic polyps    Hyperlipidemia    Macular degeneration, bilateral    Osteopenia    Paroxysmal atrial fibrillation (HCC)    A. fib is now persistent.   Personal history of radiation therapy 2008   Left Breast Cancer   Visit for monitoring Tikosyn  therapy 09/06/2015    Patient Active Problem List   Diagnosis Date Noted   Heart block AV second degree 02/09/2024   Statin intolerance 02/09/2024   Rash and nonspecific skin eruption 02/09/2024   Myalgia due to statin 12/29/2023   TIA (transient ischemic attack) 12/29/2023   Statin myopathy 06/02/2022   CAD (coronary artery disease) 06/02/2022   Leg weakness, bilateral 05/10/2022   Low back  pain 05/10/2022   Trochanteric bursitis of both hips 05/10/2022   Pain in left shin 11/04/2020   Elevated BP without diagnosis of hypertension 04/30/2020   Coagulation disorder 03/18/2020   Acute respiratory disease due to COVID-19 virus 01/14/2020   Colon cancer screening 07/11/2019   Menopausal syndrome 02/05/2019   History of breast cancer 02/05/2019   Chest wall pain 10/30/2018   Insomnia 01/09/2018   Ankle sprain 07/08/2017   Weight loss 01/06/2017   Neoplasm of uncertain behavior of skin 04/16/2016   Solitary pulmonary nodule 10/06/2015   Visit for monitoring Tikosyn  therapy 09/06/2015   Epistaxis 08/08/2015   Cerumen impaction 07/04/2015   Abdominal bloating 01/03/2015   Cough 03/20/2014   Well adult exam 03/19/2014   Acute URI 02/28/2014   Shingles rash 01/22/2013   Routine health maintenance 01/03/2012   PAROXYSMAL ATRIAL FIBRILLATION 02/10/2011   RECTAL BLEEDING 08/07/2009   History of colonic polyps 08/04/2009   AORTIC INSUFFICIENCY, MILD 09/07/2008   DIVERTICULOSIS OF COLON 09/07/2008   DEGENERATIVE JOINT DISEASE 09/07/2008   Dyslipidemia 07/04/2008   Anxiety state 07/04/2008   ALLERGIC RHINITIS 07/04/2008   GERD 07/04/2008   CONTACT DERMATITIS 07/04/2008   Osteopenia 07/04/2008    Past Surgical History:  Procedure Laterality  Date   ABDOMINAL HYSTERECTOMY  1984   with 1 ovary removal   BREAST LUMPECTOMY Left 08/2007    with node bx   ELECTROPHYSIOLOGIC STUDY N/A 05/07/2016   Procedure: Atrial Fibrillation Ablation;  Surgeon: Will Gladis Norton, MD;  Location: [facility];  Service: Cardiovascular;  Laterality: N/A;   PACEMAKER IMPLANT N/A 04/05/2024   Procedure: PACEMAKER IMPLANT;  Surgeon: Norton Soyla Gladis, MD;  Location: [facility];  Service: Cardiovascular;  Laterality: N/A;    OB History   No obstetric history on file.      Home Medications    Prior to Admission medications  Medication Sig Start Date End Date Taking?  Authorizing Provider  acetaminophen  (TYLENOL ) 650 MG CR tablet Take 1,300 mg by mouth every 8 (eight) hours as needed for pain.   Yes [provider]  aspirin  EC 81 MG tablet Take 1 tablet (81 mg total) by mouth daily. Swallow whole. 06/02/22  Yes Plotnikov, Aleksei V, MD  benzonatate  (TESSALON ) 100 MG capsule Take 1 capsule (100 mg total) by mouth 3 (three) times daily as needed for cough. 12/07/24  Yes Vonna Sharlet POUR, MD  cholecalciferol  (VITAMIN D ) 1000 UNITS tablet Take 1,000 Units by mouth 2 (two) times daily.   Yes [provider]  loratadine  (CLARITIN ) 10 MG tablet Take 10 mg by mouth daily.   Yes [provider]  Melatonin 3 MG TABS Take 3 mg by mouth at bedtime.   Yes [provider]  Multiple Vitamins-Minerals (PRESERVISION AREDS 2) CAPS Take 1 capsule by mouth 2 (two) times daily.   Yes [provider]  Omega-3 Fatty Acids (FISH OIL  ULTRA) 1400 MG CAPS Take 1,400 mg by mouth 2 (two) times daily.   Yes [provider]  Pitavastatin  Calcium  1 MG TABS Take 1 tablet by mouth once daily 08/13/24  Yes Plotnikov, Aleksei V, MD  predniSONE  (DELTASONE ) 20 MG tablet Take 2 tablets (40 mg total) by mouth daily with breakfast for 5 days. 12/07/24 12/12/24 Yes Orville Widmann K, MD  prednisoLONE acetate (PRED FORTE) 1 % ophthalmic suspension Place 3 drops into the left eye See admin instructions. Instill 3 drop into the left eye on the day of eye injections every 10 to 12 weeks Patient not taking: Reported on 12/07/2024 12/28/22   [provider]  triamcinolone  cream (KENALOG ) 0.1 % Apply 1 Application topically 2 (two) times daily as needed (rash). Patient not taking: Reported on 12/07/2024 01/24/24   [provider]    Family History Family History  Problem Relation Age of Onset   Heart disease Father 29   Heart attack Father    Other Mother 58       AVR   Heart disease Mother    Clotting disorder Mother 54   Breast cancer Sister     Lymphoma Sister    Hypertension Sister    Healthy Brother    Other Maternal Grandmother    Colon cancer Neg Hx    Esophageal cancer Neg Hx    Pancreatic cancer Neg Hx     Social History Social History[1]   Allergies   Codeine and Simvastatin    Review of Systems Review of Systems  Respiratory:  Positive for cough.      Physical Exam Triage Vital Signs ED Triage Vitals  Encounter Vitals Group     BP 12/07/24 1052 (!) 185/82     Girls Systolic BP Percentile --      Girls Diastolic BP  Percentile --      Boys Systolic BP Percentile --      Boys Diastolic BP Percentile --      Pulse Rate 12/07/24 1052 63     Resp 12/07/24 1052 18     Temp 12/07/24 1052 97.7 F (36.5 C)     Temp Source 12/07/24 1052 Oral     SpO2 12/07/24 1052 95 %     Weight --      Height --      Head Circumference --      Peak Flow --      Pain Score 12/07/24 1049 0     Pain Loc --      Pain Education --      Exclude from Growth Chart --    No data found.  Updated Vital Signs BP (!) 182/79 (BP Location: Right Arm)   Pulse 63   Temp 97.7 F (36.5 C) (Oral)   Resp 18   SpO2 95%   Visual Acuity Right Eye Distance:   Left Eye Distance:   Bilateral Distance:    Right Eye Near:   Left Eye Near:    Bilateral Near:     Physical Exam Vitals reviewed.  Constitutional:      General: She is not in acute distress.    Appearance: She is not ill-appearing, toxic-appearing or diaphoretic.  HENT:     Nose: Nose normal.     Mouth/Throat:     Mouth: Mucous membranes are moist.     Pharynx: No oropharyngeal exudate or posterior oropharyngeal erythema.  Eyes:     Extraocular Movements: Extraocular movements intact.     Conjunctiva/sclera: Conjunctivae normal.     Pupils: Pupils are equal, round, and reactive to light.  Cardiovascular:     Rate and Rhythm: Normal rate and regular rhythm.     Heart sounds: No murmur heard. Pulmonary:     Effort: No respiratory distress.     Breath sounds:  No stridor. No rhonchi or rales.     Comments: Air movement is good.  No wheezing during lung exam except when she coughs there is a wheeze right after it. Musculoskeletal:     Cervical back: Neck supple.  Lymphadenopathy:     Cervical: No cervical adenopathy.  Skin:    Capillary Refill: Capillary refill takes less than 2 seconds.     Coloration: Skin is not jaundiced or pale.  Neurological:     General: No focal deficit present.     Mental Status: She is alert and oriented to person, place, and time.  Psychiatric:        Behavior: Behavior normal.      UC Treatments / Results  Labs (all labs ordered are listed, but only abnormal results are displayed) Labs Reviewed - No data to display  EKG   Radiology No results found.  Procedures Procedures (including critical care time)  Medications Ordered in UC Medications - No data to display  Initial Impression / Assessment and Plan / UC Course  I have reviewed the triage vital signs and the nursing notes.  Pertinent labs & imaging results that were available during my care of the patient were reviewed by me and considered in my medical decision making (see chart for details).     Tessalon  Perles were sent in as a cough suppressant.  I doubt that she would tolerate Promethazine DM as it has antihistamine effects that seem to make her hyperactive.  Also 5-day burst of  prednisone  is sent in for bronchial hyperreactivity since I can hear little wheezing after she coughs.    Final Clinical Impressions(s) / UC Diagnoses   Final diagnoses:  Bronchitis     Discharge Instructions      Take prednisone  20 mg--2 daily for 5 days  Take benzonatate  100 mg, 1 tab every 8 hours as needed for cough.  Running humidifier in your bedroom may help at night.       ED Prescriptions     Medication Sig Dispense Auth. Provider   benzonatate  (TESSALON ) 100 MG capsule Take 1 capsule (100 mg total) by mouth 3 (three) times daily as  needed for cough. 21 capsule Vonna Sharlet POUR, MD   predniSONE  (DELTASONE ) 20 MG tablet Take 2 tablets (40 mg total) by mouth daily with breakfast for 5 days. 10 tablet Vonna Shamon Cothran K, MD      PDMP not reviewed this encounter.    [1]  Social History Tobacco Use   Smoking status: Former    Current packs/day: 0.00    Types: Cigarettes    Quit date: 12/06/1976    Years since quitting: 48.0   Smokeless tobacco: Never   Tobacco comments:    smoked approx 10 years  Vaping Use   Vaping status: Never Used  Substance Use Topics   Alcohol use: No    Alcohol/week: 0.0 standard drinks of alcohol   Drug use: No     Vonna Sharlet POUR, MD 12/07/24 1117  "

## 2024-12-07 NOTE — Discharge Instructions (Addendum)
 Take prednisone  20 mg--2 daily for 5 days  Take benzonatate  100 mg, 1 tab every 8 hours as needed for cough.  Running humidifier in your bedroom may help at night.

## 2024-12-07 NOTE — Telephone Encounter (Signed)
 No PCP appointments available, pt directed to UC. Pt to go to [facility].   FYI Only or Action Required?: FYI only for provider: UC advised.  Patient was last seen in primary care on 05/14/2024 by Plotnikov, Karlynn GAILS, MD.  Called Nurse Triage reporting Influenza.  Symptoms began 11/25/24.  Interventions attempted: Rest, hydration, or home remedies.  Symptoms are: gradually worsening.  Triage Disposition: Call PCP Within 24 Hours  Patient/caregiver understands and will follow disposition?:   Copied from CRM #[id number]. Topic: Clinical - Red Word Triage >> Dec 07, 2024  8:20 AM Montie POUR wrote: Red Word that prompted transfer to Nurse Triage:  She went to Urgent Care on 11/25/24 and had the Flu. She still has a very bad cough that is keeping her up at night; also her stomach is starting to hurt. This is getting worse. Reason for Disposition  [1] HIGH RISK  (e.g., weak immune system, 65 years and older, obesity with BMI 40 or higher, pregnant, chronic lung disease) AND [2] evaluated by doctor (or NP/PA) > 72 hours (3 days) ago AND [3] symptoms not BETTER (improved)  Answer Assessment - Initial Assessment Questions 1. DIAGNOSIS CONFIRMATION: When was the influenza diagnosed? By whom? Did you get a test for it?     12/21, diagnosed at UC  2. INFLUENZA MEDICINES: Were you prescribed any medicines for the influenza?  (e.g., zanamivir [Relenza], oseltamivir [Tamiflu]).      Tamiflu but pt states she did not take it; took pills but unsure what they were.  3. SYMPTOMS: What is your main symptom or concern? (e.g., cough, fever, shortness of breath, muscle aches)     Cough  4. ONSET: When did the symptoms start?      12/21  5. COUGH: Do you have a cough? If Yes, ask: How bad is the cough?       Yes, it's a croupy cough, greenish-yellowish mucus  6. FEVER: Do you have a fever? If Yes, ask: What is your temperature, how was it measured, and when did it start?      Denies  7. BREATHING DIFFICULTY: Are you having any difficulty breathing? (e.g., normal; shortness of breath, wheezing, unable to speak)      Feels kind of tight today, cause I've coughed so much. Pt noted to be comfortably speaking in multiple full sentences. Denies SOB.  9. HIGH RISK FOR COMPLICATIONS: Do you have any chronic medical problems? (e.g., asthma, heart or lung disease, obesity, weak immune system)     CAD  Protocols used: Influenza (Flu) Follow-up Call-[facility]

## 2024-12-07 NOTE — ED Triage Notes (Signed)
 Pt reports she has been sick since 11/24/24- went to UC on 12/21 and dx with flu. States she has lingering cough- has been taking Mucinex  with some relief. Cough keeps her up at night. Denies fever at present

## 2024-12-27 ENCOUNTER — Ambulatory Visit: Payer: Medicare (Managed Care) | Admitting: Internal Medicine

## 2024-12-27 ENCOUNTER — Encounter: Payer: Self-pay | Admitting: Internal Medicine

## 2024-12-27 VITALS — BP 152/94 | HR 64 | Ht 63.0 in | Wt 115.6 lb

## 2024-12-27 DIAGNOSIS — I441 Atrioventricular block, second degree: Secondary | ICD-10-CM | POA: Diagnosis not present

## 2024-12-27 DIAGNOSIS — R0989 Other specified symptoms and signs involving the circulatory and respiratory systems: Secondary | ICD-10-CM | POA: Diagnosis not present

## 2024-12-27 DIAGNOSIS — G4700 Insomnia, unspecified: Secondary | ICD-10-CM

## 2024-12-27 DIAGNOSIS — E785 Hyperlipidemia, unspecified: Secondary | ICD-10-CM | POA: Diagnosis not present

## 2024-12-27 DIAGNOSIS — Z7184 Encounter for health counseling related to travel: Secondary | ICD-10-CM

## 2024-12-27 DIAGNOSIS — M8580 Other specified disorders of bone density and structure, unspecified site: Secondary | ICD-10-CM | POA: Diagnosis not present

## 2024-12-27 DIAGNOSIS — I10 Essential (primary) hypertension: Secondary | ICD-10-CM | POA: Diagnosis not present

## 2024-12-27 LAB — CBC WITH DIFFERENTIAL/PLATELET
Basophils Absolute: 0 K/uL (ref 0.0–0.1)
Basophils Relative: 0.4 % (ref 0.0–3.0)
Eosinophils Absolute: 0 K/uL (ref 0.0–0.7)
Eosinophils Relative: 0.1 % (ref 0.0–5.0)
HCT: 36.1 % (ref 36.0–46.0)
Hemoglobin: 12.4 g/dL (ref 12.0–15.0)
Lymphocytes Relative: 22.4 % (ref 12.0–46.0)
Lymphs Abs: 1.1 K/uL (ref 0.7–4.0)
MCHC: 34.4 g/dL (ref 30.0–36.0)
MCV: 93.6 fl (ref 78.0–100.0)
Monocytes Absolute: 0.7 K/uL (ref 0.1–1.0)
Monocytes Relative: 13.1 % — ABNORMAL HIGH (ref 3.0–12.0)
Neutro Abs: 3.3 K/uL (ref 1.4–7.7)
Neutrophils Relative %: 64 % (ref 43.0–77.0)
Platelets: 232 K/uL (ref 150.0–400.0)
RBC: 3.86 Mil/uL — ABNORMAL LOW (ref 3.87–5.11)
RDW: 13.1 % (ref 11.5–15.5)
WBC: 5.1 K/uL (ref 4.0–10.5)

## 2024-12-27 LAB — COMPREHENSIVE METABOLIC PANEL WITH GFR
ALT: 12 U/L (ref 3–35)
AST: 17 U/L (ref 5–37)
Albumin: 3.9 g/dL (ref 3.5–5.2)
Alkaline Phosphatase: 101 U/L (ref 39–117)
BUN: 12 mg/dL (ref 6–23)
CO2: 30 meq/L (ref 19–32)
Calcium: 9 mg/dL (ref 8.4–10.5)
Chloride: 102 meq/L (ref 96–112)
Creatinine, Ser: 0.5 mg/dL (ref 0.40–1.20)
GFR: 89.92 mL/min
Glucose, Bld: 97 mg/dL (ref 70–99)
Potassium: 4.1 meq/L (ref 3.5–5.1)
Sodium: 137 meq/L (ref 135–145)
Total Bilirubin: 0.6 mg/dL (ref 0.2–1.2)
Total Protein: 6.6 g/dL (ref 6.0–8.3)

## 2024-12-27 LAB — LIPID PANEL
Cholesterol: 165 mg/dL (ref 28–200)
HDL: 71.5 mg/dL
LDL Cholesterol: 76 mg/dL (ref 10–99)
NonHDL: 93.26
Total CHOL/HDL Ratio: 2
Triglycerides: 84 mg/dL (ref 10.0–149.0)
VLDL: 16.8 mg/dL (ref 0.0–40.0)

## 2024-12-27 LAB — TSH: TSH: 2.55 u[IU]/mL (ref 0.35–5.50)

## 2024-12-27 MED ORDER — ZOLPIDEM TARTRATE 5 MG PO TABS: 5.0000 mg | ORAL_TABLET | Freq: Every evening | ORAL | 0 refills | Status: AC | PRN

## 2024-12-27 MED ORDER — TELMISARTAN 20 MG PO TABS: 20.0000 mg | ORAL_TABLET | Freq: Every day | ORAL | 3 refills | Status: AC

## 2024-12-27 MED ORDER — AZITHROMYCIN 250 MG PO TABS: ORAL_TABLET | ORAL | 0 refills | Status: AC

## 2024-12-27 NOTE — Assessment & Plan Note (Signed)
 Start Micardis  RTC 3 mo

## 2024-12-27 NOTE — Assessment & Plan Note (Signed)
  On Pitavastatin

## 2024-12-27 NOTE — Assessment & Plan Note (Signed)
 Mild B Check [place]

## 2024-12-27 NOTE — Assessment & Plan Note (Signed)
 Zolpidem  for travel  Potential benefits of a short term benzodiazepines  use as well as potential risks  and complications were explained to the patient and were aknowledged.

## 2024-12-27 NOTE — Progress Notes (Signed)
 "  Subjective:  Patient ID: Vicki [name], female    DOB: 06-20-1946  Age: 79 y.o. MRN: [id number]  CC: Medical Management of Chronic Issues (6 Month follow up)   HPI Vicki [name] presents for PAF, CAD, TIA Vicki [name] had a flu in Dec 2026 She is going on a river cruise in [place] with [employer]  Outpatient Medications Prior to Visit  Medication Sig Dispense Refill   acetaminophen  (TYLENOL ) 650 MG CR tablet Take 1,300 mg by mouth every 8 (eight) hours as needed for pain.     aspirin  EC 81 MG tablet Take 1 tablet (81 mg total) by mouth daily. Swallow whole. 30 tablet 12   cholecalciferol  (VITAMIN D ) 1000 UNITS tablet Take 1,000 Units by mouth 2 (two) times daily.     loratadine  (CLARITIN ) 10 MG tablet Take 10 mg by mouth daily.     Melatonin 3 MG TABS Take 3 mg by mouth at bedtime.     Multiple Vitamins-Minerals (PRESERVISION AREDS 2) CAPS Take 1 capsule by mouth 2 (two) times daily.     Omega-3 Fatty Acids (FISH OIL  ULTRA) 1400 MG CAPS Take 1,400 mg by mouth 2 (two) times daily.     Pitavastatin  Calcium  1 MG TABS Take 1 tablet by mouth once daily 90 tablet 1   benzonatate  (TESSALON ) 100 MG capsule Take 1 capsule (100 mg total) by mouth 3 (three) times daily as needed for cough. 21 capsule 0   prednisoLONE acetate (PRED FORTE) 1 % ophthalmic suspension Place 3 drops into the left eye See admin instructions. Instill 3 drop into the left eye on the day of eye injections every 10 to 12 weeks (Patient not taking: Reported on 12/27/2024)     triamcinolone  cream (KENALOG ) 0.1 % Apply 1 Application topically 2 (two) times daily as needed (rash). (Patient not taking: Reported on 12/27/2024)     No facility-administered medications prior to visit.    ROS: Review of Systems  Constitutional:  Negative for activity change, appetite change, chills, fatigue and unexpected weight change.  HENT:  Negative for congestion, mouth sores and sinus pressure.   Eyes:  Negative for visual  disturbance.  Respiratory:  Negative for cough and chest tightness.   Gastrointestinal:  Negative for abdominal pain and nausea.  Genitourinary:  Negative for difficulty urinating, frequency and vaginal pain.  Musculoskeletal:  Negative for back pain and gait problem.  Skin:  Negative for pallor and rash.  Neurological:  Negative for dizziness, tremors, weakness, numbness and headaches.  Psychiatric/Behavioral:  Negative for confusion and sleep disturbance.     Objective:  BP (!) 152/94   Pulse 64   Ht 5' 3 (1.6 m)   Wt 115 lb 9.6 oz (52.4 kg)   SpO2 97%   BMI 20.48 kg/m   BP Readings from Last 3 Encounters:  12/27/24 (!) 152/94  12/07/24 (!) 182/79  07/05/24 (!) 148/70    Wt Readings from Last 3 Encounters:  12/27/24 115 lb 9.6 oz (52.4 kg)  10/26/24 113 lb 9.6 oz (51.5 kg)  07/05/24 120 lb (54.4 kg)    Physical Exam Constitutional:      General: She is not in acute distress.    Appearance: Normal appearance. She is well-developed.  HENT:     Head: Normocephalic.     Right Ear: External ear normal.     Left Ear: External ear normal.     Nose: Nose normal.  Eyes:     General:  Right eye: No discharge.        Left eye: No discharge.     Conjunctiva/sclera: Conjunctivae normal.     Pupils: Pupils are equal, round, and reactive to light.  Neck:     Thyroid : No thyromegaly.     Vascular: No JVD.     Trachea: No tracheal deviation.  Cardiovascular:     Rate and Rhythm: Normal rate and regular rhythm.     Heart sounds: Normal heart sounds.  Pulmonary:     Effort: No respiratory distress.     Breath sounds: No stridor. No wheezing.  Abdominal:     General: Bowel sounds are normal. There is no distension.     Palpations: Abdomen is soft. There is no mass.     Tenderness: There is no abdominal tenderness. There is no guarding or rebound.  Musculoskeletal:        General: No tenderness.     Cervical back: Normal range of motion and neck supple. No rigidity.   Lymphadenopathy:     Cervical: No cervical adenopathy.  Skin:    Findings: No erythema or rash.  Neurological:     Cranial Nerves: No cranial nerve deficit.     Motor: No abnormal muscle tone.     Coordination: Coordination normal.     Deep Tendon Reflexes: Reflexes normal.  Psychiatric:        Behavior: Behavior normal.        Thought Content: Thought content normal.        Judgment: Judgment normal.   Mild bruit B  Lab Results  Component Value Date   WBC 5.1 12/27/2024   HGB 12.4 12/27/2024   HCT 36.1 12/27/2024   PLT 232.0 12/27/2024   GLUCOSE 97 12/27/2024   CHOL 165 12/27/2024   TRIG 84.0 12/27/2024   HDL 71.50 12/27/2024   LDLCALC 76 12/27/2024   ALT 12 12/27/2024   AST 17 12/27/2024   NA 137 12/27/2024   K 4.1 12/27/2024   CL 102 12/27/2024   CREATININE 0.50 12/27/2024   BUN 12 12/27/2024   CO2 30 12/27/2024   TSH 2.55 12/27/2024   INR 1.41 08/07/2015    No results found.  Assessment & Plan:   Problem List Items Addressed This Visit     Dyslipidemia   On Pitavastatin        Relevant Orders   TSH (Completed)   Lipid panel (Completed)   Osteopenia   On Vit D      Insomnia   Zolpidem  for travel  Potential benefits of a short term benzodiazepines  use as well as potential risks  and complications were explained to the patient and were aknowledged.       Relevant Orders   CBC with Differential/Platelet (Completed)   Travel advice encounter   [place] cruise DVT prevention Zolpidem  prn Zpack      Heart block AV second degree   S/p pacemaker placement      Relevant Medications   telmisartan  (MICARDIS ) 20 MG tablet   Other Relevant Orders   Comprehensive metabolic panel with GFR (Completed)   Carotid bruit   Mild B Check US       Relevant Orders   US  Carotid Bilateral   Comprehensive metabolic panel with GFR (Completed)   CBC with Differential/Platelet (Completed)   HTN (hypertension) - Primary   Start Micardis  RTC 3 mo       Relevant Medications   telmisartan  (MICARDIS ) 20 MG tablet  Meds ordered this encounter  Medications   zolpidem  (AMBIEN ) 5 MG tablet    Sig: Take 1 tablet (5 mg total) by mouth at bedtime as needed for sleep.    Dispense:  30 tablet    Refill:  0   azithromycin  (ZITHROMAX  Z-PAK) 250 MG tablet    Sig: As directed    Dispense:  6 tablet    Refill:  0   telmisartan  (MICARDIS ) 20 MG tablet    Sig: Take 1 tablet (20 mg total) by mouth daily.    Dispense:  90 tablet    Refill:  3      Follow-up: Return in about 6 months (around 06/26/2025) for a follow-up visit.  Marolyn Noel, MD "

## 2024-12-27 NOTE — Assessment & Plan Note (Signed)
 [place] DVT prevention Zolpidem  prn Zpack

## 2024-12-27 NOTE — Assessment & Plan Note (Signed)
 S/p pacemaker placement.

## 2024-12-27 NOTE — Assessment & Plan Note (Signed)
 On Vit D

## 2024-12-28 ENCOUNTER — Ambulatory Visit: Payer: Self-pay | Admitting: Internal Medicine

## 2025-01-04 ENCOUNTER — Other Ambulatory Visit: Payer: Medicare (Managed Care)

## 2025-01-14 ENCOUNTER — Other Ambulatory Visit: Payer: Medicare (Managed Care)

## 2025-01-15 ENCOUNTER — Encounter (INDEPENDENT_AMBULATORY_CARE_PROVIDER_SITE_OTHER): Payer: Medicare (Managed Care) | Admitting: Ophthalmology

## 2025-02-08 ENCOUNTER — Ambulatory Visit: Payer: Medicare (Managed Care) | Admitting: Pulmonary Disease

## 2025-02-15 ENCOUNTER — Encounter

## 2025-03-27 ENCOUNTER — Ambulatory Visit: Payer: Medicare (Managed Care) | Admitting: Internal Medicine

## 2025-05-17 ENCOUNTER — Encounter

## 2025-08-16 ENCOUNTER — Encounter

## 2025-11-15 ENCOUNTER — Encounter

## 2026-02-14 ENCOUNTER — Encounter
